# Patient Record
Sex: Male | Born: 1945 | Race: Black or African American | Hispanic: No | Marital: Married | Smoking: Former smoker
Health system: Southern US, Community
[De-identification: ages and names within clinical notes are randomized; demographics above are authoritative.]

## PROBLEM LIST (undated history)

## (undated) DIAGNOSIS — M5126 Other intervertebral disc displacement, lumbar region: Secondary | ICD-10-CM

## (undated) DIAGNOSIS — J309 Allergic rhinitis, unspecified: Secondary | ICD-10-CM

## (undated) DIAGNOSIS — K5904 Chronic idiopathic constipation: Secondary | ICD-10-CM

## (undated) DIAGNOSIS — K029 Dental caries, unspecified: Secondary | ICD-10-CM

## (undated) DIAGNOSIS — E785 Hyperlipidemia, unspecified: Secondary | ICD-10-CM

## (undated) DIAGNOSIS — I10 Essential (primary) hypertension: Secondary | ICD-10-CM

## (undated) DIAGNOSIS — M549 Dorsalgia, unspecified: Secondary | ICD-10-CM

## (undated) DIAGNOSIS — M109 Gout, unspecified: Secondary | ICD-10-CM

## (undated) DIAGNOSIS — M1990 Unspecified osteoarthritis, unspecified site: Secondary | ICD-10-CM

## (undated) DIAGNOSIS — K562 Volvulus: Secondary | ICD-10-CM

## (undated) DIAGNOSIS — I440 Atrioventricular block, first degree: Secondary | ICD-10-CM

## (undated) HISTORY — DX: Atrioventricular block, first degree: I44.0

## (undated) HISTORY — DX: Hyperlipidemia, unspecified: E78.5

## (undated) HISTORY — DX: Essential (primary) hypertension: I10

## (undated) HISTORY — DX: Other intervertebral disc displacement, lumbar region: M51.26

## (undated) HISTORY — DX: Volvulus: K56.2

## (undated) HISTORY — DX: Dental caries, unspecified: K02.9

## (undated) HISTORY — DX: Chronic idiopathic constipation: K59.04

## (undated) HISTORY — DX: Gout, unspecified: M10.9

## (undated) HISTORY — DX: Allergic rhinitis, unspecified: J30.9

## (undated) HISTORY — DX: Dorsalgia, unspecified: M54.9

---

## 2008-06-25 HISTORY — PX: COLONOSCOPY: SHX174

## 2009-06-25 DIAGNOSIS — K562 Volvulus: Secondary | ICD-10-CM

## 2009-06-25 HISTORY — DX: Volvulus: K56.2

## 2009-09-05 ENCOUNTER — Emergency Department: Payer: Self-pay | Admitting: Emergency Medicine

## 2010-01-10 ENCOUNTER — Emergency Department: Payer: Self-pay | Admitting: Emergency Medicine

## 2010-01-11 ENCOUNTER — Emergency Department: Payer: Self-pay | Admitting: Unknown Physician Specialty

## 2010-02-01 ENCOUNTER — Encounter: Payer: Self-pay | Admitting: Family Medicine

## 2010-02-22 ENCOUNTER — Ambulatory Visit: Payer: Self-pay | Admitting: Family Medicine

## 2010-02-23 ENCOUNTER — Encounter: Payer: Self-pay | Admitting: Family Medicine

## 2010-03-25 ENCOUNTER — Encounter: Payer: Self-pay | Admitting: Family Medicine

## 2010-04-25 ENCOUNTER — Encounter: Payer: Self-pay | Admitting: Family Medicine

## 2012-01-31 ENCOUNTER — Emergency Department: Payer: Self-pay | Admitting: Emergency Medicine

## 2012-01-31 LAB — COMPREHENSIVE METABOLIC PANEL
Albumin: 3.7 g/dL (ref 3.4–5.0)
Alkaline Phosphatase: 90 U/L (ref 50–136)
BUN: 24 mg/dL — ABNORMAL HIGH (ref 7–18)
Bilirubin,Total: 0.2 mg/dL (ref 0.2–1.0)
Creatinine: 1.34 mg/dL — ABNORMAL HIGH (ref 0.60–1.30)
EGFR (African American): >60
Glucose: 99 mg/dL (ref 65–99)
Osmolality: 291 (ref 275–301)
Sodium: 144 mmol/L (ref 136–145)
Total Protein: 7.1 g/dL (ref 6.4–8.2)

## 2012-01-31 LAB — CBC
HCT: 38.9 % — ABNORMAL LOW (ref 40.0–52.0)
HGB: 13.3 g/dL (ref 13.0–18.0)
MCV: 88 fL (ref 80–100)
Platelet: 146 10*3/uL — ABNORMAL LOW (ref 150–440)
RBC: 4.41 10*6/uL (ref 4.40–5.90)
WBC: 5.8 10*3/uL (ref 3.8–10.6)

## 2012-01-31 LAB — TROPONIN I: Troponin-I: <0.02 ng/mL

## 2012-01-31 LAB — CK TOTAL AND CKMB (NOT AT ARMC): CK-MB: 1.9 ng/mL (ref 0.5–3.6)

## 2012-02-01 ENCOUNTER — Emergency Department: Payer: Self-pay | Admitting: Emergency Medicine

## 2012-02-01 LAB — CBC
HCT: 40.3 % (ref 40.0–52.0)
HGB: 14 g/dL (ref 13.0–18.0)
MCH: 30.3 pg (ref 26.0–34.0)
MCHC: 34.6 g/dL (ref 32.0–36.0)
Platelet: 149 10*3/uL — ABNORMAL LOW (ref 150–440)
RBC: 4.61 10*6/uL (ref 4.40–5.90)

## 2012-02-01 LAB — TROPONIN I
Troponin-I: <0.02 ng/mL
Troponin-I: <0.02 ng/mL

## 2012-02-01 LAB — CK TOTAL AND CKMB (NOT AT ARMC)
CK, Total: 172 U/L (ref 35–232)
CK-MB: 1.5 ng/mL (ref 0.5–3.6)

## 2012-02-01 LAB — URINALYSIS, COMPLETE
Blood: NEGATIVE
Ketone: NEGATIVE
Leukocyte Esterase: NEGATIVE
Ph: 5 (ref 4.5–8.0)
Protein: NEGATIVE
RBC,UR: 1 /HPF (ref 0–5)

## 2012-02-01 LAB — COMPREHENSIVE METABOLIC PANEL
Anion Gap: 6 — ABNORMAL LOW (ref 7–16)
BUN: 17 mg/dL (ref 7–18)
Calcium, Total: 10.1 mg/dL (ref 8.5–10.1)
Chloride: 105 mmol/L (ref 98–107)
Co2: 33 mmol/L — ABNORMAL HIGH (ref 21–32)
EGFR (African American): >60
EGFR (Non-African Amer.): 55 — ABNORMAL LOW
Potassium: 4.2 mmol/L (ref 3.5–5.1)
SGOT(AST): 26 U/L (ref 15–37)
SGPT (ALT): 30 U/L (ref 12–78)

## 2012-02-01 LAB — PRO B NATRIURETIC PEPTIDE: B-Type Natriuretic Peptide: 55 pg/mL (ref 0–125)

## 2012-02-04 ENCOUNTER — Encounter: Payer: Self-pay | Admitting: Cardiovascular Disease

## 2012-02-05 ENCOUNTER — Encounter: Payer: Self-pay | Admitting: Cardiovascular Disease

## 2012-02-07 ENCOUNTER — Encounter: Payer: Self-pay | Admitting: Cardiovascular Disease

## 2012-04-23 ENCOUNTER — Inpatient Hospital Stay: Payer: Self-pay | Admitting: Internal Medicine

## 2012-04-23 LAB — COMPREHENSIVE METABOLIC PANEL
Albumin: 3.8 g/dL (ref 3.4–5.0)
Alkaline Phosphatase: 89 U/L (ref 50–136)
Anion Gap: 7 (ref 7–16)
BUN: 22 mg/dL — ABNORMAL HIGH (ref 7–18)
Calcium, Total: 8.7 mg/dL (ref 8.5–10.1)
Creatinine: 1.28 mg/dL (ref 0.60–1.30)
EGFR (African American): >60
EGFR (Non-African Amer.): 58 — ABNORMAL LOW
Glucose: 170 mg/dL — ABNORMAL HIGH (ref 65–99)
Osmolality: 290 (ref 275–301)
Potassium: 4.4 mmol/L (ref 3.5–5.1)
Sodium: 142 mmol/L (ref 136–145)
Total Protein: 7.3 g/dL (ref 6.4–8.2)

## 2012-04-23 LAB — CBC WITH DIFFERENTIAL/PLATELET
Basophil #: 0.1 10*3/uL (ref 0.0–0.1)
Basophil %: 0.6 %
HGB: 13.2 g/dL (ref 13.0–18.0)
Lymphocyte #: 1.2 10*3/uL (ref 1.0–3.6)
MCV: 90 fL (ref 80–100)
Monocyte #: 1.1 x10 3/mm — ABNORMAL HIGH (ref 0.2–1.0)
Monocyte %: 11.3 %
Platelet: 132 10*3/uL — ABNORMAL LOW (ref 150–440)
RDW: 13.9 % (ref 11.5–14.5)
WBC: 9.7 10*3/uL (ref 3.8–10.6)

## 2012-04-23 LAB — HEMOGLOBIN A1C: Hemoglobin A1C: 6.9 % — ABNORMAL HIGH (ref 4.2–6.3)

## 2012-04-24 LAB — CBC WITH DIFFERENTIAL/PLATELET
Basophil #: 0 10*3/uL (ref 0.0–0.1)
Basophil %: 0.3 %
Eosinophil %: 2.7 %
HCT: 37.7 % — ABNORMAL LOW (ref 40.0–52.0)
Lymphocyte #: 0.8 10*3/uL — ABNORMAL LOW (ref 1.0–3.6)
MCH: 30 pg (ref 26.0–34.0)
MCHC: 33.8 g/dL (ref 32.0–36.0)
MCV: 89 fL (ref 80–100)
Monocyte %: 12.4 %
Neutrophil #: 5.6 10*3/uL (ref 1.4–6.5)
RBC: 4.25 10*6/uL — ABNORMAL LOW (ref 4.40–5.90)
RDW: 14.1 % (ref 11.5–14.5)

## 2012-04-28 LAB — CULTURE, BLOOD (SINGLE)

## 2013-10-14 ENCOUNTER — Encounter: Payer: Self-pay | Admitting: Family Medicine

## 2013-10-23 ENCOUNTER — Encounter: Payer: Self-pay | Admitting: Family Medicine

## 2013-11-23 ENCOUNTER — Encounter: Payer: Self-pay | Admitting: Family Medicine

## 2014-10-12 NOTE — Consult Note (Signed)
PATIENT NAME:  Dillon Schultz, Dillon Schultz MR#:  [id number] DATE OF BIRTH:  02/12/1946  DATE OF CONSULTATION:  04/23/2012  CONSULTING PHYSICIAN:  Laurene Footman, MD  REASON FOR CONSULTATION: Left finger infection.   HISTORY OF PRESENT ILLNESS: The patient is a 69 year old who has been having increasing pain in the left hand. He had what he thinks was a bug bite. He had swelling to the finger. He got worse despite oral antibiotics and came into the Emergency Room where he was found to have significant swelling and increasing stiffness. He was admitted for this today. He is seen in consult in case he needs surgical intervention.   PHYSICAL EXAMINATION: On exam he holds the finger in slight flexion about 30 degrees flexion at the PIP joint. He said he could straighten the finger fully prior to this infection. He has an approximately 3 to 4 mm area that appears to be a prior bug bite or puncture wound to the dorsal ulnar aspect of the ring finger close to the PIP joint. There is moderate swelling to the finger. He can be passively flexed and extended without a great deal of pain up until the point where in flexion and extension he has a great deal of pain. Sensation is intact.   RADIOLOGICAL DATA: X-rays were ordered and interpreted. AP, lateral, oblique of the left ring finger shows no evidence of foreign body or osteomyelitis.   CLINICAL IMPRESSION: Cellulitis left ring finger.  RECOMMENDATIONS: I agree with current antibiotics. If he develops abscess or flexor tendon sheath infection, he may require incision and drainage and irrigation of any abscess that might form; but clinically at this time there is no area of fluctuance that could be drained, and there is just cellulitis.   I discussed this with the patient. He is very anxious about potential loss of finger. I told him that with appropriate antibiotics and surgical care, if needed, that should not be necessary. I will continue to follow him during his  hospitalization.  ____________________________ Laurene Footman, MD [id number] D: 04/23/2012 16:48:07 ET T: 04/23/2012 16:56:34 ET JOB#: [id number]  Christian Mate Galaxy Borden MD ELECTRONICALLY SIGNED 04/23/2012 21:12

## 2014-10-12 NOTE — Consult Note (Signed)
Brief Consult Note: Diagnosis: cellulitis left ring finger.   Patient was seen by consultant.   Orders entered.   Comments: xray ordered ot make sure there is no foreign body. will continiue to follwo, may need incision and drainage if abscess forms.  Electronic Signatures: Laurene Footman (MD)  (Signed 30-Oct-13 12:50)  Authored: Brief Consult Note   Last Updated: 30-Oct-13 12:50 by Laurene Footman (MD)

## 2014-10-12 NOTE — H&P (Signed)
PATIENT NAME:  Dillon, Schultz MR#:  [id number] DATE OF BIRTH:  [birth year]  DATE OF ADMISSION:  04/23/2012  PRIMARY CARE PROVIDER: Dr. Salome Holmes   CHIEF COMPLAINT: Left fourth finger swelling and erythema.   HISTORY OF PRESENT ILLNESS: The patient is a 69 year old male with history of hypertension, hyperlipidemia, question of borderline diabetes mellitus who presents with left fourth finger swelling and erythema. The patient was in his usual state of health until two days prior to presentation when he noticed a small lesion on his left fourth finger he presumed to be an insect bite. The area progressively became more erythematous and swollen so he presented to the his primary care provider and was started on clindamycin in the outpatient setting. Despite antibiotics, the finger continued to swell and became significantly erythematous and painful so the patient presented to the Emergency Department. He has received a dose of vancomycin thus far. He denies any fevers or chills. He notes that intermittently from the wound he is only able to express some clear fluid. No frank purulent material has been expressed from the site.   PAST MEDICAL HISTORY:  1. 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  2. Hyperlipidemia.  3. Hypertension.  4. Gout.  5. Chronic venostasis.   PAST SURGICAL HISTORY: Denies.   ALLERGIES: No known drug allergies.   HOME MEDICATIONS:  1. Amlodipine 10 mg daily.  2. Crestor 10 mg daily at bedtime.  3. Ibuprofen 800 mg 3 times a day.  4. Indomethacin 50 mg daily (the patient takes this as needed. He notes that his gout exacerbations are about once or twice yearly).  5. Lasix 20 mg daily.  6. Lisinopril 40 mg daily.   FAMILY HISTORY: Father had unknown cancer. There is also a family history of diabetes and hypertension.   SOCIAL HISTORY: Denies tobacco or illicit drug use or alcohol. He is retired from a [employer] in [place], [place].   [place]:  CONSTITUTIONAL: Denies fever, fatigue. EYES: Denies blurred vision or eye pain. ENT: Denies tinnitus or ear pain. RESPIRATORY: Denies cough, dyspnea. CARDIOVASCULAR: Denies chest pain. Admits to chronic lower extremity edema. Denies any palpitations. GI: Denies nausea, vomiting, abdominal pain, or melena. GU: Denies dysuria, frequency. INTEGUMENTARY: Admits to new lesion on left fourth finger. MUSCULOSKELETAL: Admits to arthritis and gout. NEUROLOGICAL: Denies numbness or headache. PSYCH: Denies anxiety or depression.   PHYSICAL EXAMINATION:   VITAL SIGNS: Temperature 98.1, pulse 88, respirations 20, blood pressure 129/79, sating 95% on room air.   GENERAL: Elderly African American male resting comfortably on the stretcher in no apparent distress.   EYES: There is arcus senilis. Anicteric sclerae. Pupils equal, round, and reactive to light.   ENT: Normal external ears and nares. Posterior pharynx is clear without erythema or exudate.   CARDIAC: S1, S2, regular rate and rhythm. No murmurs. There is 1 to 2+ pitting edema in bilateral lower extremities. This is chronic. Pulses are equal bilaterally.   RESPIRATORY: Clear to auscultation bilaterally. No wheeze, rales, or rhonchi. Normal effort.   ABDOMEN: Soft, nontender, nondistended. Rotunded. No hepatosplenomegaly noted.   MUSCULOSKELETAL: Full range of motion in all extremities. No clubbing or cyanosis.   SKIN: Warm and dry.There is marked tenderness, swelling, and erythema over the left fourth finger extending from the MCP to the DIP. There is some erythema on the palmar aspect. There is no lymphadenitis in the hand. Sensation is intact. There is no fluctuance. The finger is significantly swollen.   PSYCH: The patient is alert  and oriented x3. Judgment is intact.   LABORATORY DATA: Glucose 170, BUN 22, creatinine 1.28, sodium 142, potassium 4.4, chloride 105, bicarb 30, anion gap 7, osmolality 290 with a calcium of 8.7, total protein 7.3,  albumin 3.8, bilirubin 0.3, alkaline phosphatase 89, AST 29, ALT 32, WBC count 9.7, hemoglobin 13.2, hematocrit 39.2, platelets 132, MCV 90.   ASSESSMENT AND PLAN: This is a 69 year old male with history of hypertension, hyperlipidemia, and possible borderline diabetes mellitus presenting with left finger cellulitis refractory to outpatient antibiotics.  1. Left fourth finger cellulitis. The patient has received vancomycin in the Emergency Department. Will continue vancomycin with clindamycin to cover for any anaerobic agents as well as Streptococcus. Will check blood and wound cultures. Continue p.r.n. pain medications. I think it would be reasonable to obtain an orthopedic consult in case incision and drainage is needed. At this time the wound is indurated but not fluctuant and I was unable to express any purulent material. Will check a hemoglobin A1c and if uncontrolled will initiate hyperglycemic medications.  2. Hypertension, stable. Will continue his home medications of lisinopril and Amlodipine.  3. Chronic venostasis. Will continue his Lasix.  4. Gout, stable at this time. Will hold his indomethacin. He does not take his routinely.  5. Hyperlipidemia. Will continue his statin.  6. CODE STATUS. FULL. His surrogate decision maker is his wife, Dillon Schultz, or his son, Dillon Schultz.  7. Prophylaxis. Lovenox.   DISPOSITION: The patient is being admitted inpatient for intravenous antibiotics for his cellulitis.   TIME SPENT ON ADMISSION: 40 minutes.   ____________________________ Samson Frederic, DO aeo:drc D: 04/23/2012 05:29:02 ET T: 04/23/2012 07:22:27 ET JOB#: [id number]  cc: Samson Frederic, DO, <Dictator>, Salome Holmes, MD [place] SIGNED 04/27/2012 6:39

## 2014-10-12 NOTE — Discharge Summary (Signed)
PATIENT NAME:  Dillon Schultz, CREPEAU MR#:  [id number] DATE OF BIRTH:  1946/01/21  DATE OF ADMISSION:  04/23/2012 DATE OF DISCHARGE:  04/24/2012  CONSULTATION: Dr. Rudene Christians.  DISCHARGE DIAGNOSES:  1. Left fourth finger cellulitis, questionable abscess.  2. Hypertension.  3. Diabetes.  4. Hyperlipidemia.   CONDITION: Stable.   CODE STATUS: FULL CODE.   HOME MEDICATIONS:  1. Lisinopril 40 mg p.o. daily.  2. Crestor 10 mg p.o. at bedtime. 3. Lasix 20 mg p.o. daily.  4. Norvasc 10 mg p.o. daily.  5. Ibuprofen 800 mg p.o. 3 times daily p.r.n.  6. Clindamycin 300 mg capsule 2 caps every eight hours for 10 days.  STOP MEDICATION: Indomethacin 50 mg p.o. daily.   DIET: Low sodium, ADA diet.   ACTIVITY: As tolerated.   FOLLOW-UP CARE: Follow up with PCP within 1 to 2 weeks. Follow up Dr. Rudene Christians within 1 week.   REASON FOR ADMISSION: Left fourth finger swelling and erythema.   HOSPITAL COURSE: Patient is a 69 year old African American male with history of hypertension, hyperlipidemia, questionable borderline diabetes presented to ED with left fourth finger swelling and erythema. He was treated with clindamycin as outpatient. Despite antibiotics the finger continued to swell and became significant erythematosus and painful so patient came to ED for further evaluation. He received vancomycin and admitted for cellulitis. After admission patient has been treated with vancomycin and clindamycin IVPB. Blood culture so far is negative. Dr. Rudene Christians evaluated patient, suggested continue IV antibiotics. Patient may need incision and drainage but later, but not at this time. Patient's finger x-ray showed no evidence of acute abnormality. Patient really wants to go home today. I advised the patient follow up with PCP and Dr. Rudene Christians and come to ED if the symptoms get worse. Patient's hemoglobin is 6.9. Patient will be discharged home today. Discussed the patient's discharge plan with the patient and the case manager.   TIME  SPENT: About 33 minutes.   ____________________________ Demetrios Loll, MD qc:cms D: 04/24/2012 16:14:31 ET T: 04/25/2012 08:25:46 ET JOB#: [id number]  cc: Demetrios Loll, MD, <Dictator> Demetrios Loll MD ELECTRONICALLY SIGNED 04/30/2012 16:51

## 2015-11-11 ENCOUNTER — Other Ambulatory Visit: Payer: Self-pay

## 2015-11-11 ENCOUNTER — Telehealth: Payer: Self-pay

## 2015-11-11 MED ORDER — PEG 3350-KCL-NABCB-NACL-NASULF 236 G PO SOLR: 4000.0000 mL | Freq: Once | ORAL | Status: DC

## 2015-11-11 NOTE — Telephone Encounter (Signed)
Gastroenterology Pre-Procedure Review  Request Date: 12/16/15 Requesting Physician: Dr. Clide Deutscher - Princella Ion   PATIENT REVIEW QUESTIONS: The patient responded to the following health history questions as indicated:    1. Are you having any GI issues? no 2. Do you have a personal history of Polyps? no 3. Do you have a family history of Colon Cancer or Polyps? no 4. Diabetes Mellitus? no 5. Joint replacements in the past 12 months?no 6. Major health problems in the past 3 months?no 7. Any artificial heart valves, MVP, or defibrillator?no    MEDICATIONS & ALLERGIES:    Patient reports the following regarding taking any anticoagulation/antiplatelet therapy:   Plavix, Coumadin, Eliquis, Xarelto, Lovenox, Pradaxa, Brilinta, or Effient? no Aspirin? no  Patient confirms/reports the following medications:  Current Outpatient Prescriptions  Medication Sig Dispense Refill  . amLODipine (NORVASC) 10 MG tablet Take 10 mg by mouth daily.    Marland Kitchen azelastine (ASTELIN) 137 MCG/SPRAY nasal spray Place 2 sprays into the nose 2 (two) times daily. Use in each nostril as directed    . cetirizine (ZYRTEC) 10 MG tablet Take 10 mg by mouth daily.    . furosemide (LASIX) 20 MG tablet Take 20 mg by mouth daily.    Marland Kitchen lisinopril (PRINIVIL,ZESTRIL) 40 MG tablet Takes 2 tablets daily.     No current facility-administered medications for this visit.    Patient confirms/reports the following allergies:  Allergies  Allergen Reactions  . Glucophage [Metformin Hcl]     No orders of the defined types were placed in this encounter.    AUTHORIZATION INFORMATION Primary Insurance: 1D#: Group #:  Secondary Insurance: 1D#: Group #:  SCHEDULE INFORMATION: Date: 12/16/15 Time: Location: [facility]

## 2015-11-25 ENCOUNTER — Encounter: Payer: Self-pay | Admitting: Emergency Medicine

## 2015-11-25 ENCOUNTER — Emergency Department
Admission: EM | Admit: 2015-11-25 | Discharge: 2015-11-25 | Disposition: A | Discharge status: Home / Self Care | Payer: Medicare Other | Attending: Emergency Medicine | Admitting: Emergency Medicine

## 2015-11-25 DIAGNOSIS — Z87891 Personal history of nicotine dependence: Secondary | ICD-10-CM | POA: Diagnosis not present

## 2015-11-25 DIAGNOSIS — Z79899 Other long term (current) drug therapy: Secondary | ICD-10-CM | POA: Diagnosis not present

## 2015-11-25 DIAGNOSIS — K439 Ventral hernia without obstruction or gangrene: Secondary | ICD-10-CM | POA: Diagnosis not present

## 2015-11-25 DIAGNOSIS — E785 Hyperlipidemia, unspecified: Secondary | ICD-10-CM | POA: Insufficient documentation

## 2015-11-25 DIAGNOSIS — I10 Essential (primary) hypertension: Secondary | ICD-10-CM | POA: Insufficient documentation

## 2015-11-25 DIAGNOSIS — M5126 Other intervertebral disc displacement, lumbar region: Secondary | ICD-10-CM | POA: Insufficient documentation

## 2015-11-25 DIAGNOSIS — R1033 Periumbilical pain: Secondary | ICD-10-CM | POA: Diagnosis present

## 2015-11-25 LAB — CBC
HEMATOCRIT: 38.5 % — AB (ref 40.0–52.0)
HEMOGLOBIN: 12.9 g/dL — AB (ref 13.0–18.0)
MCH: 29.5 pg (ref 26.0–34.0)
MCHC: 33.5 g/dL (ref 32.0–36.0)
MCV: 88 fL (ref 80.0–100.0)
Platelets: 147 10*3/uL — ABNORMAL LOW (ref 150–440)
RBC: 4.38 MIL/uL — AB (ref 4.40–5.90)
RDW: 13.5 % (ref 11.5–14.5)
WBC: 5.7 10*3/uL (ref 3.8–10.6)

## 2015-11-25 LAB — COMPREHENSIVE METABOLIC PANEL
ALT: 12 U/L — AB (ref 17–63)
AST: 20 U/L (ref 15–41)
Albumin: 4.1 g/dL (ref 3.5–5.0)
Alkaline Phosphatase: 57 U/L (ref 38–126)
Anion gap: 9 (ref 5–15)
BUN: 23 mg/dL — ABNORMAL HIGH (ref 6–20)
CHLORIDE: 104 mmol/L (ref 101–111)
CO2: 28 mmol/L (ref 22–32)
CREATININE: 1.36 mg/dL — AB (ref 0.61–1.24)
Calcium: 9.5 mg/dL (ref 8.9–10.3)
GFR calc non Af Amer: 51 mL/min — ABNORMAL LOW (ref >60)
GFR, EST AFRICAN AMERICAN: 59 mL/min — AB (ref >60)
Glucose, Bld: 103 mg/dL — ABNORMAL HIGH (ref 65–99)
POTASSIUM: 3.6 mmol/L (ref 3.5–5.1)
SODIUM: 141 mmol/L (ref 135–145)
Total Bilirubin: 0.5 mg/dL (ref 0.3–1.2)
Total Protein: 7.2 g/dL (ref 6.5–8.1)

## 2015-11-25 LAB — LIPASE, BLOOD: LIPASE: 25 U/L (ref 11–51)

## 2015-11-25 MED ORDER — DOCUSATE SODIUM 100 MG PO CAPS: 200.0000 mg | ORAL_CAPSULE | Freq: Two times a day (BID) | ORAL | Status: DC

## 2015-11-25 NOTE — Discharge Instructions (Signed)
Hernia, Adult °A hernia is the bulging of an organ or tissue through a weak spot in the muscles of the abdomen (abdominal wall). Hernias develop most often near the navel or groin. °There are many kinds of hernias. Common kinds include: °· Femoral hernia. This kind of hernia develops under the groin in the upper thigh area. °· Inguinal hernia. This kind of hernia develops in the groin or scrotum. °· Umbilical hernia. This kind of hernia develops near the navel. °· Hiatal hernia. This kind of hernia causes part of the stomach to be pushed up into the chest. °· Incisional hernia. This kind of hernia bulges through a scar from an abdominal surgery. °CAUSES °This condition may be caused by: °· Heavy lifting. °· Coughing over a long period of time. °· Straining to have a bowel movement. °· An incision made during an abdominal surgery. °· A birth defect (congenital defect). °· Excess weight or obesity. °· Smoking. °· Poor nutrition. °· Cystic fibrosis. °· Excess fluid in the abdomen. °· Undescended testicles. °SYMPTOMS °Symptoms of a hernia include: °· A lump on the abdomen. This is the first sign of a hernia. The lump may become more obvious with standing, straining, or coughing. It may get bigger over time if it is not treated or if the condition causing it is not treated. °· Pain. A hernia is usually painless, but it may become painful over time if treatment is delayed. The pain is usually dull and may get worse with standing or lifting heavy objects. °Sometimes a hernia gets tightly squeezed in the weak spot (strangulated) or stuck there (incarcerated) and causes additional symptoms. These symptoms may include: °· Vomiting. °· Nausea. °· Constipation. °· Irritability. °DIAGNOSIS °A hernia may be diagnosed with: °· A physical exam. During the exam your health care provider may ask you to cough or to make a specific movement, because a hernia is usually more visible when you move. °· Imaging tests. These can  include: °· X-rays. °· Ultrasound. °· CT scan. °TREATMENT °A hernia that is small and painless may not need to be treated. A hernia that is large or painful may be treated with surgery. Inguinal hernias may be treated with surgery to prevent incarceration or strangulation. Strangulated hernias are always treated with surgery, because lack of blood to the trapped organ or tissue can cause it to die. °Surgery to treat a hernia involves pushing the bulge back into place and repairing the weak part of the abdomen. °HOME CARE INSTRUCTIONS °· Avoid straining. °· Do not lift anything heavier than 10 lb (4.5 kg). °· Lift with your leg muscles, not your back muscles. This helps avoid strain. °· When coughing, try to cough gently. °· Prevent constipation. Constipation leads to straining with bowel movements, which can make a hernia worse or cause a hernia repair to break down. You can prevent constipation by: °· Eating a high-fiber diet that includes plenty of fruits and vegetables. °· Drinking enough fluids to keep your urine clear or pale yellow. Aim to drink 6-8 glasses of water per day. °· Using a stool softener as directed by your health care provider. °· Lose weight, if you are overweight. °· Do not use any tobacco products, including cigarettes, chewing tobacco, or electronic cigarettes. If you need help quitting, ask your health care provider. °· Keep all follow-up visits as directed by your health care provider. This is important. Your health care provider may need to monitor your condition. °SEEK MEDICAL CARE IF: °· You have   swelling, redness, and pain in the affected area. °· Your bowel habits change. °SEEK IMMEDIATE MEDICAL CARE IF: °· You have a fever. °· You have abdominal pain that is getting worse. °· You feel nauseous or you vomit. °· You cannot push the hernia back in place by gently pressing on it while you are lying down. °· The hernia: °¨ Changes in shape or size. °¨ Is stuck outside the  abdomen. °¨ Becomes discolored. °¨ Feels hard or tender. °  °This information is not intended to replace advice given to you by your health care provider. Make sure you discuss any questions you have with your health care provider. °  °Document Released: 06/11/2005 Document Revised: 07/02/2014 Document Reviewed: 04/21/2014 °Elsevier Interactive Patient Education ©2016 [employer] °Ventral Hernia °A ventral hernia (also called an incisional hernia) is a hernia that occurs at the site of a previous surgical cut (incision) in the abdomen. The abdominal wall spans from your lower chest down to your pelvis. If the abdominal wall is weakened from a surgical incision, a hernia can occur. A hernia is a bulge of bowel or muscle tissue pushing out on the weakened part of the abdominal wall. Ventral hernias can get bigger from straining or lifting. °Obese and older people are at higher risk for a ventral hernia. People who develop infections after surgery or require repeat incisions at the same site on the abdomen are also at increased risk. °CAUSES  °A ventral hernia occurs because of weakness in the abdominal wall at an incision site.  °SYMPTOMS  °Common symptoms include: °· A visible bulge or lump on the abdominal wall. °· Pain or tenderness around the lump. °· Increased discomfort if you cough or make a sudden movement. °If the hernia has blocked part of the intestine, a serious complication can occur (incarcerated or strangulated hernia). This can become a problem that requires emergency surgery because the blood flow to the blocked intestine may be cut off. Symptoms may include: °· Feeling sick to your stomach (nauseous). °· Throwing up (vomiting). °· Stomach swelling (distention) or bloating. °· Fever. °· Rapid heartbeat. °DIAGNOSIS  °Your health care provider will take a medical history and perform a physical exam. Various tests may be ordered, such as: °· Blood tests. °· Urine  tests. °· Ultrasonography. °· X-rays. °· Computed tomography (CT). °TREATMENT  °Watchful waiting may be all that is needed for a smaller hernia that does not cause symptoms. Your health care provider may recommend the use of a supportive belt (truss) that helps to keep the abdominal wall intact. For larger hernias or those that cause pain, surgery to repair the hernia is usually recommended. If a hernia becomes strangulated, emergency surgery needs to be done right away. °HOME CARE INSTRUCTIONS °· Avoid putting pressure or strain on the abdominal area. °· Avoid heavy lifting. °· Use good body positioning for physical tasks. Ask your health care provider about proper body positioning. °· Use a supportive belt as directed by your health care provider. °· Maintain a healthy weight. °· Eat foods that are high in fiber, such as whole grains, fruits, and vegetables. Fiber helps prevent difficult bowel movements (constipation). °· Drink enough fluids to keep your urine clear or pale yellow. °· Follow up with your health care provider as directed. °SEEK MEDICAL CARE IF:  °· Your hernia seems to be getting larger or more painful. °SEEK IMMEDIATE MEDICAL CARE IF:  °· You have abdominal pain that is sudden and sharp. °· Your pain becomes   severe. °· You have repeated vomiting. °· You are sweating a lot. °· You notice a rapid heartbeat. °· You develop a fever. °MAKE SURE YOU:  °· Understand these instructions. °· Will watch your condition. °· Will get help right away if you are not doing well or get worse. °  °This information is not intended to replace advice given to you by your health care provider. Make sure you discuss any questions you have with your health care provider. °  °Document Released: 05/28/2012 Document Revised: 07/02/2014 Document Reviewed: 05/28/2012 °Elsevier Interactive Patient Education ©2016 [employer] ° °

## 2015-11-25 NOTE — ED Provider Notes (Signed)
[facility] Emergency Department Provider Note  ____________________________________________  Time seen: 6:40 PM  I have reviewed the triage vital signs and the nursing notes.   HISTORY  Chief Complaint Abdominal Pain    HPI Dillon Schultz is a 70 y.o. male who is awake and in the middle night by severe abdominal pain the middle near his umbilicus. This been constant all day since about 1 AM. Has been able to eat. No constipation. No vomiting. Never had anything like this before. Noticed a bulge in the abdominal wall. Nonradiating, severe, waxing and waning. Currently moderate intensity. Worse with movement.     Past Medical History  Diagnosis Date  . Hyperlipidemia   . Hypertension   . Gout   . Lumbar herniated disc   . 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 Functional constipation   . 1St degree AV block   . Back pain   . Dental caries   . Allergic rhinitis, cause unspecified      There are no active problems to display for this patient.    Past Surgical History  Procedure Laterality Date  . Colonoscopy  2010     Current Outpatient Rx  Name  Route  Sig  Dispense  Refill  . amLODipine (NORVASC) 10 MG tablet   Oral   Take 10 mg by mouth daily.         Marland Kitchen azelastine (ASTELIN) 137 MCG/SPRAY nasal spray   Nasal   Place 2 sprays into the nose 2 (two) times daily. Use in each nostril as directed         . cetirizine (ZYRTEC) 10 MG tablet   Oral   Take 10 mg by mouth daily.         Marland Kitchen docusate sodium (COLACE) 100 MG capsule   Oral   Take 2 capsules (200 mg total) by mouth 2 (two) times daily.   120 capsule   0   . furosemide (LASIX) 20 MG tablet   Oral   Take 20 mg by mouth daily.         Marland Kitchen lisinopril (PRINIVIL,ZESTRIL) 40 MG tablet      Takes 2 tablets daily.         . polyethylene glycol (GOLYTELY) 236 g solution   Oral   Take 4,000 mLs by mouth once. Drink one 8 oz glass every 30 mins until stools are clear.   4000 mL   0       Allergies Glucophage   History reviewed. No pertinent family history.  Social History Social History  Substance Use Topics  . Smoking status: Former Smoker -- 5 years    Types: Cigarettes    Quit date: 06/26/1967  . Smokeless tobacco: None  . Alcohol Use: None    Review of Systems  Constitutional:   No fever or chills.  Eyes:   No vision changes.  ENT:   No sore throat. No rhinorrhea. Cardiovascular:   No chest pain. Respiratory:   No dyspnea or cough. Gastrointestinal:   Positive for abdominal pain as above without vomiting and diarrhea.  Genitourinary:   Negative for dysuria or difficulty urinating. Musculoskeletal:   Negative for focal pain or swelling Neurological:   Negative for headaches 10-point ROS otherwise negative.  ____________________________________________   PHYSICAL EXAM:  VITAL SIGNS: ED Triage Vitals  Enc Vitals Group     BP 11/25/15 1549 98/65 mmHg     Pulse Rate 11/25/15 1549 76     Resp 11/25/15 1549 18  Temp 11/25/15 1549 98.1 F (36.7 C)     Temp Source 11/25/15 1549 Oral     SpO2 11/25/15 1549 98 %     Weight 11/25/15 1549 206 lb (93.441 kg)     Height 11/25/15 1549 5\' 7"  (1.702 m)     Head Cir --      Peak Flow --      Pain Score 11/25/15 1550 9     Pain Loc --      Pain Edu? --      Excl. in [place]? --     Vital signs reviewed, nursing assessments reviewed.   Constitutional:   Alert and oriented. Well appearing and in no distress. Eyes:   No scleral icterus. No conjunctival pallor. PERRL. EOMI.  No nystagmus. ENT   Head:   Normocephalic and atraumatic.   Nose:   No congestion/rhinnorhea. No septal hematoma   Mouth/Throat:   MMM, no pharyngeal erythema. No peritonsillar mass.    Neck:   No stridor. No SubQ emphysema. No meningismus. Hematological/Lymphatic/Immunilogical:   No cervical lymphadenopathy. Cardiovascular:   RRR. Symmetric bilateral radial and DP pulses.  No murmurs.  Respiratory:   Normal  respiratory effort without tachypnea nor retractions. Breath sounds are clear and equal bilaterally. No wheezes/rales/rhonchi. Gastrointestinal:   Soft With 2 cm hernia sac just superior to the umbilicus, easily reducible. This relieved the patient's pain. Otherwise abdomen is nontender. Non distended. There is no CVA tenderness.  No rebound, rigidity, or guarding. After reducing the hernia there is a 1-2 cm fascial defect in the midline just superior to the umbilicus. Genitourinary:   deferred Musculoskeletal:   Nontender with normal range of motion in all extremities. No joint effusions.  No lower extremity tenderness.  No edema. Neurologic:   Normal speech and language.  CN 2-10 normal. Motor grossly intact. No gross focal neurologic deficits are appreciated.  Skin:    Skin is warm, dry and intact. No rash noted.  No petechiae, purpura, or bullae.  ____________________________________________    LABS (pertinent positives/negatives) (all labs ordered are listed, but only abnormal results are displayed) Labs Reviewed  COMPREHENSIVE METABOLIC PANEL - Abnormal; Notable for the following:    Glucose, Bld 103 (*)    BUN 23 (*)    Creatinine, Ser 1.36 (*)    ALT 12 (*)    GFR calc non Af Amer 51 (*)    GFR calc Af Amer 59 (*)    All other components within normal limits  CBC - Abnormal; Notable for the following:    RBC 4.38 (*)    Hemoglobin 12.9 (*)    HCT 38.5 (*)    Platelets 147 (*)    All other components within normal limits  LIPASE, BLOOD  URINALYSIS COMPLETEWITH MICROSCOPIC ([facility] ONLY)   ____________________________________________   EKG    ____________________________________________    RADIOLOGY    ____________________________________________   PROCEDURES Ventral hernia reduction by me at bedside, from pressure, easily reduced, no complications.  ____________________________________________   INITIAL IMPRESSION / ASSESSMENT AND PLAN / ED  COURSE  Pertinent labs & imaging results that were available during my care of the patient were reviewed by me and considered in my medical decision making (see chart for details).  Patient well appearing no acute distress. Presents with a small ventral hernia, easily reduced. This is the first time this is ever happened. We'll discharge home, started on stool softener, follow up with general surgery clinic. Return precautions for worsening hernia severe pain  vomiting or irreducible mass.     ____________________________________________   FINAL CLINICAL IMPRESSION(S) / ED DIAGNOSES  Final diagnoses:  Ventral hernia, recurrence not specified       Portions of this note were generated with dragon dictation software. Dictation errors may occur despite best attempts at proofreading.   Carrie Mew, MD 11/25/15 [phone]

## 2015-11-25 NOTE — ED Notes (Signed)
Patient c/o pain to the umbilical area starting yesterday

## 2015-12-15 NOTE — Discharge Instructions (Signed)

## 2015-12-16 ENCOUNTER — Encounter
Admission: RE | Disposition: A | Discharge status: Home / Self Care | Payer: Self-pay | Source: Ambulatory Visit | Attending: Gastroenterology

## 2015-12-16 ENCOUNTER — Ambulatory Visit: Payer: Medicare Other | Admitting: Anesthesiology

## 2015-12-16 ENCOUNTER — Ambulatory Visit
Admission: RE | Admit: 2015-12-16 | Discharge: 2015-12-16 | Disposition: A | Discharge status: Home / Self Care | Payer: Medicare Other | Source: Ambulatory Visit | Attending: Gastroenterology | Admitting: Gastroenterology

## 2015-12-16 DIAGNOSIS — D122 Benign neoplasm of ascending colon: Secondary | ICD-10-CM | POA: Diagnosis not present

## 2015-12-16 DIAGNOSIS — Z1211 Encounter for screening for malignant neoplasm of colon: Secondary | ICD-10-CM | POA: Diagnosis not present

## 2015-12-16 DIAGNOSIS — D120 Benign neoplasm of cecum: Secondary | ICD-10-CM | POA: Insufficient documentation

## 2015-12-16 DIAGNOSIS — D123 Benign neoplasm of transverse colon: Secondary | ICD-10-CM | POA: Diagnosis not present

## 2015-12-16 DIAGNOSIS — I10 Essential (primary) hypertension: Secondary | ICD-10-CM | POA: Diagnosis not present

## 2015-12-16 DIAGNOSIS — E785 Hyperlipidemia, unspecified: Secondary | ICD-10-CM | POA: Insufficient documentation

## 2015-12-16 DIAGNOSIS — M1990 Unspecified osteoarthritis, unspecified site: Secondary | ICD-10-CM | POA: Diagnosis not present

## 2015-12-16 DIAGNOSIS — D124 Benign neoplasm of descending colon: Secondary | ICD-10-CM | POA: Diagnosis not present

## 2015-12-16 DIAGNOSIS — K635 Polyp of colon: Secondary | ICD-10-CM | POA: Insufficient documentation

## 2015-12-16 DIAGNOSIS — Z79899 Other long term (current) drug therapy: Secondary | ICD-10-CM | POA: Diagnosis not present

## 2015-12-16 DIAGNOSIS — D125 Benign neoplasm of sigmoid colon: Secondary | ICD-10-CM | POA: Diagnosis not present

## 2015-12-16 DIAGNOSIS — Z87891 Personal history of nicotine dependence: Secondary | ICD-10-CM | POA: Diagnosis not present

## 2015-12-16 DIAGNOSIS — K5730 Diverticulosis of large intestine without perforation or abscess without bleeding: Secondary | ICD-10-CM | POA: Diagnosis not present

## 2015-12-16 DIAGNOSIS — Z7189 Other specified counseling: Secondary | ICD-10-CM | POA: Insufficient documentation

## 2015-12-16 HISTORY — DX: Unspecified osteoarthritis, unspecified site: M19.90

## 2015-12-16 HISTORY — PX: POLYPECTOMY: SHX5525

## 2015-12-16 HISTORY — PX: COLONOSCOPY WITH PROPOFOL: SHX5780

## 2015-12-16 SURGERY — COLONOSCOPY WITH PROPOFOL
Anesthesia: Monitor Anesthesia Care | Wound class: Contaminated

## 2015-12-16 MED ORDER — ACETAMINOPHEN 325 MG PO TABS
325.0000 mg | ORAL_TABLET | ORAL | Status: DC | PRN
Start: 2015-12-16 — End: 2015-12-16

## 2015-12-16 MED ORDER — LACTATED RINGERS IV SOLN
INTRAVENOUS | Status: DC
  Administered 2015-12-16 (×2): via INTRAVENOUS

## 2015-12-16 MED ORDER — STERILE WATER FOR IRRIGATION IR SOLN
Status: DC | PRN
  Administered 2015-12-16: 11:00:00

## 2015-12-16 MED ORDER — LIDOCAINE HCL (CARDIAC) 20 MG/ML IV SOLN
INTRAVENOUS | Status: DC | PRN
  Administered 2015-12-16: 40 mg via INTRAVENOUS

## 2015-12-16 MED ORDER — PROPOFOL 10 MG/ML IV BOLUS
INTRAVENOUS | Status: DC | PRN
  Administered 2015-12-16: 40 mg via INTRAVENOUS
  Administered 2015-12-16 (×2): 80 mg via INTRAVENOUS

## 2015-12-16 MED ORDER — ACETAMINOPHEN 160 MG/5ML PO SOLN: 325.0000 mg | ORAL | Status: DC | PRN

## 2015-12-16 SURGICAL SUPPLY — 23 items
CANISTER SUCT 1200ML W/VALVE (MISCELLANEOUS) ×4 IMPLANT
CLIP HMST 235XBRD CATH ROT (MISCELLANEOUS) IMPLANT
CLIP RESOLUTION 360 11X235 (MISCELLANEOUS)
FCP ESCP3.2XJMB 240X2.8X (MISCELLANEOUS)
FORCEPS BIOP RAD 4 LRG CAP 4 (CUTTING FORCEPS) ×4 IMPLANT
FORCEPS BIOP RJ4 240 W/NDL (MISCELLANEOUS)
FORCEPS ESCP3.2XJMB 240X2.8X (MISCELLANEOUS) IMPLANT
GOWN CVR UNV OPN BCK APRN NK (MISCELLANEOUS) ×4 IMPLANT
GOWN ISOL THUMB LOOP REG UNIV (MISCELLANEOUS) ×4
INJECTOR VARIJECT VIN23 (MISCELLANEOUS) IMPLANT
KIT DEFENDO VALVE AND CONN (KITS) IMPLANT
KIT ENDO PROCEDURE OLY (KITS) ×4 IMPLANT
MARKER SPOT ENDO TATTOO 5ML (MISCELLANEOUS) IMPLANT
PAD GROUND ADULT SPLIT (MISCELLANEOUS) IMPLANT
PROBE APC STR FIRE (PROBE) IMPLANT
RETRIEVER NET ROTH 2.5X230 LF (MISCELLANEOUS) ×4 IMPLANT
SNARE SHORT THROW 13M SML OVAL (MISCELLANEOUS) IMPLANT
SNARE SHORT THROW 30M LRG OVAL (MISCELLANEOUS) IMPLANT
SNARE SNG USE RND 15MM (INSTRUMENTS) IMPLANT
SPOT EX ENDOSCOPIC TATTOO (MISCELLANEOUS)
TRAP ETRAP POLY (MISCELLANEOUS) IMPLANT
VARIJECT INJECTOR VIN23 (MISCELLANEOUS)
WATER STERILE IRR 250ML POUR (IV SOLUTION) ×4 IMPLANT

## 2015-12-16 NOTE — Anesthesia Postprocedure Evaluation (Signed)
Anesthesia Post Note  Patient: Dillon Schultz  Procedure(s) Performed: Procedure(s) (LRB): COLONOSCOPY WITH PROPOFOL (N/A) POLYPECTOMY  Patient location during evaluation: PACU Anesthesia Type: MAC Level of consciousness: awake and alert and oriented Pain management: satisfactory to patient Vital Signs Assessment: post-procedure vital signs reviewed and stable Respiratory status: spontaneous breathing, nonlabored ventilation and respiratory function stable Cardiovascular status: blood pressure returned to baseline and stable Postop Assessment: Adequate PO intake and No signs of nausea or vomiting Anesthetic complications: no    Raliegh Ip

## 2015-12-16 NOTE — Op Note (Signed)
[facility] Gastroenterology Patient Name: Dillon Schultz Procedure Date: 12/16/2015 11:18 AM MRN: [id number] Account #: [id number] Date of Birth: 1945/10/30 Admit Type: Outpatient Age: 70 Room: [facility] OR ROOM 01 Gender: Male Note Status: Finalized Procedure:            Colonoscopy Indications:          Screening for colorectal malignant neoplasm Providers:            Lucilla Lame, MD Referring MD:         Edmonia Lynch. Aycock MD (Referring MD) Medicines:            Propofol per Anesthesia Complications:        No immediate complications. Procedure:            Pre-Anesthesia Assessment:                       - Prior to the procedure, a History and Physical was                        performed, and patient medications and allergies were                        reviewed. The patient's tolerance of previous                        anesthesia was also reviewed. The risks and benefits of                        the procedure and the sedation options and risks were                        discussed with the patient. All questions were                        answered, and informed consent was obtained. Prior                        Anticoagulants: The patient has taken no previous                        anticoagulant or antiplatelet agents. ASA Grade                        Assessment: II - A patient with mild systemic disease.                        After reviewing the risks and benefits, the patient was                        deemed in satisfactory condition to undergo the                        procedure.                       After obtaining informed consent, the colonoscope was                        passed under direct vision. Throughout the procedure,  the patient's blood pressure, pulse, and oxygen                        saturations were monitored continuously. The Olympus                        CF-HQ190L Colonoscope (S#. [id number]) was introduced              through the anus and advanced to the the cecum,                        identified by appendiceal orifice and ileocecal valve.                        The colonoscopy was performed without difficulty. The                        patient tolerated the procedure well. The quality of                        the bowel preparation was excellent. Findings:      The perianal and digital rectal examinations were normal.      A 3 mm polyp was found in the cecum. The polyp was sessile. The polyp       was removed with a cold biopsy forceps. Resection and retrieval were       complete.      A 4 mm polyp was found in the ascending colon. The polyp was sessile.       The polyp was removed with a cold biopsy forceps. Resection and       retrieval were complete.      A 4 mm polyp was found in the transverse colon. The polyp was sessile.       The polyp was removed with a cold biopsy forceps. Resection and       retrieval were complete.      A 3 mm polyp was found in the sigmoid colon. The polyp was sessile. The       polyp was removed with a cold biopsy forceps. Resection and retrieval       were complete.      Multiple small-mouthed diverticula were found in the sigmoid colon.      Non-bleeding internal hemorrhoids were found during retroflexion. The       hemorrhoids were Grade III (internal hemorrhoids that prolapse but       require manual reduction). Impression:           - One 3 mm polyp in the cecum, removed with a cold                        biopsy forceps. Resected and retrieved.                       - One 4 mm polyp in the ascending colon, removed with a                        cold biopsy forceps. Resected and retrieved.                       - One 4 mm polyp in the transverse colon, removed with  a cold biopsy forceps. Resected and retrieved.                       - One 3 mm polyp in the sigmoid colon, removed with a                        cold biopsy forceps.  Resected and retrieved.                       - Diverticulosis in the sigmoid colon. Recommendation:       - Await pathology results.                       - Repeat colonoscopy in 5 years if polyp adenoma and 10                        years if hyperplastic Procedure Code(s):    --- Professional ---                       [phone], Colonoscopy, flexible; with biopsy, single or                        multiple Diagnosis Code(s):    --- Professional ---                       Z12.11, Encounter for screening for malignant neoplasm                        of colon                       D12.0, Benign neoplasm of cecum                       D12.2, Benign neoplasm of ascending colon                       D12.3, Benign neoplasm of transverse colon (hepatic                        flexure or splenic flexure)                       D12.5, Benign neoplasm of sigmoid colon CPT copyright 2016 American Medical Association. All rights reserved. The codes documented in this report are preliminary and upon coder review may  be revised to meet current compliance requirements. Lucilla Lame, MD 12/16/2015 11:40:13 AM This report has been signed electronically. Number of Addenda: 0 Note Initiated On: 12/16/2015 11:18 AM Scope Withdrawal Time: 0 hours 7 minutes 1 second  Total Procedure Duration: 0 hours 11 minutes 6 seconds       [facility]

## 2015-12-16 NOTE — H&P (Signed)
  Lucilla Lame, MD [facility] [address], [place], [place] [address] Phone: [phone] Fax : [phone]  Primary Care Physician:  No primary care provider on file. Primary Gastroenterologist:  Dr. Allen Norris  Pre-Procedure History & Physical: HPI:  Dillon Schultz is a 70 y.o. male is here for a screening colonoscopy.   Past Medical History  Diagnosis Date  . Hyperlipidemia   . Hypertension   . Gout   . Lumbar herniated disc   . 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 Functional constipation   . 1St degree AV block   . Back pain   . Dental caries   . Allergic rhinitis, cause unspecified   . Arthritis     Past Surgical History  Procedure Laterality Date  . Colonoscopy  2010    Prior to Admission medications   Medication Sig Start Date End Date Taking? Authorizing Provider  furosemide (LASIX) 20 MG tablet Take 20 mg by mouth daily.   Yes Historical Provider, MD  amLODipine (NORVASC) 10 MG tablet Take 10 mg by mouth daily. Reported on 12/08/2015    Historical Provider, MD  azelastine (ASTELIN) 137 MCG/SPRAY nasal spray Place 2 sprays into the nose 2 (two) times daily. Reported on 12/08/2015    Historical Provider, MD  cetirizine (ZYRTEC) 10 MG tablet Take 10 mg by mouth daily. Reported on 12/08/2015    Historical Provider, MD  docusate sodium (COLACE) 100 MG capsule Take 2 capsules (200 mg total) by mouth 2 (two) times daily. Patient not taking: Reported on 12/08/2015 11/25/15   Carrie Mew, MD  lisinopril (PRINIVIL,ZESTRIL) 40 MG tablet Reported on 12/16/2015    Historical Provider, MD  polyethylene glycol (GOLYTELY) 236 g solution Take 4,000 mLs by mouth once. Drink one 8 oz glass every 30 mins until stools are clear. 11/11/15   Lucilla Lame, MD    Allergies as of 11/11/2015 - Review Complete 11/11/2015  Allergen Reaction Noted  . Glucophage [metformin hcl]  02/04/2012    History reviewed. No pertinent family history.  Social History   Social History  . Marital Status: Married   Spouse Name: N/A  . Number of Children: N/A  . Years of Education: N/A   Occupational History  . Not on file.   Social History Main Topics  . Smoking status: Former Smoker -- 5 years    Types: Cigarettes    Quit date: 06/26/1967  . Smokeless tobacco: Not on file  . Alcohol Use: Not on file  . Drug Use: Not on file  . Sexual Activity: Not on file   Other Topics Concern  . Not on file   Social History Narrative    Review of Systems: See HPI, otherwise negative ROS  Physical Exam: BP 141/87 mmHg  Pulse 49  Temp(Src) 97.2 F (36.2 C) (Temporal)  Ht 5\' 7"  (1.702 m)  Wt 199 lb (90.266 kg)  BMI 31.16 kg/m2  SpO2 100% General:   Alert,  pleasant and cooperative in NAD Head:  Normocephalic and atraumatic. Neck:  Supple; no masses or thyromegaly. Lungs:  Clear throughout to auscultation.    Heart:  Regular rate and rhythm. Abdomen:  Soft, nontender and nondistended. Normal bowel sounds, without guarding, and without rebound.   Neurologic:  Alert and  oriented x4;  grossly normal neurologically.  Impression/Plan: Dillon Schultz is now here to undergo a screening colonoscopy.  Risks, benefits, and alternatives regarding colonoscopy have been reviewed with the patient.  Questions have been answered.  All parties agreeable.

## 2015-12-16 NOTE — Transfer of Care (Signed)
Immediate Anesthesia Transfer of Care Note  Patient: Dillon Schultz  Procedure(s) Performed: Procedure(s): COLONOSCOPY WITH PROPOFOL (N/A) POLYPECTOMY  Patient Location: PACU  Anesthesia Type: MAC  Level of Consciousness: awake, alert  and patient cooperative  Airway and Oxygen Therapy: Patient Spontanous Breathing and Patient connected to supplemental oxygen  Post-op Assessment: Post-op Vital signs reviewed, Patient's Cardiovascular Status Stable, Respiratory Function Stable, Patent Airway and No signs of Nausea or vomiting  Post-op Vital Signs: Reviewed and stable  Complications: No apparent anesthesia complications

## 2015-12-16 NOTE — Anesthesia Preprocedure Evaluation (Signed)
Anesthesia Evaluation  Patient identified by MRN, date of birth, ID band  Reviewed: Allergy & Precautions, H&P , NPO status , Patient's Chart, lab work & pertinent test results  Airway Mallampati: II  TM Distance: >3 FB Neck ROM: full    Dental no notable dental hx. (+) Poor Dentition, Upper Dentures   Pulmonary former smoker,    Pulmonary exam normal        Cardiovascular hypertension,  Rhythm:regular Rate:Normal     Neuro/Psych    GI/Hepatic   Endo/Other    Renal/GU      Musculoskeletal   Abdominal   Peds  Hematology   Anesthesia Other Findings   Reproductive/Obstetrics                             Anesthesia Physical Anesthesia Plan  ASA: II  Anesthesia Plan: MAC   Post-op Pain Management:    Induction:   Airway Management Planned:   Additional Equipment:   Intra-op Plan:   Post-operative Plan:   Informed Consent: I have reviewed the patients History and Physical, chart, labs and discussed the procedure including the risks, benefits and alternatives for the proposed anesthesia with the patient or authorized representative who has indicated his/her understanding and acceptance.     Plan Discussed with: CRNA  Anesthesia Plan Comments:         Anesthesia Quick Evaluation

## 2015-12-19 ENCOUNTER — Encounter: Payer: Self-pay | Admitting: Gastroenterology

## 2016-02-23 ENCOUNTER — Ambulatory Visit
Admission: RE | Admit: 2016-02-23 | Discharge: 2016-02-23 | Disposition: A | Discharge status: Home / Self Care | Payer: Medicare Other | Source: Ambulatory Visit | Attending: Family Medicine | Admitting: Family Medicine

## 2016-02-23 ENCOUNTER — Other Ambulatory Visit: Payer: Self-pay | Admitting: Family Medicine

## 2016-02-23 DIAGNOSIS — R600 Localized edema: Secondary | ICD-10-CM

## 2016-02-23 DIAGNOSIS — R609 Edema, unspecified: Secondary | ICD-10-CM | POA: Diagnosis present

## 2016-08-30 ENCOUNTER — Emergency Department: Payer: Medicare Other

## 2016-08-30 ENCOUNTER — Encounter: Payer: Self-pay | Admitting: Emergency Medicine

## 2016-08-30 ENCOUNTER — Emergency Department
Admission: EM | Admit: 2016-08-30 | Discharge: 2016-08-30 | Disposition: A | Discharge status: Home / Self Care | Payer: Medicare Other | Attending: Emergency Medicine | Admitting: Emergency Medicine

## 2016-08-30 DIAGNOSIS — R0789 Other chest pain: Secondary | ICD-10-CM | POA: Diagnosis not present

## 2016-08-30 DIAGNOSIS — I10 Essential (primary) hypertension: Secondary | ICD-10-CM | POA: Insufficient documentation

## 2016-08-30 DIAGNOSIS — R52 Pain, unspecified: Secondary | ICD-10-CM

## 2016-08-30 DIAGNOSIS — Z79899 Other long term (current) drug therapy: Secondary | ICD-10-CM | POA: Insufficient documentation

## 2016-08-30 DIAGNOSIS — Z87891 Personal history of nicotine dependence: Secondary | ICD-10-CM | POA: Insufficient documentation

## 2016-08-30 NOTE — ED Notes (Signed)
Pt states was bending over to tie shoe and had left rib pain. Pt no longer has pain. Wife wants [place] to check him out for diarrhea after eating beans last week. Pt pushing wife in the wheelchair with no problems or pain.

## 2016-08-30 NOTE — ED Triage Notes (Signed)
Pt states was tying shoes and felt left rib pain.

## 2016-08-30 NOTE — ED Provider Notes (Signed)
[facility] Emergency Department Provider Note ____________________________________________  Time seen: 1625  I have reviewed the triage vital signs and the nursing notes.  HISTORY  Chief Complaint  Chest Pain (left rib pain)  HPI Dillon Schultz is a 71 y.o. male presents to the ED accompanied by his wife for evaluation of a now resolved left-sided chest wall pain. Patient describes he bent over this morning while seated, type issue. He describes pain to the left chest and ribs, which is now completely resolved.As it turns out, the patient had a complete physical exam yesterday with his primary care provider. They have not been given any results of any blood labs drawn at that time. Patient was is without any additional complaints at this time.  Past Medical History:  Diagnosis Date  . 1st degree AV block   . Allergic rhinitis, cause unspecified   . Arthritis   . Back pain   . Dental caries   . Functional constipation   . Gout   . Hyperlipidemia   . Hypertension   . Lumbar herniated disc   . 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 Special screening for malignant neoplasms, colon   . Benign neoplasm of cecum   . Benign neoplasm of ascending colon   . Benign neoplasm of transverse colon   . Benign neoplasm of sigmoid colon     Past Surgical History:  Procedure Laterality Date  . COLONOSCOPY  2010  . COLONOSCOPY WITH PROPOFOL N/A 12/16/2015   Procedure: COLONOSCOPY WITH PROPOFOL;  Surgeon: Lucilla Lame, MD;  Location: [facility];  Service: Endoscopy;  Laterality: N/A;  . POLYPECTOMY  12/16/2015   Procedure: POLYPECTOMY;  Surgeon: Lucilla Lame, MD;  Location: [facility];  Service: Endoscopy;;    Prior to Admission medications   Medication Sig Start Date End Date Taking? Authorizing Provider  amLODipine (NORVASC) 10 MG tablet Take 10 mg by mouth daily. Reported on 12/08/2015    Historical Provider,  MD  azelastine (ASTELIN) 137 MCG/SPRAY nasal spray Place 2 sprays into the nose 2 (two) times daily. Reported on 12/08/2015    Historical Provider, MD  cetirizine (ZYRTEC) 10 MG tablet Take 10 mg by mouth daily. Reported on 12/08/2015    Historical Provider, MD  docusate sodium (COLACE) 100 MG capsule Take 2 capsules (200 mg total) by mouth 2 (two) times daily. Patient not taking: Reported on 12/08/2015 11/25/15   Carrie Mew, MD  furosemide (LASIX) 20 MG tablet Take 20 mg by mouth daily.    Historical Provider, MD  lisinopril (PRINIVIL,ZESTRIL) 40 MG tablet Reported on 12/16/2015    Historical Provider, MD  polyethylene glycol (GOLYTELY) 236 g solution Take 4,000 mLs by mouth once. Drink one 8 oz glass every 30 mins until stools are clear. 11/11/15   Lucilla Lame, MD    Allergies Glucophage [metformin hcl]  No family history on file.  Social History Social History  Substance Use Topics  . Smoking status: Former Smoker    Years: 5.00    Types: Cigarettes    Quit date: 06/26/1967  . Smokeless tobacco: Never Used  . Alcohol use Not on file    Review of Systems  Constitutional: Negative for fever. Eyes: Negative for visual changes. ENT: Negative for sore throat. Cardiovascular: Negative for chest pain. Respiratory: Negative for shortness of breath. Gastrointestinal: Negative for abdominal pain, vomiting and diarrhea. Genitourinary: Negative for dysuria. Musculoskeletal: Negative for back pain. Left rib  pain as above. Skin: Negative for rash. Neurological: Negative for headaches, focal weakness or numbness. ____________________________________________  PHYSICAL EXAM:  VITAL SIGNS: ED Triage Vitals  Enc Vitals Group     BP 08/30/16 1615 110/74     Pulse Rate 08/30/16 1615 73     Resp 08/30/16 1615 18     Temp 08/30/16 1615 97.5 F (36.4 C)     Temp src --      SpO2 08/30/16 1615 98 %     Weight 08/30/16 1616 211 lb (95.7 kg)     Height 08/30/16 1616 5\' 8"  (1.727 m)      Head Circumference --      Peak Flow --      Pain Score --      Pain Loc --      Pain Edu? --      Excl. in [place]? --     Constitutional: Alert and oriented. Well appearing and in no distress. Head: Normocephalic and atraumatic. Cardiovascular: Normal rate, regular rhythm. Normal distal pulses. Respiratory: Normal respiratory effort. No wheezes/rales/rhonchi. Gastrointestinal: Soft and nontender. No distention. Musculoskeletal: Nontender with normal range of motion in all extremities.  Neurologic:  Normal gait without ataxia. Normal speech and language. No gross focal neurologic deficits are appreciated. Skin:  Skin is warm, dry and intact. No rash noted. ____________________________________________   RADIOLOGY  CXR  IMPRESSION: Minimal basilar atelectasis. Otherwise no radiographic evidence for acute cardiopulmonary abnormality.  I, Wonda Goodgame, Dannielle Karvonen, personally viewed and evaluated these images (plain radiographs) as part of my medical decision making, as well as reviewing the written report by the radiologist. ____________________________________________  INITIAL IMPRESSION / [place] / ED COURSE  Patient with a now resolved nonspecific anterior chest wall pain on presentation. His chest x-ray is reassuring as it shows no acute cardio pulmonary process. Mild atelectasis noted bilaterally. Patient is advised to take Tylenol as needed for any interim pain. He is also advised to increase fluid intake and to monitor for any symptoms of shortness of breath. Patient will follow with his primary care provider for both routine physical exam results from yesterday. He should also follow-up for any worsening or continuing symptoms. ____________________________________________  FINAL CLINICAL IMPRESSION(S) / ED DIAGNOSES  Final diagnoses:  Chest wall pain      Melvenia Needles, PA-C 08/30/16 [place] Quigley, MD 08/30/16 1911

## 2016-08-30 NOTE — Discharge Instructions (Signed)
Your exam and x-ray are essentially normal today. You should continue to monitor symptoms. Follow-up with your provider for continued symptoms.

## 2016-08-30 NOTE — ED Notes (Signed)
See triage note  States he bent over to tie shoes felt pain to left rib area  Ambulates well  No resp issues at present

## 2018-07-02 ENCOUNTER — Other Ambulatory Visit: Payer: Self-pay | Admitting: Cardiology

## 2018-07-02 ENCOUNTER — Other Ambulatory Visit (HOSPITAL_COMMUNITY): Payer: Self-pay | Admitting: Cardiology

## 2018-07-02 DIAGNOSIS — I10 Essential (primary) hypertension: Secondary | ICD-10-CM

## 2018-07-02 DIAGNOSIS — R609 Edema, unspecified: Secondary | ICD-10-CM

## 2018-07-03 ENCOUNTER — Ambulatory Visit: Admission: RE | Admit: 2018-07-03 | Payer: Medicare Other | Source: Ambulatory Visit

## 2019-02-20 ENCOUNTER — Ambulatory Visit
Admission: RE | Admit: 2019-02-20 | Discharge: 2019-02-20 | Disposition: A | Discharge status: Home / Self Care | Payer: Medicare Other | Source: Ambulatory Visit | Attending: Primary Care | Admitting: Primary Care

## 2019-02-20 ENCOUNTER — Other Ambulatory Visit: Payer: Self-pay | Admitting: Primary Care

## 2019-02-20 DIAGNOSIS — M79645 Pain in left finger(s): Secondary | ICD-10-CM | POA: Insufficient documentation

## 2020-12-22 ENCOUNTER — Emergency Department: Payer: Medicare Other

## 2020-12-22 ENCOUNTER — Other Ambulatory Visit: Payer: Self-pay

## 2020-12-22 ENCOUNTER — Emergency Department
Admission: EM | Admit: 2020-12-22 | Discharge: 2020-12-22 | Disposition: A | Discharge status: Home / Self Care | Payer: Medicare Other | Attending: Emergency Medicine | Admitting: Emergency Medicine

## 2020-12-22 DIAGNOSIS — R456 Violent behavior: Secondary | ICD-10-CM | POA: Insufficient documentation

## 2020-12-22 DIAGNOSIS — R600 Localized edema: Secondary | ICD-10-CM | POA: Insufficient documentation

## 2020-12-22 DIAGNOSIS — I10 Essential (primary) hypertension: Secondary | ICD-10-CM | POA: Diagnosis not present

## 2020-12-22 DIAGNOSIS — Z79899 Other long term (current) drug therapy: Secondary | ICD-10-CM | POA: Insufficient documentation

## 2020-12-22 DIAGNOSIS — Z046 Encounter for general psychiatric examination, requested by authority: Secondary | ICD-10-CM

## 2020-12-22 DIAGNOSIS — Z20822 Contact with and (suspected) exposure to covid-19: Secondary | ICD-10-CM | POA: Insufficient documentation

## 2020-12-22 DIAGNOSIS — Z87891 Personal history of nicotine dependence: Secondary | ICD-10-CM | POA: Diagnosis not present

## 2020-12-22 DIAGNOSIS — F0391 Unspecified dementia with behavioral disturbance: Secondary | ICD-10-CM | POA: Diagnosis not present

## 2020-12-22 DIAGNOSIS — F0151 Vascular dementia with behavioral disturbance: Secondary | ICD-10-CM

## 2020-12-22 DIAGNOSIS — Z85038 Personal history of other malignant neoplasm of large intestine: Secondary | ICD-10-CM | POA: Insufficient documentation

## 2020-12-22 DIAGNOSIS — M79605 Pain in left leg: Secondary | ICD-10-CM | POA: Diagnosis not present

## 2020-12-22 DIAGNOSIS — F03918 Unspecified dementia, unspecified severity, with other behavioral disturbance: Secondary | ICD-10-CM

## 2020-12-22 DIAGNOSIS — R4689 Other symptoms and signs involving appearance and behavior: Secondary | ICD-10-CM

## 2020-12-22 LAB — URINE DRUG SCREEN, QUALITATIVE (ARMC ONLY)
Amphetamines, Ur Screen: NOT DETECTED
Barbiturates, Ur Screen: NOT DETECTED
Benzodiazepine, Ur Scrn: NOT DETECTED
Cannabinoid 50 Ng, Ur ~~LOC~~: NOT DETECTED
Cocaine Metabolite,Ur ~~LOC~~: NOT DETECTED
MDMA (Ecstasy)Ur Screen: NOT DETECTED
Methadone Scn, Ur: NOT DETECTED
Opiate, Ur Screen: NOT DETECTED
Phencyclidine (PCP) Ur S: NOT DETECTED
Tricyclic, Ur Screen: NOT DETECTED

## 2020-12-22 LAB — URINALYSIS, COMPLETE (UACMP) WITH MICROSCOPIC
Bacteria, UA: NONE SEEN
Bilirubin Urine: NEGATIVE
Glucose, UA: NEGATIVE mg/dL
Hgb urine dipstick: NEGATIVE
Ketones, ur: NEGATIVE mg/dL
Leukocytes,Ua: NEGATIVE
Nitrite: NEGATIVE
Protein, ur: NEGATIVE mg/dL
Specific Gravity, Urine: 1.006 (ref 1.005–1.030)
Squamous Epithelial / HPF: NONE SEEN (ref 0–5)
pH: 8 (ref 5.0–8.0)

## 2020-12-22 LAB — COMPREHENSIVE METABOLIC PANEL
ALT: 11 U/L (ref 0–44)
AST: 19 U/L (ref 15–41)
Albumin: 3.9 g/dL (ref 3.5–5.0)
Alkaline Phosphatase: 83 U/L (ref 38–126)
Anion gap: 6 (ref 5–15)
BUN: 23 mg/dL (ref 8–23)
CO2: 26 mmol/L (ref 22–32)
Calcium: 9.1 mg/dL (ref 8.9–10.3)
Chloride: 107 mmol/L (ref 98–111)
Creatinine, Ser: 1.44 mg/dL — ABNORMAL HIGH (ref 0.61–1.24)
GFR, Estimated: 50 mL/min — ABNORMAL LOW (ref >60)
Glucose, Bld: 93 mg/dL (ref 70–99)
Potassium: 3.8 mmol/L (ref 3.5–5.1)
Sodium: 139 mmol/L (ref 135–145)
Total Bilirubin: 0.9 mg/dL (ref 0.3–1.2)
Total Protein: 7.3 g/dL (ref 6.5–8.1)

## 2020-12-22 LAB — CBC
HCT: 40.2 % (ref 39.0–52.0)
Hemoglobin: 13 g/dL (ref 13.0–17.0)
MCH: 30.8 pg (ref 26.0–34.0)
MCHC: 32.3 g/dL (ref 30.0–36.0)
MCV: 95.3 fL (ref 80.0–100.0)
Platelets: 148 10*3/uL — ABNORMAL LOW (ref 150–400)
RBC: 4.22 MIL/uL (ref 4.22–5.81)
RDW: 13.1 % (ref 11.5–15.5)
WBC: 5.1 10*3/uL (ref 4.0–10.5)
nRBC: 0 % (ref 0.0–0.2)

## 2020-12-22 LAB — RESP PANEL BY RT-PCR (FLU A&B, COVID) ARPGX2
Influenza A by PCR: NEGATIVE
Influenza B by PCR: NEGATIVE
SARS Coronavirus 2 by RT PCR: NEGATIVE

## 2020-12-22 LAB — SALICYLATE LEVEL: Salicylate Lvl: <7 mg/dL — ABNORMAL LOW (ref 7.0–30.0)

## 2020-12-22 LAB — ACETAMINOPHEN LEVEL: Acetaminophen (Tylenol), Serum: <10 ug/mL — ABNORMAL LOW (ref 10–30)

## 2020-12-22 LAB — ETHANOL: Alcohol, Ethyl (B): <10 mg/dL (ref <10)

## 2020-12-22 LAB — BRAIN NATRIURETIC PEPTIDE: B Natriuretic Peptide: 175.5 pg/mL — ABNORMAL HIGH (ref 0.0–100.0)

## 2020-12-22 MED ORDER — LISINOPRIL 10 MG PO TABS
40.0000 mg | ORAL_TABLET | Freq: Every day | ORAL | Status: DC
  Administered 2020-12-22: 40 mg via ORAL
  Filled 2020-12-22 (×2): qty 4

## 2020-12-22 MED ORDER — DONEPEZIL HCL 5 MG PO TABS: 10.0000 mg | ORAL_TABLET | Freq: Every day | ORAL | Status: DC

## 2020-12-22 MED ORDER — GABAPENTIN 300 MG PO CAPS
300.0000 mg | ORAL_CAPSULE | Freq: Three times a day (TID) | ORAL | Status: DC
  Administered 2020-12-22: 300 mg via ORAL
  Filled 2020-12-22: qty 1

## 2020-12-22 MED ORDER — ACETAMINOPHEN 500 MG PO TABS
1000.0000 mg | ORAL_TABLET | Freq: Once | ORAL | Status: AC
  Administered 2020-12-22: 1000 mg via ORAL
  Filled 2020-12-22: qty 2

## 2020-12-22 MED ORDER — FUROSEMIDE 40 MG PO TABS
40.0000 mg | ORAL_TABLET | Freq: Every day | ORAL | Status: DC
  Administered 2020-12-22: 40 mg via ORAL
  Filled 2020-12-22 (×2): qty 1

## 2020-12-22 NOTE — ED Notes (Signed)
Pt continues to express unhappiness with home situation and goes on tangents in middle of conversation about it

## 2020-12-22 NOTE — ED Notes (Signed)
Hourly rounding performed, patient currently awake in hallway bed. Patient has no complaints at this time. Q15 minute rounds and monitoring via Engineer, drilling to continue.
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
## 2020-12-22 NOTE — ED Notes (Signed)
Pt is with TTS in consult room at this time. Security present in room

## 2020-12-22 NOTE — ED Notes (Signed)
Patient transferred from Triage to room [facility] after dressing out and screening for contraband. Report received from [place], [place] including situation, background, assessment and recommendations. Pt oriented to [employer] including Q15 minute rounds as well as Engineer, drilling for their protection. Patient is alert and oriented, warm and dry in no acute distress. Patient denies SI, HI, and AVH. Pt. Encouraged to let this nurse know if needs arise.

## 2020-12-22 NOTE — ED Notes (Addendum)
With permission, updated wife, Judson Roch --and requested that she call back granddaughter of patient; as she is main point of contact. As to also to respect the [place] laws for patient.
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
## 2020-12-22 NOTE — ED Notes (Signed)
Attempt to reassess VS. Pt unable to sit still due to leg cramping--unable to get reading.

## 2020-12-22 NOTE — ED Notes (Signed)
EDP [place] aware of high blood pressure. Pt reports he does not have hx of hypertension, but chart states otherwise. Pt noncompliant with his blood pressure medication. Pt with hx of dementia. Alert X3 at this time

## 2020-12-22 NOTE — ED Notes (Signed)
Gave lunch tray with juice.

## 2020-12-22 NOTE — ED Notes (Signed)
IVC, pending consult

## 2020-12-22 NOTE — ED Notes (Signed)
Pt rolled to CT and back by this nurse and security

## 2020-12-22 NOTE — ED Notes (Signed)
Pt ate 100% of breakfast tray.
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
## 2020-12-22 NOTE — ED Notes (Signed)
Offered pt recliner, as he is sleeping in chair currently and he declines. Declines getting in bed as well

## 2020-12-22 NOTE — ED Notes (Addendum)
Pt dressed out by this RN and Lattie Haw ed tech  [employer] Socks  [employer] Tee shirt Black long johns Blue pants [employer], keys, loose change placed into plastic bag

## 2020-12-22 NOTE — ED Notes (Addendum)
Given both bags of belongings for discharge. Daughter, T here to to pick up patient. Wife confirms she feels safe for pt to come home. Refusing repeat VS. Pt irritable when asking him to do anything, states "I'll do it on my own".

## 2021-04-24 ENCOUNTER — Encounter (INDEPENDENT_AMBULATORY_CARE_PROVIDER_SITE_OTHER): Payer: Medicare Other | Admitting: Vascular Surgery

## 2021-06-01 ENCOUNTER — Encounter (INDEPENDENT_AMBULATORY_CARE_PROVIDER_SITE_OTHER): Payer: Medicare Other | Admitting: Nurse Practitioner

## 2021-07-07 ENCOUNTER — Inpatient Hospital Stay
Admission: EM | Admit: 2021-07-07 | Discharge: 2021-07-11 | DRG: 682 | Disposition: A | Discharge status: Home / Self Care | Payer: Medicare Other | Attending: Internal Medicine | Admitting: Internal Medicine

## 2021-07-07 ENCOUNTER — Inpatient Hospital Stay: Payer: Medicare Other

## 2021-07-07 ENCOUNTER — Other Ambulatory Visit: Payer: Self-pay

## 2021-07-07 ENCOUNTER — Encounter: Payer: Self-pay | Admitting: Emergency Medicine

## 2021-07-07 ENCOUNTER — Emergency Department: Payer: Medicare Other

## 2021-07-07 DIAGNOSIS — R627 Adult failure to thrive: Secondary | ICD-10-CM | POA: Diagnosis present

## 2021-07-07 DIAGNOSIS — I959 Hypotension, unspecified: Secondary | ICD-10-CM | POA: Diagnosis present

## 2021-07-07 DIAGNOSIS — R410 Disorientation, unspecified: Secondary | ICD-10-CM | POA: Diagnosis not present

## 2021-07-07 DIAGNOSIS — R55 Syncope and collapse: Secondary | ICD-10-CM | POA: Diagnosis present

## 2021-07-07 DIAGNOSIS — N184 Chronic kidney disease, stage 4 (severe): Secondary | ICD-10-CM | POA: Diagnosis present

## 2021-07-07 DIAGNOSIS — Z20822 Contact with and (suspected) exposure to covid-19: Secondary | ICD-10-CM | POA: Diagnosis present

## 2021-07-07 DIAGNOSIS — I5032 Chronic diastolic (congestive) heart failure: Secondary | ICD-10-CM | POA: Diagnosis present

## 2021-07-07 DIAGNOSIS — E162 Hypoglycemia, unspecified: Secondary | ICD-10-CM | POA: Diagnosis not present

## 2021-07-07 DIAGNOSIS — Z888 Allergy status to other drugs, medicaments and biological substances status: Secondary | ICD-10-CM

## 2021-07-07 DIAGNOSIS — R531 Weakness: Secondary | ICD-10-CM

## 2021-07-07 DIAGNOSIS — I440 Atrioventricular block, first degree: Secondary | ICD-10-CM | POA: Diagnosis present

## 2021-07-07 DIAGNOSIS — E785 Hyperlipidemia, unspecified: Secondary | ICD-10-CM | POA: Diagnosis present

## 2021-07-07 DIAGNOSIS — F03C18 Unspecified dementia, severe, with other behavioral disturbance: Secondary | ICD-10-CM | POA: Diagnosis present

## 2021-07-07 DIAGNOSIS — G9341 Metabolic encephalopathy: Secondary | ICD-10-CM | POA: Diagnosis present

## 2021-07-07 DIAGNOSIS — M109 Gout, unspecified: Secondary | ICD-10-CM | POA: Diagnosis present

## 2021-07-07 DIAGNOSIS — Z6823 Body mass index (BMI) 23.0-23.9, adult: Secondary | ICD-10-CM | POA: Diagnosis not present

## 2021-07-07 DIAGNOSIS — I130 Hypertensive heart and chronic kidney disease with heart failure and stage 1 through stage 4 chronic kidney disease, or unspecified chronic kidney disease: Secondary | ICD-10-CM | POA: Diagnosis present

## 2021-07-07 DIAGNOSIS — F03918 Unspecified dementia, unspecified severity, with other behavioral disturbance: Secondary | ICD-10-CM | POA: Diagnosis present

## 2021-07-07 DIAGNOSIS — E860 Dehydration: Secondary | ICD-10-CM | POA: Diagnosis present

## 2021-07-07 DIAGNOSIS — Z87891 Personal history of nicotine dependence: Secondary | ICD-10-CM

## 2021-07-07 DIAGNOSIS — Z7189 Other specified counseling: Secondary | ICD-10-CM | POA: Diagnosis not present

## 2021-07-07 DIAGNOSIS — I10 Essential (primary) hypertension: Secondary | ICD-10-CM | POA: Diagnosis present

## 2021-07-07 DIAGNOSIS — N179 Acute kidney failure, unspecified: Secondary | ICD-10-CM | POA: Diagnosis not present

## 2021-07-07 DIAGNOSIS — Z79899 Other long term (current) drug therapy: Secondary | ICD-10-CM | POA: Diagnosis not present

## 2021-07-07 LAB — COMPREHENSIVE METABOLIC PANEL
ALT: 7 U/L (ref 0–44)
AST: 14 U/L — ABNORMAL LOW (ref 15–41)
Albumin: 3.8 g/dL (ref 3.5–5.0)
Alkaline Phosphatase: 77 U/L (ref 38–126)
Anion gap: 10 (ref 5–15)
BUN: 68 mg/dL — ABNORMAL HIGH (ref 8–23)
CO2: 30 mmol/L (ref 22–32)
Calcium: 9.5 mg/dL (ref 8.9–10.3)
Chloride: 98 mmol/L (ref 98–111)
Creatinine, Ser: 3.66 mg/dL — ABNORMAL HIGH (ref 0.61–1.24)
GFR, Estimated: 16 mL/min — ABNORMAL LOW (ref >60)
Glucose, Bld: 106 mg/dL — ABNORMAL HIGH (ref 70–99)
Potassium: 4.9 mmol/L (ref 3.5–5.1)
Sodium: 138 mmol/L (ref 135–145)
Total Bilirubin: 0.7 mg/dL (ref 0.3–1.2)
Total Protein: 7.3 g/dL (ref 6.5–8.1)

## 2021-07-07 LAB — URINALYSIS, COMPLETE (UACMP) WITH MICROSCOPIC
Bilirubin Urine: NEGATIVE
Glucose, UA: NEGATIVE mg/dL
Hgb urine dipstick: NEGATIVE
Ketones, ur: NEGATIVE mg/dL
Leukocytes,Ua: NEGATIVE
Nitrite: NEGATIVE
Protein, ur: NEGATIVE mg/dL
Specific Gravity, Urine: 1.01 (ref 1.005–1.030)
pH: 5 (ref 5.0–8.0)

## 2021-07-07 LAB — CBC
HCT: 39.3 % (ref 39.0–52.0)
Hemoglobin: 12.5 g/dL — ABNORMAL LOW (ref 13.0–17.0)
MCH: 29.9 pg (ref 26.0–34.0)
MCHC: 31.8 g/dL (ref 30.0–36.0)
MCV: 94 fL (ref 80.0–100.0)
Platelets: 137 10*3/uL — ABNORMAL LOW (ref 150–400)
RBC: 4.18 MIL/uL — ABNORMAL LOW (ref 4.22–5.81)
RDW: 13.2 % (ref 11.5–15.5)
WBC: 3.7 10*3/uL — ABNORMAL LOW (ref 4.0–10.5)
nRBC: 0 % (ref 0.0–0.2)

## 2021-07-07 LAB — TSH: TSH: 1.083 u[IU]/mL (ref 0.350–4.500)

## 2021-07-07 LAB — RESP PANEL BY RT-PCR (FLU A&B, COVID) ARPGX2
Influenza A by PCR: NEGATIVE
Influenza B by PCR: NEGATIVE
SARS Coronavirus 2 by RT PCR: NEGATIVE

## 2021-07-07 LAB — TROPONIN I (HIGH SENSITIVITY)
Troponin I (High Sensitivity): 13 ng/L (ref <18)
Troponin I (High Sensitivity): 20 ng/L — ABNORMAL HIGH (ref <18)

## 2021-07-07 LAB — CK: Total CK: 91 U/L (ref 49–397)

## 2021-07-07 LAB — BRAIN NATRIURETIC PEPTIDE: B Natriuretic Peptide: 6.8 pg/mL (ref 0.0–100.0)

## 2021-07-07 MED ORDER — HEPARIN SODIUM (PORCINE) 5000 UNIT/ML IJ SOLN: 5000.0000 [IU] | Freq: Three times a day (TID) | INTRAMUSCULAR | Status: DC

## 2021-07-07 MED ORDER — SODIUM CHLORIDE 0.9% FLUSH
3.0000 mL | Freq: Two times a day (BID) | INTRAVENOUS | Status: DC
  Administered 2021-07-08 – 2021-07-10 (×5): 3 mL via INTRAVENOUS

## 2021-07-07 MED ORDER — CITALOPRAM HYDROBROMIDE 20 MG PO TABS
40.0000 mg | ORAL_TABLET | Freq: Every day | ORAL | Status: DC
  Administered 2021-07-08 – 2021-07-10 (×3): 40 mg via ORAL
  Filled 2021-07-07 (×4): qty 2

## 2021-07-07 MED ORDER — ONDANSETRON HCL 4 MG PO TABS: 4.0000 mg | ORAL_TABLET | Freq: Four times a day (QID) | ORAL | Status: DC | PRN

## 2021-07-07 MED ORDER — ACETAMINOPHEN 325 MG PO TABS
650.0000 mg | ORAL_TABLET | Freq: Four times a day (QID) | ORAL | Status: DC | PRN
  Administered 2021-07-09: 650 mg via ORAL
  Filled 2021-07-07: qty 2

## 2021-07-07 MED ORDER — HYDRALAZINE HCL 20 MG/ML IJ SOLN
10.0000 mg | Freq: Four times a day (QID) | INTRAMUSCULAR | Status: DC | PRN
  Administered 2021-07-07: 10 mg via INTRAVENOUS
  Filled 2021-07-07: qty 1

## 2021-07-07 MED ORDER — ACETAMINOPHEN 650 MG RE SUPP: 650.0000 mg | Freq: Four times a day (QID) | RECTAL | Status: DC | PRN

## 2021-07-07 MED ORDER — SODIUM CHLORIDE 0.9 % IV SOLN: INTRAVENOUS | Status: AC

## 2021-07-07 MED ORDER — HEPARIN SODIUM (PORCINE) 5000 UNIT/ML IJ SOLN
5000.0000 [IU] | Freq: Three times a day (TID) | INTRAMUSCULAR | Status: DC
  Administered 2021-07-08 – 2021-07-09 (×5): 5000 [IU] via SUBCUTANEOUS
  Filled 2021-07-07 (×7): qty 1

## 2021-07-07 MED ORDER — ONDANSETRON HCL 4 MG/2ML IJ SOLN: 4.0000 mg | Freq: Four times a day (QID) | INTRAMUSCULAR | Status: DC | PRN

## 2021-07-07 NOTE — ED Notes (Addendum)
Port CXR performed 

## 2021-07-07 NOTE — ED Notes (Signed)
MD at the bedside  

## 2021-07-07 NOTE — ED Notes (Signed)
Called patient's son, [name], and provided an update. Advised that this RN would call him back when there is a disposition and more information to share, verbalized understanding.

## 2021-07-07 NOTE — ED Provider Notes (Signed)
[facility] Provider Note    Event Date/Time   First MD Initiated Contact with Patient 07/07/21 1522     (approximate)  History   Chief Complaint: Loss of Consciousness  HPI  Dillon Schultz is a 76 y.o. male with a past medical history of a first-degree AV block, CHF, hypertension hyperlipidemia who presents to the emergency department for weakness and syncopal events.  According to EMS report patient is coming from home where he has had multiple near syncopal/syncopal episodes over the past 1 week.  EMS states initial blood pressure 88/56.  Patient also states increased swelling to his lower extremities.  Mild dementia history per EMS.  Physical Exam   Triage Vital Signs: ED Triage Vitals  Enc Vitals Group     BP 07/07/21 1437 106/79     Pulse Rate 07/07/21 1437 (!) 49     Resp 07/07/21 1437 18     Temp 07/07/21 1437 (!) 97.4 F (36.3 C)     Temp Source 07/07/21 1437 Oral     SpO2 07/07/21 1437 99 %     Weight 07/07/21 1434 169 lb 12.1 oz (77 kg)     Height 07/07/21 1434 5\' 8"  (1.727 m)     Head Circumference --      Peak Flow --      Pain Score 07/07/21 1434 0     Pain Loc --      Pain Edu? --      Excl. in [place]? --     Most recent vital signs: Vitals:   07/07/21 1700 07/07/21 1730  BP: (!) 189/106 (!) 144/86  Pulse: (!) 146 61  Resp: 15 19  Temp:    SpO2: 94% 94%    General: Awake, no distress.  CV:  Good peripheral perfusion.  Regular rate and rhythm  Resp:  Normal effort.  Equal breath sounds bilaterally.  Abd:  No distention.  Soft, nontender.  No rebound or guarding. Other:  3+ lower extremity edema equal bilaterally.   ED Results / Procedures / Treatments   EKG  EKG viewed and interpreted by myself shows a normal sinus rhythm/sinus bradycardia at 50 bpm with a narrow QRS, normal axis, normal intervals besides slight PR prolongation.  No concerning ST changes.  RADIOLOGY  I have personally reviewed the chest x-ray images.   Lungs appear clear bilaterally.  No concerning findings.  Radiology read pending. Radiology has read the chest x-ray is negative.   MEDICATIONS ORDERED IN ED: Medications - No data to display   IMPRESSION / MDM / [place] / ED COURSE  I reviewed the triage vital signs and the nursing notes.  Patient presents to the emergency department for generalized weakness and multiple syncopal episodes over the past 1 week.  Patient per report has a history of mild dementia.  Patient states he does not know why he is here currently, but denies any complaints.  Patient's vitals in the emergency department are reassuring.  Pulse rate around 55 bpm blood pressure is hypertensive 165/99.  Afebrile.  On examination patient is awake alert no acute distress does have significant lower extremity swelling, but states they are always swollen.  We will check labs and continue to closely monitor.  Differential would include metabolic or electrolyte abnormality, dehydration, orthostatic hypotension, CHF exacerbation, ACS, infectious etiology such as COVID/influenza.  Lab work has resulted showing acute kidney injury with a creatinine of 3.66 baseline creatinine appears to be 1.3-1.4.  This could  account for the patient's intermittent hypotension as he was hypotensive per EMS although currently hypertensive in the emergency department.  Given the patient's multiple syncopal episodes per report as well as acute kidney injury will admit to the hospitalist service for further work-up.  I spoke to the hospitalist regarding the patient, they agree to admit the patient to their service for further evaluation and work-up.  FINAL CLINICAL IMPRESSION(S) / ED DIAGNOSES   Weakness Acute kidney injury   Note:  This document was prepared using Dragon voice recognition software and may include unintentional dictation errors.   Harvest Dark, MD 07/07/21 1752

## 2021-07-07 NOTE — ED Notes (Signed)
Report received from Lindsay, RN

## 2021-07-07 NOTE — ED Notes (Signed)
Called lab to ensure that blood had already been sent prior to orders being placed by previous RN. Lab confirms and states that they have everything that they need currently.

## 2021-07-07 NOTE — Progress Notes (Signed)
Pt is confused, called staff approx every 15 min for reorientation. Pt reoriented multiple times. Pt states 'you know I got memory problems they tell me"

## 2021-07-07 NOTE — ED Triage Notes (Signed)
Patient to ED via [facility] from home for multiple syncopal episodes/weakness x1 week. BP 88/56 with EMS. Increased swelling and weakness noted on the left leg. Per EMS patient could possible be taking too many meds due to patient giving himself meds with history of dementia. Pt also has hx of CHF and HTN.

## 2021-07-07 NOTE — ED Notes (Signed)
Bilateral lower extremities elevated with a pillow

## 2021-07-07 NOTE — ED Notes (Signed)
Care transferred, report given to April, RN. Patient moved to c-pod.

## 2021-07-07 NOTE — Progress Notes (Signed)
Pt removing monitoring equipment frequently and calling out for family. Pt placed back on monitoring equipment and reoriented again to place and situation. Pt states "I thought I heard my sister in that other room".

## 2021-07-07 NOTE — H&P (Signed)
History and Physical    Dillon Schultz DTO:[id number] DOB: 12/04/1945 DOA: 07/07/2021  PCP: [name], MD   Patient coming from: Home  I have personally briefly reviewed patient's old medical records in [place]  Chief Complaint: "I have been falling out"   Most of the history was obtained from patient's son over the phone.  Patient is a poor historian due to underlying history of dementia  HPI: Dillon Schultz is a 76 y.o. male with medical history significant for hypertension, dyslipidemia, first degree AV block, chronic diastolic CHF who was brought into the ER for evaluation of what his son describes as " patient falling out and laying on the ground'. He was unable to tell me if these episodes are associated with LOC. He states that patient has not had any jerking movements involving his extremities, no urinary or fecal incontinence.  Son is also concerned about his bilateral lower extremity swelling. Patient denies having any chest pain, no nausea, no vomiting, no abdominal pain, no changes in his bowel habits, no urinary symptoms. Sodium 138, potassium 4.9, chloride 98, bicarb 30, glucose 106, BUN 68 compared to baseline of 23, creatinine 3.6 compared to baseline of 1.4, calcium 9.5, alkaline phosphatase 77, albumin 3.8, AST 14, ALT 7, total protein 7.3, total bilirubin 0.7, BNP 6.8, white count 3.7, hemoglobin 12.5, hematocrit 39.3, MCV 94, RDW 13.2, platelet count 137 Respiratory viral panel is negative Chest x-ray reviewed by me shows no evidence of acute cardiopulmonary disease Twelve-lead EKG shows sinus rhythm with LVH   ED Course: Patient is a 76 year old male with a history of dementia was brought into the ER for evaluation of " multiple syncopal episodes" which the son describes as him falling out and laying on the ground.  Unable to tell me if there was any associated loss of consciousness. Per EMS patient was hypotensive in the field with an initial blood pressure  reading of 88/56.  Repeat blood pressure readings have been elevated.  Patient also noted to have episodes of bradycardia with heart rate between 49 and 53 bpm. He is also noted to have worsening of his renal function from baseline 1.44 >> 3.66. He will be admitted to the hospital for further evaluation.     Review of Systems: As per HPI otherwise all other systems reviewed and negative.    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 His smoking use included cigarettes. He has never used smokeless tobacco. No history on file for alcohol use and drug use.  Allergies  Allergen Reactions   Metformin Shortness Of Breath    Made him feel "crazy"    Glucophage [Metformin Hcl]     Made him feel "crazy"  History reviewed. No pertinent family history.  Unable to provide history due to dementia   Prior to Admission medications   Medication Sig Start Date End Date Taking? Authorizing Provider  citalopram (CELEXA) 40 MG tablet Take 40 mg by mouth daily. 03/27/21  Yes [provider]  lisinopril-hydrochlorothiazide (ZESTORETIC) 20-25 MG tablet Take 1 tablet by mouth daily. 03/27/21  Yes [provider]  gabapentin (NEURONTIN) 100 MG capsule Take 100 mg by mouth 3 (three) times daily. Patient not taking: Reported on  12/22/2020 12/01/20   [provider]  lisinopril (ZESTRIL) 20 MG tablet Take 20 mg by mouth daily. Patient not taking: Reported on 12/22/2020 12/02/20   [provider]  amLODipine (NORVASC) 10 MG tablet Take 10 mg by mouth daily. Reported on 12/08/2015  12/22/20  [provider]  azelastine (ASTELIN) 137 MCG/SPRAY nasal spray Place 2 sprays into the nose 2 (two) times daily. Reported on 12/08/2015  12/22/20  [provider]  cetirizine (ZYRTEC) 10 MG tablet Take 10 mg by mouth daily. Reported on 12/08/2015  12/22/20  [provider]  furosemide (LASIX) 20 MG tablet Take 20 mg by mouth daily.  12/22/20  [provider]    Physical Exam: Vitals:   07/07/21 1630 07/07/21 1700 07/07/21 1730 07/07/21 1800  BP: (!) 150/88 (!) 189/106 (!) 144/86 (!) 135/94  Pulse: 87 (!) 146 61 67  Resp: 15 15 19 20   Temp:      TempSrc:      SpO2: 95% 94% 94% 94%  Weight:      Height:         Vitals:   07/07/21 1630 07/07/21 1700 07/07/21 1730 07/07/21 1800  BP: (!) 150/88 (!) 189/106 (!) 144/86 (!) 135/94  Pulse: 87 (!) 146 61 67  Resp: 15 15 19 20   Temp:      TempSrc:      SpO2: 95% 94% 94% 94%  Weight:      Height:          Constitutional: Alert and oriented x 2, to person and place. Not in any apparent distress HEENT:      Head: Normocephalic and atraumatic.         Eyes: PERLA, EOMI, Conjunctivae are normal. Sclera is non-icteric.       Mouth/Throat: Mucous membranes are moist.       Neck: Supple with no signs of meningismus. Cardiovascular: Regular rate and rhythm. No murmurs, gallops, or rubs. 2+ symmetrical distal pulses are present . No JVD.  Chronic lymphedema Lt > Rt Respiratory: Respiratory effort normal .Lungs sounds clear bilaterally. No wheezes, crackles, or rhonchi.  Gastrointestinal: Soft, non tender, and non distended with positive bowel sounds.  Genitourinary: No CVA tenderness. Musculoskeletal: Nontender with normal range of  motion in all extremities. No cyanosis, or erythema of extremities. Neurologic:  Face is symmetric. Moving all extremities. No gross focal neurologic deficits . Skin: Skin is warm, dry.  No rash or ulcers Psychiatric: Mood and affect are normal    Labs on Admission: I have personally reviewed following labs and imaging studies  CBC: Recent Labs  Lab 07/07/21 1544  WBC 3.7*  HGB 12.5*  HCT 39.3  MCV 94.0  PLT 149*   Basic Metabolic Panel: Recent Labs  Lab 07/07/21 1544  NA 138  K 4.9  CL 98  CO2 30  GLUCOSE 106*  BUN 68*  CREATININE 3.66*  CALCIUM 9.5   GFR: Estimated Creatinine Clearance: 16.4 mL/min (A) (by C-G formula  based on SCr of 3.66 mg/dL (H)). Liver Function Tests: Recent Labs  Lab 07/07/21 1544  AST 14*  ALT 7  ALKPHOS 77  BILITOT 0.7  PROT 7.3  ALBUMIN 3.8   No results for input(s): LIPASE, AMYLASE in the last 168 hours. No results for input(s): AMMONIA in the last 168 hours. Coagulation Profile: No results for input(s): INR, PROTIME in the last 168 hours. Cardiac Enzymes: No results for input(s): CKTOTAL, CKMB, CKMBINDEX, TROPONINI in the last 168 hours. BNP (last 3 results) No results for input(s): PROBNP in the last 8760 hours. HbA1C: No results for input(s): HGBA1C in the last 72 hours. CBG: No results for input(s): GLUCAP in the last 168 hours. Lipid Profile: No results for input(s): CHOL, HDL, LDLCALC, TRIG, CHOLHDL, LDLDIRECT in the last 72 hours. Thyroid Function Tests: No results for input(s): TSH, T4TOTAL, FREET4, T3FREE, THYROIDAB in the last 72 hours. Anemia Panel: No results for input(s): VITAMINB12, FOLATE, FERRITIN, TIBC, IRON, RETICCTPCT in the last 72 hours. Urine analysis:    Component Value Date/Time   COLORURINE YELLOW (A) 07/07/2021 1643   APPEARANCEUR CLEAR (A) 07/07/2021 1643   APPEARANCEUR Clear 02/01/2012 2026   LABSPEC 1.010 07/07/2021 1643   LABSPEC 1.018 02/01/2012 2026   PHURINE 5.0 07/07/2021 1643    GLUCOSEU NEGATIVE 07/07/2021 1643   GLUCOSEU Negative 02/01/2012 2026   HGBUR NEGATIVE 07/07/2021 1643   BILIRUBINUR NEGATIVE 07/07/2021 1643   BILIRUBINUR Negative 02/01/2012 2026   KETONESUR NEGATIVE 07/07/2021 1643   PROTEINUR NEGATIVE 07/07/2021 1643   NITRITE NEGATIVE 07/07/2021 1643   LEUKOCYTESUR NEGATIVE 07/07/2021 1643   LEUKOCYTESUR Negative 02/01/2012 2026    Radiological Exams on Admission: DG Chest Portable 1 View  Result Date: 07/07/2021 CLINICAL DATA:  Weakness and syncope. EXAM: PORTABLE CHEST 1 VIEW COMPARISON:  Chest x-ray dated August 30, 2016. FINDINGS: The heart size and mediastinal contours are within normal limits. Both lungs are clear. The visualized skeletal structures are unremarkable. IMPRESSION: No active disease. Electronically Signed   By: Titus Dubin M.D.   On: 07/07/2021 16:07     Assessment/Plan Principal Problem:   Syncope and collapse Active Problems:   Dementia with behavioral disturbance   Hypertension   AKI (acute kidney injury) [facility])    Patient is a 76 year old male admitted for evaluation of multiple syncopal episodes as well as an acute kidney injury   Syncope and collapse Unclear etiology Will be related to volume depletion/hypotension from diuretic use versus from symptomatic bradycardia Orthostatic blood pressure checks Obtain 2D echocardiogram to assess LVEF and rule out aortic stenosis Obtain TSH Place patient on cardiac monitor to rule out arrhythmias We will consult cardiology    AKI Most medication induced At baseline patient has a serum creatinine of 1.4 today on admission it is 3.6 Hold lisinopril, HCTZ and furosemide Judicious IV fluid resuscitation Obtain total CK levels to rule out rhabdomyolysis Obtain renal ultrasound to rule out obstructive uropathy Consult nephrology if no improvement in renal function in 24 hours    Dementia Continue citalopram    DVT prophylaxis: Heparin Code Status: full code   Family Communication: Greater than 50% of time was spent discussing patient's condition and plan of care with his son Dillon Schultz over the phone.  Questions and concerns have been addressed CODE STATUS was discussed and patient is a full code Disposition Plan: Back to previous home environment Consults called: Cardiology  Status:At the time of admission, it appears that the appropriate admission status for this patient is inpatient.  This is judged to be reasonable and necessary to provide the required intensity of service to ensure the patient's safety given the presenting symptoms, physical exam findings, and initial radiographic and laboratory data in the context of their comorbid conditions. Patient requires inpatient status due to high intensity of service, high risk for further deterioration and high frequency of surveillance required.     Collier Bullock MD [facility]     07/07/2021, 6:42 PM

## 2021-07-08 ENCOUNTER — Inpatient Hospital Stay
Admit: 2021-07-08 | Discharge: 2021-07-08 | Disposition: A | Discharge status: Home / Self Care | Payer: Medicare Other | Attending: Internal Medicine | Admitting: Internal Medicine

## 2021-07-08 DIAGNOSIS — R55 Syncope and collapse: Secondary | ICD-10-CM | POA: Diagnosis not present

## 2021-07-08 DIAGNOSIS — F03918 Unspecified dementia, unspecified severity, with other behavioral disturbance: Secondary | ICD-10-CM | POA: Diagnosis not present

## 2021-07-08 DIAGNOSIS — N179 Acute kidney failure, unspecified: Principal | ICD-10-CM

## 2021-07-08 DIAGNOSIS — R410 Disorientation, unspecified: Secondary | ICD-10-CM

## 2021-07-08 LAB — CBC
HCT: 37.3 % — ABNORMAL LOW (ref 39.0–52.0)
Hemoglobin: 12.3 g/dL — ABNORMAL LOW (ref 13.0–17.0)
MCH: 29.6 pg (ref 26.0–34.0)
MCHC: 33 g/dL (ref 30.0–36.0)
MCV: 89.9 fL (ref 80.0–100.0)
Platelets: 124 10*3/uL — ABNORMAL LOW (ref 150–400)
RBC: 4.15 MIL/uL — ABNORMAL LOW (ref 4.22–5.81)
RDW: 13 % (ref 11.5–15.5)
WBC: 4.8 10*3/uL (ref 4.0–10.5)
nRBC: 0 % (ref 0.0–0.2)

## 2021-07-08 LAB — ECHOCARDIOGRAM COMPLETE
AR max vel: 2.69 cm2
AV Peak grad: 8.3 mmHg
Ao pk vel: 1.44 m/s
Area-P 1/2: 2.2 cm2
Calc EF: 67.5 %
Height: 68 in
S' Lateral: 3.1 cm
Single Plane A2C EF: 63.3 %
Single Plane A4C EF: 71.5 %
Weight: 2716.07 oz

## 2021-07-08 LAB — BASIC METABOLIC PANEL
Anion gap: 6 (ref 5–15)
BUN: 65 mg/dL — ABNORMAL HIGH (ref 8–23)
CO2: 32 mmol/L (ref 22–32)
Calcium: 9.3 mg/dL (ref 8.9–10.3)
Chloride: 98 mmol/L (ref 98–111)
Creatinine, Ser: 3.22 mg/dL — ABNORMAL HIGH (ref 0.61–1.24)
GFR, Estimated: 19 mL/min — ABNORMAL LOW (ref >60)
Glucose, Bld: 88 mg/dL (ref 70–99)
Potassium: 5.2 mmol/L — ABNORMAL HIGH (ref 3.5–5.1)
Sodium: 136 mmol/L (ref 135–145)

## 2021-07-08 MED ORDER — HALOPERIDOL LACTATE 5 MG/ML IJ SOLN
INTRAMUSCULAR | Status: AC
  Filled 2021-07-08: qty 1

## 2021-07-08 MED ORDER — AMLODIPINE BESYLATE 10 MG PO TABS
10.0000 mg | ORAL_TABLET | Freq: Every day | ORAL | Status: DC
  Administered 2021-07-08 – 2021-07-10 (×3): 10 mg via ORAL
  Filled 2021-07-08: qty 1
  Filled 2021-07-08 (×2): qty 2
  Filled 2021-07-08: qty 1

## 2021-07-08 MED ORDER — HALOPERIDOL LACTATE 5 MG/ML IJ SOLN: 2.5000 mg | Freq: Once | INTRAMUSCULAR | Status: DC

## 2021-07-08 MED ORDER — HALOPERIDOL LACTATE 5 MG/ML IJ SOLN
2.5000 mg | Freq: Once | INTRAMUSCULAR | Status: AC
  Administered 2021-07-08: 2.5 mg via INTRAMUSCULAR

## 2021-07-08 NOTE — Progress Notes (Signed)
*  PRELIMINARY RESULTS* Echocardiogram 2D Echocardiogram has been performed.  Claretta Fraise 07/08/2021, 12:39 PM

## 2021-07-08 NOTE — ED Notes (Addendum)
Pt up out of bed again, attempting to remove iv. Pt states he is leaving. Pt is confused, reoriented again to situation, place and time. Pt moved to bed in front of nursing station to keep an eye on pt. Pants, shirt, wallet and keys placed in belonging bag and hung from iv pole on back of pt's bed, shoes, socks and jacket at head of bed behind pt. Pt informed that he is moving to RN station "so we can keep you safe, we don't want anything to h appen to you". Pt refuses to allow ivf to be reconnected. Pt states "I don't need that shit".

## 2021-07-08 NOTE — Consult Note (Addendum)
[facility] Cardiology  CARDIOLOGY CONSULT NOTE  Patient ID: Dillon Schultz MRN: [id number] DOB/AGE: 1945/07/25 76 y.o.  Admit date: 07/07/2021 Referring Physician [place] Primary Physician [name], MD Primary Cardiologist Jordan Hawks Reason for Consultation Syncope  HPI:  Dillon Schultz is a 76 year old male with a history of hypertension, hyperlipidemia who presents to the hospital because he was found falling out and laying on the ground.  Cardiology is asked to assess for whether this could be syncope.  The patient is unable to say whether or not he lost consciousness during these episodes. He is unable to provide any history as he is quite confused today and states that he wants to go home. He denies shortness of breath or chest pain.   On arrival to the emergency department his labs are notable for a creatinine of 3.22 which is significantly increased from a baseline of 1.4.  EKG shows sinus bradycardia with a first-degree AV block.   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    Past Surgical History:  Procedure Laterality Date   COLONOSCOPY  2010   COLONOSCOPY WITH PROPOFOL N/A 12/16/2015   Procedure: COLONOSCOPY WITH PROPOFOL;  Surgeon: Lucilla Lame, MD;  Location: [place];  Service: Endoscopy;  Laterality: N/A;   POLYPECTOMY  12/16/2015   Procedure: POLYPECTOMY;  Surgeon: Lucilla Lame, MD;  Location: [place];  Service: Endoscopy;;    (Not in a hospital admission)  Social History   Socioeconomic History   Marital status: Married    Spouse name: Not on file   Number of children: Not on file   Years of education: Not on file   Highest education level: Not on  file  Occupational History   Not on file  Tobacco Use   Smoking status: Former    Years: 5.00    Types: Cigarettes    Quit date: 06/26/1967    Years since quitting: 54.0   Smokeless tobacco: Never  Substance and Sexual Activity   Alcohol use: Not on file   Drug use: Not on file   Sexual activity: Not on file  Other Topics Concern   Not on file  Social History Narrative   Not on file   Social Determinants of Health   Financial Resource Strain: Not on file  Food Insecurity: Not on file  Transportation Needs: Not on file  Physical Activity: Not on file  Stress: Not on file  Social Connections: Not on file  Intimate Partner Violence: Not on file    History reviewed. No pertinent family history.    Review of systems complete and found to be negative unless listed above      PHYSICAL EXAM  General: Disheveled. NAD.  HEENT:  Normocephalic and atramatic Neck:  No JVD.  Lungs: Clear bilaterally to auscultation and percussion. Heart: Bradycardic but regular . Normal S1 and S2 without gallops or murmurs.  Abdomen: Bowel sounds are positive, abdomen soft and non-tender  Msk:  Back normal, normal gait. Normal strength and tone for age. Extremities: No clubbing, cyanosis or edema.   Neuro: Alert and oriented  X 3. Psych:  Good affect, responds appropriately  Labs:   Lab Results  Component Value Date   WBC 4.8 07/08/2021   HGB 12.3 (L) 07/08/2021   HCT 37.3 (L) 07/08/2021   MCV 89.9 07/08/2021   PLT 124 (L) 07/08/2021    Recent Labs  Lab 07/07/21 1544 07/08/21 0553  NA 138 136  K 4.9 5.2*  CL 98 98  CO2 30 32  BUN 68* 65*  CREATININE 3.66* 3.22*  CALCIUM 9.5 9.3  PROT 7.3  --   BILITOT 0.7  --   ALKPHOS 77  --   ALT 7  --   AST 14*  --   GLUCOSE 106* 88   Lab Results  Component Value Date   CKTOTAL 91 07/07/2021   CKMB 1.5 02/01/2012   CKMB 1.5 02/01/2012   TROPONINI < 0.02 02/01/2012   No results found for: CHOL No results found for: HDL No results  found for: LDLCALC No results found for: TRIG No results found for: CHOLHDL No results found for: LDLDIRECT    Radiology: US RENAL  Result Date: 07/07/2021 CLINICAL DATA:  Acute kidney injury. EXAM: RENAL / URINARY TRACT ULTRASOUND COMPLETE COMPARISON:  None. FINDINGS: Right Kidney: Renal measurements: 8.4 x 4.3 x 4.6 cm = volume: 86 mL. Mildly increased renal parenchymal echogenicity noted. No mass or hydronephrosis visualized. Left Kidney: Renal measurements: 8.3 x 5.2 x 4.9 cm = volume: 111 mL. Mildly increased renal parenchymal echogenicity noted. No mass or hydronephrosis visualized. Bladder: Appears normal for degree of bladder distention. Other: None. IMPRESSION: Mildly increased renal parenchymal echogenicity, consistent with medical renal disease. No evidence of hydronephrosis. Electronically Signed   By: Marlaine Hind M.D.   On: 07/07/2021 19:18   DG Chest Portable 1 View  Result Date: 07/07/2021 CLINICAL DATA:  Weakness and syncope. EXAM: PORTABLE CHEST 1 VIEW COMPARISON:  Chest x-ray dated August 30, 2016. FINDINGS: The heart size and mediastinal contours are within normal limits. Both lungs are clear. The visualized skeletal structures are unremarkable. IMPRESSION: No active disease. Electronically Signed   By: Titus Dubin M.D.   On: 07/07/2021 16:07    EKG: Sinus bradycardia with first-degree AV block.  LVH.  ASSESSMENT AND PLAN:  Dillon Schultz is a 76 year old male with a history of hypertension, hyperlipidemia who presents to the hospital because he was found falling out and laying on the ground.  Cardiology is asked to assess for whether this could be syncope.  # Fall versus syncope # Acute on chronic Kidney injury Extremely unclear history given his current cognifitive dysfunction. Since admission there is no clear etiology for syncope. EKG shows sinus brady, and telemetry review shows no AV block or significant arrhythmia. Exam is not suggestive of High grade AV stenosis.  Further, it is not clear if the patient had syncope versus just falling. Consider orthostasis in the setting of dehydration given AKI and poor nutritional status. -Maintain patient on telemetry while inpatient -Agree with echocardiogram complete -He will need a cardiac event monitor at the time of discharge - IVF per primary team  Signed: Andrez Grime MD 07/08/2021, 9:17 AM

## 2021-07-08 NOTE — Progress Notes (Signed)
Pt up out of room, has removed iv, pt states he is going to go back upstairs. Pt reoriented to place, time and situation and assisted back to bed. Iv cath pt removed intact. Pt placed back on cardiac monitor.

## 2021-07-08 NOTE — Progress Notes (Addendum)
[place] [facility] [place] NAME: Dillon Schultz    MR#:  [id number]  DATE OF BIRTH:  Jun 09, 1946  SUBJECTIVE:  patient seen in the ER hallway. He is been very restless agitated pulling out IVs. So far according to RN he is pulled out five IVs. He is drinking water. Unable to give IV fluid. Patient would not allow IV placement. No family at bedside. Very confused has dementia at baseline.   REVIEW OF SYSTEMS:   Review of Systems  Unable to perform ROS: Dementia  Tolerating Diet: Tolerating PT:   DRUG ALLERGIES:   Allergies  Allergen Reactions   Metformin Shortness Of Breath    Made him feel "crazy"    Glucophage [Metformin Hcl]     Made him feel "crazy"     VITALS:  Blood pressure (!) 141/79, pulse 91, temperature 98.1 F (36.7 C), temperature source Oral, resp. rate 15, height 5\' 8"  (1.727 m), weight 77 kg, SpO2 96 %.  PHYSICAL EXAMINATION:   Physical Exam  GENERAL:  76 y.o.-year-old patient lying in the bed with no acute distress.  HEENT: Head atraumatic, normocephalic. Oropharynx and nasopharynx clear.  LUNGS: Normal breath sounds bilaterally, no wheezing, rales, rhonchi.  CARDIOVASCULAR: S1, S2 normal. No murmurs, rubs, or gallops.  ABDOMEN: Soft, nontender, nondistended. Bowel sounds present.  EXTREMITIES: No  edema b/l.    NEUROLOGIC: nonfocal PSYCHIATRIC:  patient is alert  SKIN: No obvious rash, lesion, or ulcer.   LABORATORY PANEL:  CBC Recent Labs  Lab 07/08/21 0553  WBC 4.8  HGB 12.3*  HCT 37.3*  PLT 124*    Chemistries  Recent Labs  Lab 07/07/21 1544 07/08/21 0553  NA 138 136  K 4.9 5.2*  CL 98 98  CO2 30 32  GLUCOSE 106* 88  BUN 68* 65*  CREATININE 3.66* 3.22*  CALCIUM 9.5 9.3  AST 14*  --   ALT 7  --   ALKPHOS 77  --   BILITOT 0.7  --    Cardiac Enzymes No results for input(s): TROPONINI in the last 168 hours. RADIOLOGY:  US RENAL  Result Date: 07/07/2021 CLINICAL DATA:  Acute kidney  injury. EXAM: RENAL / URINARY TRACT ULTRASOUND COMPLETE COMPARISON:  None. FINDINGS: Right Kidney: Renal measurements: 8.4 x 4.3 x 4.6 cm = volume: 86 mL. Mildly increased renal parenchymal echogenicity noted. No mass or hydronephrosis visualized. Left Kidney: Renal measurements: 8.3 x 5.2 x 4.9 cm = volume: 111 mL. Mildly increased renal parenchymal echogenicity noted. No mass or hydronephrosis visualized. Bladder: Appears normal for degree of bladder distention. Other: None. IMPRESSION: Mildly increased renal parenchymal echogenicity, consistent with medical renal disease. No evidence of hydronephrosis. Electronically Signed   By: Marlaine Hind M.D.   On: 07/07/2021 19:18   DG Chest Portable 1 View  Result Date: 07/07/2021 CLINICAL DATA:  Weakness and syncope. EXAM: PORTABLE CHEST 1 VIEW COMPARISON:  Chest x-ray dated August 30, 2016. FINDINGS: The heart size and mediastinal contours are within normal limits. Both lungs are clear. The visualized skeletal structures are unremarkable. IMPRESSION: No active disease. Electronically Signed   By: Titus Dubin M.D.   On: 07/07/2021 16:07   ASSESSMENT AND PLAN:   Dillon Schultz is a 76 y.o. male with medical history significant for hypertension, dyslipidemia, first degree AV block, chronic diastolic CHF who was brought into the ER for evaluation of what his son describes as " patient falling out and laying on the ground"  patient was  found to have elevated creatinine of 3.6. Baseline is 1.4.   Syncope and collapse suspect due to severe dehydration Likely related to volume depletion/hypotension from diuretic use  --Obtain 2D echocardiogram to assess LVEF and rule out aortic stenosis --consult with Dr Corky Sox cardiology-- recommends Monitor arrhythmia and will need monitor at discharge    Acute Renal failure CKD-II --Most medication induced/poor po intake --baseline creat 1.4 --creat 3.66--3.3 --Hold lisinopril, HCTZ and furosemide (recently started 40 mg  qd for leg edema per PCP) --received  IV fluid resuscitation-- patient pulled out five IVs. Unable to give IV fluids. Encouraging oral fluid intake. -- renal ultrasound  no  obstructive uropathy --Consult nephrology if no improvement in renal function in 24 hours   Dementia Continue citalopram  Procedures: Family communication :unable to reach sarah wife on the phone listed. Spoke  with david Consults : CODE STATUS: full DVT Prophylaxis :heparin Level of care: Telemetry Cardiac Status is: Inpatient  Remains inpatient appropriate because: Acute on CKDII        TOTAL TIME TAKING CARE OF THIS PATIENT: 25 minutes.  >50% time spent on counselling and coordination of care  Note: This dictation was prepared with Dragon dictation along with smaller phrase technology. Any transcriptional errors that result from this process are unintentional.  Fritzi Mandes M.D    [facility]   CC: Primary care physician; [name], MD Patient ID: Dillon Schultz, male   DOB: [birth year], 76 y.o.   MRN: [id number]

## 2021-07-08 NOTE — ED Notes (Signed)
Pt has now pulled out 4-5 IVs.  Hospitalist aware and encouraging [place] to provide oral hydration.

## 2021-07-09 DIAGNOSIS — R55 Syncope and collapse: Secondary | ICD-10-CM | POA: Diagnosis not present

## 2021-07-09 DIAGNOSIS — R410 Disorientation, unspecified: Secondary | ICD-10-CM | POA: Insufficient documentation

## 2021-07-09 LAB — GLUCOSE, CAPILLARY
Glucose-Capillary: 40 mg/dL — CL (ref 70–99)
Glucose-Capillary: 64 mg/dL — ABNORMAL LOW (ref 70–99)

## 2021-07-09 LAB — CBG MONITORING, ED
Glucose-Capillary: 37 mg/dL — CL (ref 70–99)
Glucose-Capillary: 89 mg/dL (ref 70–99)
Glucose-Capillary: 149 mg/dL — ABNORMAL HIGH (ref 70–99)
Glucose-Capillary: 76 mg/dL (ref 70–99)
Glucose-Capillary: 106 mg/dL — ABNORMAL HIGH (ref 70–99)
Glucose-Capillary: 44 mg/dL — CL (ref 70–99)
Glucose-Capillary: 100 mg/dL — ABNORMAL HIGH (ref 70–99)
Glucose-Capillary: 59 mg/dL — ABNORMAL LOW (ref 70–99)

## 2021-07-09 MED ORDER — DEXTROSE 50 % IV SOLN
INTRAVENOUS | Status: AC
  Administered 2021-07-09: 50 mL via INTRAVENOUS
  Filled 2021-07-09: qty 50

## 2021-07-09 MED ORDER — ZIPRASIDONE MESYLATE 20 MG IM SOLR
10.0000 mg | Freq: Once | INTRAMUSCULAR | Status: AC
  Administered 2021-07-09: 10 mg via INTRAMUSCULAR

## 2021-07-09 MED ORDER — DEXTROSE 10 % IV SOLN: INTRAVENOUS | Status: DC

## 2021-07-09 MED ORDER — DEXTROSE 50 % IV SOLN
1.0000 | Freq: Once | INTRAVENOUS | Status: AC
  Administered 2021-07-09: 50 mL via INTRAVENOUS
  Filled 2021-07-09: qty 50

## 2021-07-09 MED ORDER — DIVALPROEX SODIUM 125 MG PO DR TAB
125.0000 mg | DELAYED_RELEASE_TABLET | Freq: Two times a day (BID) | ORAL | Status: DC
  Administered 2021-07-09 – 2021-07-10 (×3): 125 mg via ORAL
  Filled 2021-07-09 (×6): qty 1

## 2021-07-09 MED ORDER — DEXTROSE 50 % IV SOLN
1.0000 | Freq: Once | INTRAVENOUS | Status: AC
Start: 2021-07-09 — End: 2021-07-09

## 2021-07-09 MED ORDER — OLANZAPINE 2.5 MG PO TABS
2.5000 mg | ORAL_TABLET | Freq: Two times a day (BID) | ORAL | Status: DC | PRN
  Administered 2021-07-09: 2.5 mg via ORAL
  Filled 2021-07-09 (×2): qty 1

## 2021-07-09 MED ORDER — OLANZAPINE 10 MG IM SOLR
2.5000 mg | Freq: Two times a day (BID) | INTRAMUSCULAR | Status: DC | PRN
  Administered 2021-07-10 – 2021-07-11 (×4): 2.5 mg via INTRAMUSCULAR
  Filled 2021-07-09 (×5): qty 10

## 2021-07-09 MED ORDER — HYDRALAZINE HCL 25 MG PO TABS
25.0000 mg | ORAL_TABLET | Freq: Two times a day (BID) | ORAL | Status: DC
  Administered 2021-07-09 – 2021-07-10 (×3): 25 mg via ORAL
  Filled 2021-07-09 (×4): qty 1

## 2021-07-09 MED ORDER — SODIUM CHLORIDE 0.9 % IV BOLUS
500.0000 mL | Freq: Once | INTRAVENOUS | Status: AC
Start: 2021-07-09 — End: 2021-07-09
  Administered 2021-07-09: 500 mL via INTRAVENOUS

## 2021-07-09 NOTE — ED Notes (Signed)
Pt's BGL 44, 1 amp of D50 administered at this time. MD Joellyn Quails at this time to inform.

## 2021-07-09 NOTE — Progress Notes (Signed)
Early this morning, blood sugar result -37. Due to patient previously refusing and pulling out IV 5x, patient has no IV access. Therefore two cups of orange Juice with packets of sugar were given to patient.  Blood sugar rechecked by oncoming Nurse and  result was 89.

## 2021-07-09 NOTE — Progress Notes (Signed)
PT Cancellation Note  Patient Details Name: Dillon Schultz MRN: [id number] DOB: 06-30-1945   Cancelled Treatment:    Reason Eval/Treat Not Completed: Other (comment) (Patient consult received and reviewed. Per nursing patient is currently sedated from medication. Will attempt at later time when appropriate/available.)   Janna Arch, PT, DPT  07/09/2021, 8:26 AM

## 2021-07-09 NOTE — Progress Notes (Signed)
[facility] [place] NAME: Dillon Schultz    MR#:  [id number]  DATE OF BIRTH:  1945-08-29  SUBJECTIVE:  patient seen earlier was quite sleepy. Sugar check reveals 36. Patient received one amp of D50 after IV was obtained. Was very agitated and restless was. Sitter at bedside. Received dose of Haldol and Geodon. Patient has been IVCed  has advanced dementia at baseline. No family in the ER  REVIEW OF SYSTEMS:   Review of Systems  Unable to perform ROS: Dementia  Tolerating Diet: Tolerating PT:   DRUG ALLERGIES:   Allergies  Allergen Reactions   Metformin Shortness Of Breath    Made him feel "crazy"    Glucophage [Metformin Hcl]     Made him feel "crazy"     VITALS:  Blood pressure (!) 144/82, pulse (!) 51, temperature 98.1 F (36.7 C), temperature source Oral, resp. rate 16, height 5\' 8"  (1.727 m), weight 77 kg, SpO2 96 %.  PHYSICAL EXAMINATION:   Physical Exam  GENERAL:  76 y.o.-year-old patient lying in the bed with no acute distress. Poorly nourished HEENT: Head atraumatic, normocephalic. Oropharynx and nasopharynx clear.  LUNGS: Normal breath sounds bilaterally, no wheezing, rales, rhonchi.  CARDIOVASCULAR: S1, S2 normal. No murmurs, rubs, or gallops.  ABDOMEN: Soft, nontender, nondistended. Bowel sounds present.  EXTREMITIES: bilateral chronic leg swelling with severely dry skin and poor hygiene NEUROLOGIC: nonfocal PSYCHIATRIC:  patient is lethargic SKIN: No obvious rash, lesion, or ulcer.   LABORATORY PANEL:  CBC Recent Labs  Lab 07/08/21 0553  WBC 4.8  HGB 12.3*  HCT 37.3*  PLT 124*     Chemistries  Recent Labs  Lab 07/07/21 1544 07/08/21 0553  NA 138 136  K 4.9 5.2*  CL 98 98  CO2 30 32  GLUCOSE 106* 88  BUN 68* 65*  CREATININE 3.66* 3.22*  CALCIUM 9.5 9.3  AST 14*  --   ALT 7  --   ALKPHOS 77  --   BILITOT 0.7  --     Cardiac Enzymes No results for input(s): TROPONINI in the last 168  hours. RADIOLOGY:  US RENAL  Result Date: 07/07/2021 CLINICAL DATA:  Acute kidney injury. EXAM: RENAL / URINARY TRACT ULTRASOUND COMPLETE COMPARISON:  None. FINDINGS: Right Kidney: Renal measurements: 8.4 x 4.3 x 4.6 cm = volume: 86 mL. Mildly increased renal parenchymal echogenicity noted. No mass or hydronephrosis visualized. Left Kidney: Renal measurements: 8.3 x 5.2 x 4.9 cm = volume: 111 mL. Mildly increased renal parenchymal echogenicity noted. No mass or hydronephrosis visualized. Bladder: Appears normal for degree of bladder distention. Other: None. IMPRESSION: Mildly increased renal parenchymal echogenicity, consistent with medical renal disease. No evidence of hydronephrosis. Electronically Signed   By: Marlaine Hind M.D.   On: 07/07/2021 19:18   DG Chest Portable 1 View  Result Date: 07/07/2021 CLINICAL DATA:  Weakness and syncope. EXAM: PORTABLE CHEST 1 VIEW COMPARISON:  Chest x-ray dated August 30, 2016. FINDINGS: The heart size and mediastinal contours are within normal limits. Both lungs are clear. The visualized skeletal structures are unremarkable. IMPRESSION: No active disease. Electronically Signed   By: Titus Dubin M.D.   On: 07/07/2021 16:07   ECHOCARDIOGRAM COMPLETE  Result Date: 07/08/2021    ECHOCARDIOGRAM REPORT   Patient Name:   Dillon Schultz Date of Exam: 07/08/2021 Medical Rec #:  [id number]    Height:       68.0 in Accession #:    [id number]  Weight:       169.8 lb Date of Birth:  11-15-1945    BSA:          1.906 m Patient Age:    76 years     BP:           141/79 mmHg Patient Gender: M            HR:           53 bpm. Exam Location:  [facility] Procedure: 2D Echo and Strain Analysis Indications:     Syncope R55  History:         Patient has no prior history of Echocardiogram examinations.  Sonographer:     Kathlen Brunswick RDCS Referring Phys:  [id number] AGBATA Diagnosing Phys: Donnelly Angelica  Sonographer Comments: Global longitudinal strain was attempted. IMPRESSIONS  1. Left  ventricular ejection fraction, by estimation, is 60 to 65%. The left ventricle has normal function. The left ventricle has no regional wall motion abnormalities. Left ventricular diastolic parameters were normal.  2. Right ventricular systolic function is normal. The right ventricular size is normal.  3. The mitral valve is normal in structure. No evidence of mitral valve regurgitation. No evidence of mitral stenosis.  4. The aortic valve is normal in structure. Aortic valve regurgitation is not visualized. No aortic stenosis is present.  5. The inferior vena cava is normal in size with greater than 50% respiratory variability, suggesting right atrial pressure of 3 mmHg. FINDINGS  Left Ventricle: Left ventricular ejection fraction, by estimation, is 60 to 65%. The left ventricle has normal function. The left ventricle has no regional wall motion abnormalities. The left ventricular internal cavity size was normal in size. There is  no left ventricular hypertrophy. Left ventricular diastolic parameters were normal. Right Ventricle: The right ventricular size is normal. No increase in right ventricular wall thickness. Right ventricular systolic function is normal. Left Atrium: Left atrial size was normal in size. Right Atrium: Right atrial size was normal in size. Pericardium: There is no evidence of pericardial effusion. Mitral Valve: The mitral valve is normal in structure. No evidence of mitral valve regurgitation. No evidence of mitral valve stenosis. Tricuspid Valve: The tricuspid valve is normal in structure. Tricuspid valve regurgitation is trivial. No evidence of tricuspid stenosis. Aortic Valve: The aortic valve is normal in structure. Aortic valve regurgitation is not visualized. No aortic stenosis is present. Aortic valve peak gradient measures 8.3 mmHg. Pulmonic Valve: The pulmonic valve was normal in structure. Pulmonic valve regurgitation is not visualized. No evidence of pulmonic stenosis. Aorta: The  aortic root is normal in size and structure. Venous: The inferior vena cava is normal in size with greater than 50% respiratory variability, suggesting right atrial pressure of 3 mmHg. IAS/Shunts: No atrial level shunt detected by color flow Doppler.  LEFT VENTRICLE PLAX 2D LVIDd:         4.60 cm      Diastology LVIDs:         3.10 cm      LV e' medial:    8.05 cm/s LV PW:         1.10 cm      LV E/e' medial:  6.3 LV IVS:        0.90 cm      LV e' lateral:   7.83 cm/s LVOT diam:     2.20 cm      LV E/e' lateral: 6.5 LV SV:  71 LV SV Index:   37 LVOT Area:     3.80 cm  LV Volumes (MOD) LV vol d, MOD A2C: 114.0 ml LV vol d, MOD A4C: 73.1 ml LV vol s, MOD A2C: 41.8 ml LV vol s, MOD A4C: 20.8 ml LV SV MOD A2C:     72.2 ml LV SV MOD A4C:     73.1 ml LV SV MOD BP:      64.7 ml RIGHT VENTRICLE RV Basal diam:  3.10 cm RV S prime:     13.70 cm/s TAPSE (M-mode): 2.4 cm LEFT ATRIUM             Index        RIGHT ATRIUM           Index LA diam:        3.40 cm 1.78 cm/m   RA Area:     12.90 cm LA Vol (A2C):   32.8 ml 17.21 ml/m  RA Volume:   32.50 ml  17.05 ml/m LA Vol (A4C):   19.4 ml 10.18 ml/m LA Biplane Vol: 25.9 ml 13.59 ml/m  AORTIC VALVE AV Area (Vmax): 2.69 cm AV Vmax:        144.00 cm/s AV Peak Grad:   8.3 mmHg LVOT Vmax:      102.00 cm/s LVOT Vmean:     61.900 cm/s LVOT VTI:       0.188 m  AORTA Ao Root diam: 3.20 cm Ao Asc diam:  3.30 cm MITRAL VALVE               TRICUSPID VALVE MV Area (PHT): 2.20 cm    TV Peak grad:   19.4 mmHg MV Decel Time: 345 msec    TV Vmax:        2.20 m/s MV E velocity: 51.00 cm/s MV A velocity: 81.40 cm/s  SHUNTS MV E/A ratio:  0.63        Systemic VTI:  0.19 m                            Systemic Diam: 2.20 cm Donnelly Angelica Electronically signed by Donnelly Angelica Signature Date/Time: 07/08/2021/1:57:02 PM    Final    ASSESSMENT AND PLAN:   Dillon Schultz is a 76 y.o. male with medical history significant for hypertension, dyslipidemia, first degree AV block, chronic diastolic CHF  who was brought into the ER for evaluation of what his son describes as " patient falling out and laying on the ground"  patient was found to have elevated creatinine of 3.6. Baseline is 1.4.  Syncope and collapse suspect due to severe dehydration Likely related to volume depletion/hypotension from diuretic use  --Obtain 2D echocardiogram to assess LVEF and rule out aortic stenosis --consult with Dr Corky Sox cardiology-- recommends Monitor arrhythmia and will need monitor at discharge--he may not keep it on due to his severe dementia    Acute Renal failure CKD-IIIA --Most medication induced/poor po intake --baseline creat 1.4 --creat 3.66--3.2--BMP in am --Hold lisinopril, HCTZ and furosemide (recently started 40 mg qd for leg edema per PCP) --received  IV fluid resuscitation-- patient pulled out five IVs. Unable to give IV fluids. Encouraging oral fluid intake. -- renal ultrasound  no  obstructive uropathy --Consult nephrology if no improvement in renal function in 24 hours --1/15-- will try to give IV normal saline bolus today. Patient gets agitated and does not stay in place and hands not able to  give continuous IVF  Acute metabolic encephalopathy with severe agitation, acute delirium Dementia -- patient was placed under IVC last night. -- Sitter at bedside -- psychiatry consultation placed. Started on Zyprexa and Depakote --Continue citalopram  Hypoglycemia -- patient sugar dropped down to 36. RN was able to get IV D50. Patient thereafter drank juice and eight banana. Sugar at 76 -- continue sugar check every two hours for now.  Palliative care for goals of care. 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  >50% time spent on counselling and coordination of care  Note: This dictation was prepared with Dragon dictation along with smaller phrase technology. Any transcriptional errors that result from this process are unintentional.  Fritzi Mandes M.D    [facility]   CC: Primary care physician; [name], MD Patient ID: Dillon Schultz, male   DOB: Apr 20, 1946, 76 y.o.   MRN: [id number]

## 2021-07-09 NOTE — Progress Notes (Signed)
Numerous times during the shift, patient refused to stay in the bed and on countless occassions Nurse reminded pt that he was in the hospital. Pt was very concerned about where his clothes were and wanted family to bring them to the hospital. Pt continuously asked why he is at the hospital and asking where his car is at.  Until around 0420 pt got back out of bed for he wanted to go to the restroom and have a BM. Encouraged pt to use the bedside commode but he refused. Pt again states he wants to use the restroom and does not need anyone to assist with any part of his care.  Pt's agitated demeanor and voice began to escalate to the point of absolute refusal, non-compliance that we felt pt would become combative. Explained safety measures to patient and continued to try to reassure him that we want him to be safe. Contacted Charge Nurse to make her aware of what the patient was doing. Patient begin to walk out of room into the hallway while Nurse and Nurse Tech guided patient to the hallway restroom. Patient went into the restroom. When pt was finished using the restroom, Pt stood in the doorway of the restroom. Pt continued to be upset to the point of being very irate for not taking him to his car and also continuing to ask why he is not at home and where are his clothes. Three Security guards were summoned to assist staff. Staff and Security guards were able to finally guide pt back to his room. Pt refused to sit down and decided to stand at the doorway to his room. Confusion increased even further than anticipated.  Son- [name] was contacted as a voice of reason for the patient. Patient continued to refuse and yell firmly of the same concerns above. Orders given for an antipsychotic med to be given and was administered. Son -[name] continued to talk with Pt on the phone during the administration of  the med. After the therapeutic effects begin to work, [employer] assisted and helped staff get Pt  in a recliner  chair in which patient agreed to sit in after numerous attempts.   At that moment, patient remained in recliner chair and is demeanor is now calm.

## 2021-07-09 NOTE — ED Notes (Signed)
Pt was trying to get out of bed, pt stated "I need to use the bathroom". This Probation officer called RN Rashida for assistance to put pt on bedside commode. Once pt was out of the bed he stated "I need to go to my car" and proceeded to walk into hallway. Writer called Charge RN April and pt was redirected towards the bathroom in the hallway. Pt then went into the bathroom and when he was finished and opened the door, he stood in the bathroom doorway refusing to go back to bed.

## 2021-07-09 NOTE — ED Notes (Signed)
Pt BGL 36 at this time. This RN established IV access, administered 1amp of D50 and notified MD

## 2021-07-09 NOTE — ED Notes (Signed)
Pt given apple juice and eating a banana

## 2021-07-09 NOTE — ED Notes (Signed)
Pt has been refusing care and attempting to leave since 0420. Pt is confused and at this time does not appear to be able to make safe decicions. Pt is unsteady on feet, eventually was compliant to sit in chair for safety. Sharion Settler arrived around 5 am to assess pt and ordered im haldol. Pt speaking to son david on phone, son attempting to reason with pt and encourage pt to remain in hospital. Sharion Settler, np ordered emergency physical hold to administer im medication. Pt's arms held by Animal nutritionist x2 for shot then immediately released in less than 20 seconds. No s/s of cms compromise noted during manual arm hold. Pt remains on phone with security staff x3 and nursing staff x2 in room attempting to get pt to remain in building.

## 2021-07-09 NOTE — ED Notes (Signed)
Pt given grape juice and chocolate ice cream at this time. Pt alert enough to eat and drink independently.

## 2021-07-09 NOTE — Progress Notes (Signed)
Patient arrived to room 246 in NAD, VS stable and patient free from pain. Patient oriented to room and sitter at bedside.

## 2021-07-09 NOTE — Consult Note (Signed)
[facility] Cardiology  CARDIOLOGY CONSULT NOTE  Patient ID: Dillon Schultz MRN: [id number] DOB/AGE: [birth year] 76 y.o.  Admit date: 07/07/2021 Referring Physician [place] Primary Physician [name], MD Primary Cardiologist Jordan Hawks Reason for Consultation Syncope  HPI:  Dillon Schultz is a 76 year old male with a history of hypertension, hyperlipidemia who presents to the hospital because he was found falling out and laying on the ground.  Cardiology is asked to assess for whether this could be syncope. No obtainable history d/t mental status. Consider orthostatic, hypoglycemic, or less likely cardiogenic.   Interval history - Remains confused this morning.  - Does not wake to voice or tactile stimuli.  - Blood sugar reportedly 38 this morning.   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    Past Surgical History:  Procedure Laterality Date   COLONOSCOPY  2010   COLONOSCOPY WITH PROPOFOL N/A 12/16/2015   Procedure: COLONOSCOPY WITH PROPOFOL;  Surgeon: Lucilla Lame, MD;  Location: [place];  Service: Endoscopy;  Laterality: N/A;   POLYPECTOMY  12/16/2015   Procedure: POLYPECTOMY;  Surgeon: Lucilla Lame, MD;  Location: [place];  Service: Endoscopy;;    (Not in a hospital admission)  Social History   Socioeconomic History   Marital status: Married    Spouse name: Not on file   Number of children: Not on file   Years of education: Not on file   Highest education level: Not on file  Occupational History   Not on file  Tobacco Use   Smoking status: Former    Years: 5.00    Types: Cigarettes    Quit date: 06/26/1967    Years since quitting: 54.0   Smokeless  tobacco: Never  Substance and Sexual Activity   Alcohol use: Not on file   Drug use: Not on file   Sexual activity: Not on file  Other Topics Concern   Not on file  Social History Narrative   Not on file   Social Determinants of Health   Financial Resource Strain: Not on file  Food Insecurity: Not on file  Transportation Needs: Not on file  Physical Activity: Not on file  Stress: Not on file  Social Connections: Not on file  Intimate Partner Violence: Not on file    History reviewed. No pertinent family history.    Review of systems complete and found to be negative unless listed above      PHYSICAL EXAM  General: Disheveled. NAD.  HEENT:  Normocephalic and atramatic Neck:  No JVD.  Lungs: Clear bilaterally to auscultation and percussion. Heart: Bradycardic but regular . Normal S1 and S2 without gallops or murmurs.  Abdomen: Bowel sounds are positive, abdomen soft and non-tender  Msk:  Back normal, normal gait. Normal strength and tone for age. Extremities: No clubbing, cyanosis or edema.   Neuro: Alert and oriented X 3. Psych:  Good affect, responds appropriately  Labs:   Lab Results  Component Value Date   WBC 4.8 07/08/2021   HGB 12.3 (L) 07/08/2021   HCT 37.3 (L) 07/08/2021   MCV 89.9 07/08/2021  PLT 124 (L) 07/08/2021    Recent Labs  Lab 07/07/21 1544 07/08/21 0553  NA 138 136  K 4.9 5.2*  CL 98 98  CO2 30 32  BUN 68* 65*  CREATININE 3.66* 3.22*  CALCIUM 9.5 9.3  PROT 7.3  --   BILITOT 0.7  --   ALKPHOS 77  --   ALT 7  --   AST 14*  --   GLUCOSE 106* 88    Lab Results  Component Value Date   CKTOTAL 91 07/07/2021   CKMB 1.5 02/01/2012   CKMB 1.5 02/01/2012   TROPONINI < 0.02 02/01/2012    No results found for: CHOL No results found for: HDL No results found for: LDLCALC No results found for: TRIG No results found for: CHOLHDL No results found for: LDLDIRECT    Radiology: US RENAL  Result Date: 07/07/2021 CLINICAL DATA:  Acute  kidney injury. EXAM: RENAL / URINARY TRACT ULTRASOUND COMPLETE COMPARISON:  None. FINDINGS: Right Kidney: Renal measurements: 8.4 x 4.3 x 4.6 cm = volume: 86 mL. Mildly increased renal parenchymal echogenicity noted. No mass or hydronephrosis visualized. Left Kidney: Renal measurements: 8.3 x 5.2 x 4.9 cm = volume: 111 mL. Mildly increased renal parenchymal echogenicity noted. No mass or hydronephrosis visualized. Bladder: Appears normal for degree of bladder distention. Other: None. IMPRESSION: Mildly increased renal parenchymal echogenicity, consistent with medical renal disease. No evidence of hydronephrosis. Electronically Signed   By: Marlaine Hind M.D.   On: 07/07/2021 19:18   DG Chest Portable 1 View  Result Date: 07/07/2021 CLINICAL DATA:  Weakness and syncope. EXAM: PORTABLE CHEST 1 VIEW COMPARISON:  Chest x-ray dated August 30, 2016. FINDINGS: The heart size and mediastinal contours are within normal limits. Both lungs are clear. The visualized skeletal structures are unremarkable. IMPRESSION: No active disease. Electronically Signed   By: Titus Dubin M.D.   On: 07/07/2021 16:07   ECHOCARDIOGRAM COMPLETE  Result Date: 07/08/2021    ECHOCARDIOGRAM REPORT   Patient Name:   Dillon Schultz Date of Exam: 07/08/2021 Medical Rec #:  [id number]    Height:       68.0 in Accession #:    [id number]   Weight:       169.8 lb Date of Birth:  [birth year]    BSA:          1.906 m Patient Age:    48 years     BP:           141/79 mmHg Patient Gender: M            HR:           53 bpm. Exam Location:  [facility] Procedure: 2D Echo and Strain Analysis Indications:     Syncope R55  History:         Patient has no prior history of Echocardiogram examinations.  Sonographer:     Kathlen Brunswick RDCS Referring Phys:  [id number] AGBATA Diagnosing Phys: Donnelly Angelica  Sonographer Comments: Global longitudinal strain was attempted. IMPRESSIONS  1. Left ventricular ejection fraction, by estimation, is 60 to 65%. The left ventricle  has normal function. The left ventricle has no regional wall motion abnormalities. Left ventricular diastolic parameters were normal.  2. Right ventricular systolic function is normal. The right ventricular size is normal.  3. The mitral valve is normal in structure. No evidence of mitral valve regurgitation. No evidence of mitral stenosis.  4. The aortic valve is normal in structure. Aortic valve regurgitation is  not visualized. No aortic stenosis is present.  5. The inferior vena cava is normal in size with greater than 50% respiratory variability, suggesting right atrial pressure of 3 mmHg. FINDINGS  Left Ventricle: Left ventricular ejection fraction, by estimation, is 60 to 65%. The left ventricle has normal function. The left ventricle has no regional wall motion abnormalities. The left ventricular internal cavity size was normal in size. There is  no left ventricular hypertrophy. Left ventricular diastolic parameters were normal. Right Ventricle: The right ventricular size is normal. No increase in right ventricular wall thickness. Right ventricular systolic function is normal. Left Atrium: Left atrial size was normal in size. Right Atrium: Right atrial size was normal in size. Pericardium: There is no evidence of pericardial effusion. Mitral Valve: The mitral valve is normal in structure. No evidence of mitral valve regurgitation. No evidence of mitral valve stenosis. Tricuspid Valve: The tricuspid valve is normal in structure. Tricuspid valve regurgitation is trivial. No evidence of tricuspid stenosis. Aortic Valve: The aortic valve is normal in structure. Aortic valve regurgitation is not visualized. No aortic stenosis is present. Aortic valve peak gradient measures 8.3 mmHg. Pulmonic Valve: The pulmonic valve was normal in structure. Pulmonic valve regurgitation is not visualized. No evidence of pulmonic stenosis. Aorta: The aortic root is normal in size and structure. Venous: The inferior vena cava is  normal in size with greater than 50% respiratory variability, suggesting right atrial pressure of 3 mmHg. IAS/Shunts: No atrial level shunt detected by color flow Doppler.  LEFT VENTRICLE PLAX 2D LVIDd:         4.60 cm      Diastology LVIDs:         3.10 cm      LV e' medial:    8.05 cm/s LV PW:         1.10 cm      LV E/e' medial:  6.3 LV IVS:        0.90 cm      LV e' lateral:   7.83 cm/s LVOT diam:     2.20 cm      LV E/e' lateral: 6.5 LV SV:         71 LV SV Index:   37 LVOT Area:     3.80 cm  LV Volumes (MOD) LV vol d, MOD A2C: 114.0 ml LV vol d, MOD A4C: 73.1 ml LV vol s, MOD A2C: 41.8 ml LV vol s, MOD A4C: 20.8 ml LV SV MOD A2C:     72.2 ml LV SV MOD A4C:     73.1 ml LV SV MOD BP:      64.7 ml RIGHT VENTRICLE RV Basal diam:  3.10 cm RV S prime:     13.70 cm/s TAPSE (M-mode): 2.4 cm LEFT ATRIUM             Index        RIGHT ATRIUM           Index LA diam:        3.40 cm 1.78 cm/m   RA Area:     12.90 cm LA Vol (A2C):   32.8 ml 17.21 ml/m  RA Volume:   32.50 ml  17.05 ml/m LA Vol (A4C):   19.4 ml 10.18 ml/m LA Biplane Vol: 25.9 ml 13.59 ml/m  AORTIC VALVE AV Area (Vmax): 2.69 cm AV Vmax:        144.00 cm/s AV Peak Grad:   8.3 mmHg LVOT Vmax:  102.00 cm/s LVOT Vmean:     61.900 cm/s LVOT VTI:       0.188 m  AORTA Ao Root diam: 3.20 cm Ao Asc diam:  3.30 cm MITRAL VALVE               TRICUSPID VALVE MV Area (PHT): 2.20 cm    TV Peak grad:   19.4 mmHg MV Decel Time: 345 msec    TV Vmax:        2.20 m/s MV E velocity: 51.00 cm/s MV A velocity: 81.40 cm/s  SHUNTS MV E/A ratio:  0.63        Systemic VTI:  0.19 m                            Systemic Diam: 2.20 cm Donnelly Angelica Electronically signed by Donnelly Angelica Signature Date/Time: 07/08/2021/1:57:02 PM    Final     EKG: Sinus bradycardia with first-degree AV block.  LVH.  ASSESSMENT AND PLAN:  Dillon Schultz is a 76 year old male with a history of hypertension, hyperlipidemia who presents to the hospital because he was found falling out and laying on the  ground.  Cardiology is asked to assess for whether this could be syncope.  # Fall versus syncope # Acute on chronic Kidney injury Extremely unclear history given his current cognifitive dysfunction. Since admission there is no clear etiology for syncope. EKG shows sinus brady, and telemetry review shows no AV block or significant arrhythmia. Echo shows normal RV/LV function, no AS. Further, it is not clear if the patient had syncope versus just falling. Consider orthostasis or hypoglycemia in the setting of dehydration given AKI and poor nutritional status. -Maintain patient on telemetry while inpatient - IVF per primary team - we can consider event monitor at DC, but his mental status may not allow for this to be completed.   Signed: Andrez Grime MD 07/09/2021, 10:45 AM

## 2021-07-09 NOTE — Consult Note (Addendum)
[facility] Consult   Reason for Consult:  agitation Referring Physician:  Dr Posey Pronto Patient Identification: Dillon Schultz MRN:  [id number] Principal Diagnosis: Syncope and collapse Diagnosis:  Principal Problem:   Syncope and collapse Active Problems:   Dementia with behavioral disturbance   Hypertension   AKI (acute kidney injury) ([place])   Total Time spent with patient: 45 minutes  Subjective:   Dillon Schultz is a 76 y.o. male patient admitted with syncope, history of dementia with agitation.  HPI:  76 yo male presented with syncope and falls, history of dementia.  Consult placed for agitation.  On assessment, the client is calm and cooperative sitting in a recliner.  The sitter reported that he keeps saying he needs to go home and get to work along with getting his wife to the hospital.  Agitation medications PRN placed.  His spouse was contacted, [name], who directed me to her son, Dillon Schultz who was in the home.  [name] is his father's healthcare POA and is agreeable to start a low dose of Depakote BID to assist with his agitation in the hopes to avoid PRNs with CV effects, history of AV block.  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 No pertinent family history. Family Psychiatric  History: unknown Social History:  Social History   Substance and Sexual Activity  Alcohol Use None     Social History   Substance and Sexual Activity  Drug Use Not on file    Social History   Socioeconomic History   Marital status: Married    Spouse name: Not on file   Number of children: Not on file   Years of education: Not on file   Highest education level: Not on file  Occupational History   Not on file  Tobacco Use   Smoking status: Former    Years: 5.00    Types: Cigarettes    Quit date: 06/26/1967    Years since quitting: 54.0   Smokeless tobacco: Never  Substance and Sexual Activity   Alcohol use: Not on file   Drug use: Not on file   Sexual activity: Not on file  Other Topics Concern   Not on file  Social History Narrative   Not on file   Social Determinants of Health   Financial Resource Strain: Not on file  Food Insecurity: Not on file  Transportation Needs: Not on file  Physical Activity: Not on file  Stress: Not on file  Social Connections: Not on file   Additional Social History:    Allergies:   Allergies  Allergen Reactions   Metformin Shortness Of Breath    Made him feel "crazy"    Glucophage [Metformin Hcl]     Made him feel "crazy"     Labs:  Results for orders placed or performed during the hospital encounter of 07/07/21 (from the past 48 hour(s))  CBC     Status: Abnormal   Collection Time: 07/07/21  3:44 PM  Result Value Ref Range   WBC 3.7 (L) 4.0 - 10.5 K/uL   RBC 4.18 (L) 4.22 - 5.81 MIL/uL   Hemoglobin 12.5 (L) 13.0 - 17.0 g/dL   HCT 39.3 39.0 - 52.0 %   MCV 94.0 80.0 - 100.0 fL   MCH 29.9 26.0 - 34.0 pg   MCHC 31.8 30.0 - 36.0 g/dL   RDW 13.2 11.5 - 15.5 %   Platelets 137 (L) 150 - 400 K/uL   nRBC 0.0 0.0 - 0.2 %    Comment: Performed at [facility], [address], [place], [place] [address]  Comprehensive metabolic panel     Status: Abnormal   Collection Time: 07/07/21  3:44 PM  Result Value Ref Range   Sodium 138 135 - 145 mmol/L   Potassium 4.9 3.5 - 5.1 mmol/L   Chloride 98 98 - 111 mmol/L   CO2 30 22 - 32 mmol/L   Glucose, Bld 106 (H) 70 - 99 mg/dL    Comment: Glucose reference range applies only to samples taken after fasting for at least 8 hours.   BUN 68 (H) 8 - 23 mg/dL   Creatinine, Ser 3.66 (H) 0.61 - 1.24 mg/dL   Calcium 9.5 8.9 - 10.3 mg/dL   Total Protein 7.3 6.5 - 8.1 g/dL   Albumin 3.8 3.5 - 5.0 g/dL   AST 14 (L) 15 - 41 U/L   ALT 7 0 - 44 U/L   Alkaline Phosphatase 77 38 - 126 U/L   Total Bilirubin 0.7 0.3 - 1.2 mg/dL   GFR, Estimated 16 (L) >60 mL/min    Comment: (NOTE) Calculated using the CKD-EPI Creatinine Equation (2021)    Anion gap 10 5 - 15    Comment: Performed at [facility], [place], [place] [address]  Troponin I (High Sensitivity)     Status: None   Collection Time: 07/07/21  3:44 PM  Result Value Ref Range   Troponin I (High Sensitivity) 13 <18 ng/L    Comment: (NOTE) Elevated high sensitivity troponin I (hsTnI) values and significant  changes across serial measurements may suggest ACS but many other  chronic and acute conditions are known to elevate hsTnI results.  Refer to the "Links" section for chest pain algorithms and additional  guidance. Performed at [facility], [address] [place], [place] [address]   Brain natriuretic peptide     Status: None   Collection Time: 07/07/21  3:44 PM  Result Value Ref Range   B Natriuretic Peptide 6.8 0.0 - 100.0 pg/mL    Comment: Performed at [facility], [address] [place], [place] [address]  Urinalysis, Complete w Microscopic     Status: Abnormal   Collection Time: 07/07/21  4:43 PM  Result Value Ref Range   Color, Urine YELLOW (A) YELLOW   APPearance CLEAR (A) CLEAR   Specific Gravity, Urine 1.010  1.005 - 1.030   pH 5.0 5.0 - 8.0   Glucose, UA NEGATIVE NEGATIVE mg/dL   Hgb urine dipstick NEGATIVE NEGATIVE   Bilirubin Urine NEGATIVE  NEGATIVE   Ketones, ur NEGATIVE NEGATIVE mg/dL   Protein, ur NEGATIVE NEGATIVE mg/dL   Nitrite NEGATIVE NEGATIVE   Leukocytes,Ua NEGATIVE NEGATIVE   WBC, UA 0-5 0 - 5 WBC/hpf   Bacteria, UA RARE (A) NONE SEEN   Squamous Epithelial / LPF 0-5 0 - 5   Mucus PRESENT    Sperm, UA PRESENT     Comment: Performed at [facility], [address], [place], [place] [address]  Resp Panel by RT-PCR (Flu A&B, Covid)     Status: None   Collection Time: 07/07/21  4:43 PM   Specimen: Nasopharyngeal(NP) swabs in vial transport medium  Result Value Ref Range   SARS Coronavirus 2 by RT PCR NEGATIVE NEGATIVE    Comment: (NOTE) SARS-CoV-2 target nucleic acids are NOT DETECTED.  The SARS-CoV-2 RNA is generally detectable in upper respiratory specimens during the acute phase of infection. The lowest concentration of SARS-CoV-2 viral copies this assay can detect is 138 copies/mL. A negative result does not preclude SARS-Cov-2 infection and should not be used as the sole basis for treatment or other patient management decisions. A negative result may occur with  improper specimen collection/handling, submission of specimen other than nasopharyngeal swab, presence of viral mutation(s) within the areas targeted by this assay, and inadequate number of viral copies(<138 copies/mL). A negative result must be combined with clinical observations, patient history, and epidemiological information. The expected result is Negative.  Fact Sheet for Patients:  EntrepreneurPulse.com.au  Fact Sheet for Healthcare Providers:  IncredibleEmployment.be  This test is no t yet approved or cleared by the [place] FDA and  has been authorized for detection and/or diagnosis of SARS-CoV-2 by FDA under an Emergency Use Authorization (EUA).  This EUA will remain  in effect (meaning this test can be used) for the duration of the COVID-19 declaration under Section 564(b)(1) of the Act, 21 U.S.C.section 360bbb-3(b)(1), unless the authorization is terminated  or revoked sooner.       Influenza A by PCR NEGATIVE NEGATIVE   Influenza B by PCR NEGATIVE NEGATIVE    Comment: (NOTE) The Xpert Xpress SARS-CoV-2/FLU/RSV plus assay is intended as an aid in the diagnosis of influenza from Nasopharyngeal swab specimens and should not be used as a sole basis for treatment. Nasal washings and aspirates are unacceptable for Xpert Xpress SARS-CoV-2/FLU/RSV testing.  Fact Sheet for Patients: EntrepreneurPulse.com.au  Fact Sheet for Healthcare Providers: IncredibleEmployment.be  This test is not yet approved or cleared by the [place] FDA and has been authorized for detection and/or diagnosis of SARS-CoV-2 by FDA under an Emergency Use Authorization (EUA). This EUA will remain in effect (meaning this test can be used) for the duration of the COVID-19 declaration under Section 564(b)(1) of the Act, 21 U.S.C. section 360bbb-3(b)(1), unless the authorization is terminated or revoked.  Performed at [facility], [place], [place] [address]   Troponin I (High Sensitivity)     Status: Abnormal   Collection Time: 07/07/21  7:07 PM  Result Value Ref Range   Troponin I (High Sensitivity) 20 (H) <18 ng/L    Comment: (NOTE) Elevated high sensitivity troponin I (hsTnI) values and significant  changes across serial measurements may suggest ACS but many other  chronic and acute conditions are known to elevate hsTnI results.  Refer to the "Links" section for chest pain algorithms and additional  guidance. Performed at [facility], [address] [place], [place] [address]   CK     Status:  None   Collection Time: 07/07/21  7:07 PM  Result Value Ref Range   Total CK  91 49 - 397 U/L    Comment: Performed at [facility], [address] [place], [place] [address]  TSH     Status: None   Collection Time: 07/07/21  7:07 PM  Result Value Ref Range   TSH 1.083 0.350 - 4.500 uIU/mL    Comment: Performed by a 3rd Generation assay with a functional sensitivity of <=0.01 uIU/mL. Performed at [facility], [address] [place], [place] [address]   Basic metabolic panel     Status: Abnormal   Collection Time: 07/08/21  5:53 AM  Result Value Ref Range   Sodium 136 135 - 145 mmol/L   Potassium 5.2 (H) 3.5 - 5.1 mmol/L   Chloride 98 98 - 111 mmol/L   CO2 32 22 - 32 mmol/L   Glucose, Bld 88 70 - 99 mg/dL    Comment: Glucose reference range applies only to samples taken after fasting for at least 8 hours.   BUN 65 (H) 8 - 23 mg/dL   Creatinine, Ser 3.22 (H) 0.61 - 1.24 mg/dL   Calcium 9.3 8.9 - 10.3 mg/dL   GFR, Estimated 19 (L) >60 mL/min    Comment: (NOTE) Calculated using the CKD-EPI Creatinine Equation (2021)    Anion gap 6 5 - 15    Comment: Performed at [facility], [address] [place], [place] [address]  CBC     Status: Abnormal   Collection Time: 07/08/21  5:53 AM  Result Value Ref Range   WBC 4.8 4.0 - 10.5 K/uL   RBC 4.15 (L) 4.22 - 5.81 MIL/uL   Hemoglobin 12.3 (L) 13.0 - 17.0 g/dL   HCT 37.3 (L) 39.0 - 52.0 %   MCV 89.9 80.0 - 100.0 fL   MCH 29.6 26.0 - 34.0 pg   MCHC 33.0 30.0 - 36.0 g/dL   RDW 13.0 11.5 - 15.5 %   Platelets 124 (L) 150 - 400 K/uL   nRBC 0.0 0.0 - 0.2 %    Comment: Performed at [facility], [address] [place],  [address]  CBG monitoring, ED     Status: Abnormal   Collection Time: 07/09/21  6:27 AM  Result Value Ref Range   Glucose-Capillary 37 (LL) 70 - 99 mg/dL    Comment: Glucose reference range applies only to samples taken after fasting for at least 8 hours.   Comment 1 Notify RN   CBG monitoring, ED     Status: None   Collection Time: 07/09/21  7:13 AM   Result Value Ref Range   Glucose-Capillary 89 70 - 99 mg/dL    Comment: Glucose reference range applies only to samples taken after fasting for at least 8 hours.  CBG monitoring, ED     Status: Abnormal   Collection Time: 07/09/21 10:48 AM  Result Value Ref Range   Glucose-Capillary 149 (H) 70 - 99 mg/dL    Comment: Glucose reference range applies only to samples taken after fasting for at least 8 hours.  CBG monitoring, ED     Status: None   Collection Time: 07/09/21 11:16 AM  Result Value Ref Range   Glucose-Capillary 76 70 - 99 mg/dL    Comment: Glucose reference range applies only to samples taken after fasting for at least 8 hours.    Current Facility-Administered Medications  Medication Dose Route Frequency Provider Last Rate Last Admin  acetaminophen (TYLENOL) tablet 650 mg  650 mg Oral Q6H PRN Agbata, Tochukwu, MD       Or   acetaminophen (TYLENOL) suppository 650 mg  650 mg Rectal Q6H PRN Agbata, Tochukwu, MD       amLODipine (NORVASC) tablet 10 mg  10 mg Oral Daily Fritzi Mandes, MD   10 mg at 07/08/21 1611   citalopram (CELEXA) tablet 40 mg  40 mg Oral Daily Agbata, Tochukwu, MD   40 mg at 07/08/21 1112   haloperidol lactate (HALDOL) 5 MG/ML injection            heparin injection 5,000 Units  5,000 Units Subcutaneous Q8H Agbata, Tochukwu, MD   5,000 Units at 07/09/21 0715   hydrALAZINE (APRESOLINE) injection 10 mg  10 mg Intravenous Q6H PRN Sharion Settler, NP   10 mg at 07/07/21 1958   ondansetron (ZOFRAN) tablet 4 mg  4 mg Oral Q6H PRN Agbata, Tochukwu, MD       Or   ondansetron (ZOFRAN) injection 4 mg  4 mg Intravenous Q6H PRN Agbata, Tochukwu, MD       sodium chloride 0.9 % bolus 500 mL  500 mL Intravenous Once Fritzi Mandes, MD   Held at 07/09/21 1038   sodium chloride flush (NS) 0.9 % injection 3 mL  3 mL Intravenous Q12H Agbata, Tochukwu, MD   3 mL at 07/09/21 1034   Current Outpatient Medications  Medication Sig Dispense Refill   citalopram (CELEXA) 40 MG tablet  Take 40 mg by mouth daily.     lisinopril-hydrochlorothiazide (ZESTORETIC) 20-25 MG tablet Take 1 tablet by mouth daily.     gabapentin (NEURONTIN) 100 MG capsule Take 100 mg by mouth 3 (three) times daily. (Patient not taking: Reported on 12/22/2020)     lisinopril (ZESTRIL) 20 MG tablet Take 20 mg by mouth daily. (Patient not taking: Reported on 12/22/2020)      Musculoskeletal: Strength & Muscle Tone: decreased Gait & Station:  did not witness Patient leans: N/A  Psychiatric Specialty Exam: Physical Exam Vitals and nursing note reviewed.  Constitutional:      Appearance: Normal appearance.  HENT:     Head: Normocephalic.     Nose: Nose normal.  Pulmonary:     Effort: Pulmonary effort is normal.  Musculoskeletal:     Cervical back: Normal range of motion.  Neurological:     Mental Status: He is alert.  Psychiatric:        Attention and Perception: He is inattentive.        Mood and Affect: Mood is anxious. Affect is blunt.        Speech: Speech normal.        Behavior: Behavior normal. Behavior is cooperative.        Thought Content: Thought content normal.        Cognition and Memory: Cognition is impaired. Memory is impaired.        Judgment: Judgment is impulsive.    Review of Systems  Musculoskeletal:  Positive for falls.  Psychiatric/Behavioral:  Positive for memory loss. The patient is nervous/anxious.   All other systems reviewed and are negative.  Blood pressure (!) 161/88, pulse (!) 53, temperature 98.1 F (36.7 C), temperature source Oral, resp. rate 18, height 5\' 8"  (1.727 m), weight 77 kg, SpO2 95 %.Body mass index is 25.81 kg/m.  General Appearance: Casual  Eye Contact:  Fair  Speech:  Normal Rate  Volume:  Normal  Mood:  Anxious  Affect:  Blunt  Thought Process:  Goal Directed to get home and go to work  Orientation:  alert and oriented to person  Thought Content:  confused  Suicidal Thoughts:  No  Homicidal Thoughts:  No  Memory:  Immediate;    Poor Recent;   Poor Remote;   Poor  Judgement:  Impaired  Insight:  Lacking  Psychomotor Activity:  Decreased  Concentration:  Concentration: Fair and Attention Span: Fair  Recall:  [employer] of Knowledge:  Fair  Language:  Good  Akathisia:  No  Handed:  Right  AIMS (if indicated):     Assets:  Housing Leisure Time Resilience Social Support  ADL's:  Impaired  Cognition:  Impaired,  Moderate  Sleep:        Physical Exam: Physical Exam Vitals and nursing note reviewed.  Constitutional:      Appearance: Normal appearance.  HENT:     Head: Normocephalic.     Nose: Nose normal.  Pulmonary:     Effort: Pulmonary effort is normal.  Musculoskeletal:     Cervical back: Normal range of motion.  Neurological:     Mental Status: He is alert.  Psychiatric:        Attention and Perception: He is inattentive.        Mood and Affect: Mood is anxious. Affect is blunt.        Speech: Speech normal.        Behavior: Behavior normal. Behavior is cooperative.        Thought Content: Thought content normal.        Cognition and Memory: Cognition is impaired. Memory is impaired.        Judgment: Judgment is impulsive.   Review of Systems  Musculoskeletal:  Positive for falls.  Psychiatric/Behavioral:  Positive for memory loss. The patient is nervous/anxious.   All other systems reviewed and are negative. Blood pressure (!) 161/88, pulse (!) 53, temperature 98.1 F (36.7 C), temperature source Oral, resp. rate 18, height 5\' 8"  (1.727 m), weight 77 kg, SpO2 95 %. Body mass index is 25.81 kg/m.  Treatment Plan Summary: Dementia with behavioral disturbances: Started Depakote 125 mg BID Started Zyprexa 2.5 mg BID PRN agitation  Disposition: No evidence of imminent risk to self or others at present.   Patient does not meet criteria for psychiatric inpatient admission. Supportive therapy provided about ongoing stressors.  Waylan Boga, NP 07/09/2021 11:23 AM

## 2021-07-09 NOTE — ED Notes (Signed)
BGL 149

## 2021-07-09 NOTE — Progress Notes (Signed)
Cross Cover Patient with worsening delirium evident by increased confusion, agitation, wandering, refusal of care and combativeness. Required security several times and use of antipsychotic with manual hold to administer.  Patient agitation slightly improved presently.  He has made several attempts since presentation to leave and these attempts have escalated.  Paperwork for IVC have been obtained

## 2021-07-10 LAB — GLUCOSE, CAPILLARY
Glucose-Capillary: 101 mg/dL — ABNORMAL HIGH (ref 70–99)
Glucose-Capillary: 105 mg/dL — ABNORMAL HIGH (ref 70–99)
Glucose-Capillary: 106 mg/dL — ABNORMAL HIGH (ref 70–99)
Glucose-Capillary: 125 mg/dL — ABNORMAL HIGH (ref 70–99)
Glucose-Capillary: 145 mg/dL — ABNORMAL HIGH (ref 70–99)
Glucose-Capillary: 17 mg/dL — CL (ref 70–99)
Glucose-Capillary: 36 mg/dL — CL (ref 70–99)
Glucose-Capillary: 58 mg/dL — ABNORMAL LOW (ref 70–99)
Glucose-Capillary: 62 mg/dL — ABNORMAL LOW (ref 70–99)
Glucose-Capillary: 70 mg/dL (ref 70–99)

## 2021-07-10 LAB — BASIC METABOLIC PANEL
Anion gap: 7 (ref 5–15)
BUN: 46 mg/dL — ABNORMAL HIGH (ref 8–23)
CO2: 31 mmol/L (ref 22–32)
Calcium: 8.8 mg/dL — ABNORMAL LOW (ref 8.9–10.3)
Chloride: 95 mmol/L — ABNORMAL LOW (ref 98–111)
Creatinine, Ser: 2.34 mg/dL — ABNORMAL HIGH (ref 0.61–1.24)
GFR, Estimated: 28 mL/min — ABNORMAL LOW (ref >60)
Glucose, Bld: 172 mg/dL — ABNORMAL HIGH (ref 70–99)
Potassium: 3.6 mmol/L (ref 3.5–5.1)
Sodium: 133 mmol/L — ABNORMAL LOW (ref 135–145)

## 2021-07-10 MED ORDER — HALOPERIDOL LACTATE 5 MG/ML IJ SOLN
5.0000 mg | Freq: Once | INTRAMUSCULAR | Status: AC
  Administered 2021-07-10: 5 mg via INTRAVENOUS
  Filled 2021-07-10: qty 1

## 2021-07-10 MED ORDER — DEXTROSE 50 % IV SOLN
INTRAVENOUS | Status: AC
  Filled 2021-07-10: qty 50

## 2021-07-10 MED ORDER — HALOPERIDOL LACTATE 5 MG/ML IJ SOLN
INTRAMUSCULAR | Status: AC
  Administered 2021-07-10: 2 mg via INTRAVENOUS
  Filled 2021-07-10: qty 1

## 2021-07-10 MED ORDER — DEXTROSE 50 % IV SOLN
50.0000 mL | INTRAVENOUS | Status: AC
  Administered 2021-07-10: 50 mL via INTRAVENOUS
  Filled 2021-07-10: qty 50

## 2021-07-10 MED ORDER — DEXTROSE 50 % IV SOLN: 50.0000 mL | INTRAVENOUS | Status: DC

## 2021-07-10 MED ORDER — HALOPERIDOL LACTATE 5 MG/ML IJ SOLN: 2.0000 mg | Freq: Once | INTRAMUSCULAR | Status: AC

## 2021-07-10 NOTE — Progress Notes (Signed)
[facility] [place] NAME: Aurthur Wingerter    MR#:  [id number]  DATE OF BIRTH:  Feb 25, 1946  SUBJECTIVE:  patient seen earlier was quite sleepy. Sugar check reveals 36. Patient received one amp of D50 after IV was obtained. Was very agitated and restless was. Sitter at bedside. Received dose of Haldol and Geodon. Patient has been IVCed  has advanced dementia at baseline. No family at bedside Sitter +  today patient was swinging telephone to the nurse. Almost tried to hit her. Pulled sixth IV out. Not taking PO meds. Has not eaten today.  REVIEW OF SYSTEMS:   Review of Systems  Unable to perform ROS: Dementia  Tolerating Diet: Tolerating PT:   DRUG ALLERGIES:   Allergies  Allergen Reactions   Metformin Shortness Of Breath    Made him feel "crazy"    Glucophage [Metformin Hcl]     Made him feel "crazy"     VITALS:  Blood pressure (!) 155/75, pulse (!) 57, temperature (!) 97.3 F (36.3 C), temperature source Oral, resp. rate 18, height 5\' 8"  (1.727 m), weight 70.9 kg, SpO2 94 %.  PHYSICAL EXAMINATION:   Physical Exam  GENERAL:  76 y.o.-year-old patient lying in the bed with no acute distress. Poorly nourished LUNGS: Normal breath sounds bilaterally, no wheezing, rales, rhonchi.  CARDIOVASCULAR: S1, S2 normal. No murmurs, rubs, or gallops.  ABDOMEN: Soft, nontender, nondistended. Bowel sounds present.  EXTREMITIES: bilateral chronic leg swelling with severely dry skin and poor hygiene NEUROLOGIC: nonfocal PSYCHIATRIC:  patient is lethargic SKIN: No obvious rash, lesion, or ulcer.   LABORATORY PANEL:  CBC Recent Labs  Lab 07/08/21 0553  WBC 4.8  HGB 12.3*  HCT 37.3*  PLT 124*     Chemistries  Recent Labs  Lab 07/07/21 1544 07/08/21 0553 07/10/21 0146  NA 138   < > 133*  K 4.9   < > 3.6  CL 98   < > 95*  CO2 30   < > 31  GLUCOSE 106*   < > 172*  BUN 68*   < > 46*  CREATININE 3.66*   < > 2.34*  CALCIUM 9.5   < >  8.8*  AST 14*  --   --   ALT 7  --   --   ALKPHOS 77  --   --   BILITOT 0.7  --   --    < > = values in this interval not displayed.    Cardiac Enzymes No results for input(s): TROPONINI in the last 168 hours. RADIOLOGY:  No results found. ASSESSMENT AND PLAN:   Theo Krumholz is a 76 y.o. male with medical history significant for hypertension, dyslipidemia, first degree AV block, chronic diastolic CHF who was brought into the ER for evaluation of what his son describes as " patient falling out and laying on the ground"  patient was found to have elevated creatinine of 3.6. Baseline is 1.4.     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 Unable to give IV fluids. Encouraging oral fluid intake. -- renal ultrasound  no  obstructive uropathy --Consult nephrology if no improvement in renal function in 24 hours --1/15-- will try to give IV normal saline bolus today. Patient gets agitated  and does not stay in place and hands not able to give continuous IVF --1/16-- patient is pulled his sixth IV out. He is not eating today. Has not taken meds. Was very agitated swinging at the nurse. Received Haldol and Zyprexa. Dr. Weber Cooks to see patient. -- palliative care to see patient. Poor prognosis.  Acute metabolic encephalopathy with severe agitation, acute delirium Dementia -- patient was placed under IVC last night. -- Sitter at bedside -- psychiatry consultation placed. Started on Zyprexa and Depakote --Continue citalopram  Syncope and collapse suspect due to severe dehydration Likely related to volume depletion/hypotension from diuretic use  --consult with Dr Corky Sox cardiology-- recommends Monitor arrhythmia and will need monitor at  discharge--he may not keep it on due to his severe dementia  Hypoglycemia -- patient sugar dropped down to 36. RN was able to get IV D50. Patient thereafter drank juice and eight banana. Sugar at 76 -- continue sugar check every two hours for now. -- Sugars stable so far. Patient has lost IV. 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  >50% time spent on counselling and coordination of care  Note: This dictation was prepared with Dragon dictation along with smaller phrase technology. Any transcriptional errors that result from this process are unintentional.  Fritzi Mandes M.D    [facility]   CC: Primary care physician; [name], MD Patient ID: Rodrickus Min, male   DOB: 01-16-1946, 76 y.o.   MRN: [id number]

## 2021-07-10 NOTE — Progress Notes (Signed)
PT Cancellation Note  Patient Details Name: Dillon Schultz MRN: [id number] DOB: 03-10-1946   Cancelled Treatment:    Reason Eval/Treat Not Completed: Other (comment).  PT consult received.  Chart reviewed.  Pt sleeping in bed upon PT arrival; pt's sitter reporting pt received Haldol this morning (0645 via IV per chart).  Able to briefly wake pt but pt rolled over in bed and pulled blankets over his head.  Will re-attempt PT evaluation at a later date/time.  Leitha Bleak, PT 07/10/21, 10:12 AM

## 2021-07-10 NOTE — Consult Note (Signed)
Dillon Schultz Psychiatry Consult   Reason for Consult: Consult for 76 year old man with dementia and agitation Referring Physician: Posey Pronto Patient Identification: Dillon Schultz MRN:  [id number] Principal Diagnosis: Dementia with behavioral disturbance Diagnosis:  Principal Problem:   Dementia with behavioral disturbance Active Problems:   Hypertension   Syncope and collapse   Acute kidney injury ([place])   Total Time spent with patient: 30 minutes  Subjective:   Dillon Schultz is a 76 y.o. male patient admitted with patient was not awake and did not seem like it would benefit to wake him up.  This is a 76 year old man with a history of advanced dementia.  Brought into the hospital for syncope and falling episodes at home.  In the hospital has been repeatedly agitated pulling out IV lines difficult to treat despite dehydration.Marland Kitchen  HPI: See note above.  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 No pertinent family history. Family Psychiatric   History: None known Social History:  Social History   Substance and Sexual Activity  Alcohol Use None     Social History   Substance and Sexual Activity  Drug Use Not on file    Social History   Socioeconomic History   Marital status: Married    Spouse name: Not on file   Number of children: Not on file   Years of education: Not on file   Highest education level: Not on file  Occupational History   Not on file  Tobacco Use   Smoking status: Former    Years: 5.00    Types: Cigarettes    Quit date: 06/26/1967    Years since quitting: 54.0   Smokeless tobacco: Never  Substance and Sexual Activity   Alcohol use: Not on file   Drug use: Not on file   Sexual activity: Not on file  Other Topics Concern   Not on file  Social History Narrative   Not on file   Social Determinants of Health   Financial Resource Strain: Not on file  Food Insecurity: Not on file  Transportation Needs: Not on file  Physical Activity: Not on file  Stress: Not on file  Social Connections: Not on file   Additional Social History:    Allergies:   Allergies  Allergen Reactions   Metformin Shortness Of Breath    Made him feel "crazy"    Glucophage [Metformin Hcl]     Made him feel "crazy"  Labs:  Results for orders placed or performed during the hospital encounter of 07/07/21 (from the past 48 hour(s))  CBG monitoring, ED     Status: Abnormal   Collection Time: 07/09/21  6:27 AM  Result Value Ref Range   Glucose-Capillary 37 (LL) 70 - 99 mg/dL    Comment: Glucose reference range applies only to samples taken after fasting for at least 8 hours.   Comment 1 Notify RN   CBG monitoring, ED     Status: None   Collection Time: 07/09/21  7:13 AM  Result Value Ref Range   Glucose-Capillary 89 70 - 99 mg/dL    Comment: Glucose reference range applies only to samples taken after fasting for at least 8 hours.  CBG monitoring, ED     Status: Abnormal   Collection Time: 07/09/21 10:48 AM   Result Value Ref Range   Glucose-Capillary 149 (H) 70 - 99 mg/dL    Comment: Glucose reference range applies only to samples taken after fasting for at least 8 hours.  CBG monitoring, ED     Status: None   Collection Time: 07/09/21 11:16 AM  Result Value Ref Range   Glucose-Capillary 76 70 - 99 mg/dL    Comment: Glucose reference range applies only to samples taken after fasting for at least 8 hours.  CBG monitoring, ED     Status: Abnormal   Collection Time: 07/09/21  1:07 PM  Result Value Ref Range   Glucose-Capillary 106 (H) 70 - 99 mg/dL    Comment: Glucose reference range applies only to samples taken after fasting for at least 8 hours.  CBG monitoring, ED     Status: Abnormal   Collection Time: 07/09/21  3:12 PM  Result Value Ref Range   Glucose-Capillary 44 (LL) 70 - 99 mg/dL    Comment: Glucose reference range applies only to samples taken after fasting for at least 8 hours.  CBG monitoring, ED     Status: Abnormal   Collection Time: 07/09/21  3:35 PM  Result Value Ref Range   Glucose-Capillary 59 (L) 70 - 99 mg/dL    Comment: Glucose reference range applies only to samples taken after fasting for at least 8 hours.  CBG monitoring, ED     Status: Abnormal   Collection Time: 07/09/21  4:07 PM  Result Value Ref Range   Glucose-Capillary 100 (H) 70 - 99 mg/dL    Comment: Glucose reference range applies only to samples taken after fasting for at least 8 hours.  Glucose, capillary     Status: Abnormal   Collection Time: 07/09/21  8:43 PM  Result Value Ref Range   Glucose-Capillary 40 (LL) 70 - 99 mg/dL    Comment: Glucose reference range applies only to samples taken after fasting for at least 8 hours.   Comment 1 Notify RN   Glucose, capillary     Status: Abnormal   Collection Time: 07/09/21 10:16 PM  Result Value Ref Range   Glucose-Capillary 64 (L) 70 - 99 mg/dL    Comment: Glucose reference range applies only to samples taken after fasting for at least 8 hours.   Glucose, capillary     Status: Abnormal   Collection Time: 07/10/21 12:03 AM  Result Value Ref Range   Glucose-Capillary 17 (LL) 70 - 99 mg/dL    Comment: Glucose reference range applies only to samples taken after fasting for at least 8 hours.   Comment 1 Notify RN   Glucose, capillary  Status: Abnormal   Collection Time: 07/10/21 12:20 AM  Result Value Ref Range   Glucose-Capillary 36 (LL) 70 - 99 mg/dL    Comment: Glucose reference range applies only to samples taken after fasting for at least 8 hours.   Comment 1 Notify RN    Comment 2 Document in Chart   Glucose, capillary     Status: Abnormal   Collection Time: 07/10/21  1:12 AM  Result Value Ref Range   Glucose-Capillary 145 (H) 70 - 99 mg/dL    Comment: Glucose reference range applies only to samples taken after fasting for at least 8 hours.  Basic metabolic panel     Status: Abnormal   Collection Time: 07/10/21  1:46 AM  Result Value Ref Range   Sodium 133 (L) 135 - 145 mmol/L   Potassium 3.6 3.5 - 5.1 mmol/L   Chloride 95 (L) 98 - 111 mmol/L   CO2 31 22 - 32 mmol/L   Glucose, Bld 172 (H) 70 - 99 mg/dL    Comment: Glucose reference range applies only to samples taken after fasting for at least 8 hours.   BUN 46 (H) 8 - 23 mg/dL   Creatinine, Ser 2.34 (H) 0.61 - 1.24 mg/dL   Calcium 8.8 (L) 8.9 - 10.3 mg/dL   GFR, Estimated 28 (L) >60 mL/min    Comment: (NOTE) Calculated using the CKD-EPI Creatinine Equation (2021)    Anion gap 7 5 - 15    Comment: Performed at [facility], [address] [place], [place] [address]  Glucose, capillary     Status: Abnormal   Collection Time: 07/10/21  4:06 AM  Result Value Ref Range   Glucose-Capillary 125 (H) 70 - 99 mg/dL    Comment: Glucose reference range applies only to samples taken after fasting for at least 8 hours.  Glucose, capillary     Status: Abnormal   Collection Time: 07/10/21  9:51 AM  Result Value Ref Range   Glucose-Capillary 105 (H) 70 - 99 mg/dL     Comment: Glucose reference range applies only to samples taken after fasting for at least 8 hours.  Glucose, capillary     Status: Abnormal   Collection Time: 07/10/21  2:01 PM  Result Value Ref Range   Glucose-Capillary 101 (H) 70 - 99 mg/dL    Comment: Glucose reference range applies only to samples taken after fasting for at least 8 hours.    Current Facility-Administered Medications  Medication Dose Route Frequency Provider Last Rate Last Admin   acetaminophen (TYLENOL) tablet 650 mg  650 mg Oral Q6H PRN Agbata, Tochukwu, MD   650 mg at 07/09/21 2325   Or   acetaminophen (TYLENOL) suppository 650 mg  650 mg Rectal Q6H PRN Agbata, Tochukwu, MD       amLODipine (NORVASC) tablet 10 mg  10 mg Oral Daily Fritzi Mandes, MD   10 mg at 07/10/21 1557   citalopram (CELEXA) tablet 40 mg  40 mg Oral Daily Agbata, Tochukwu, MD   40 mg at 07/10/21 1557   dextrose 10 % infusion   Intravenous Continuous Foust, Katy L, NP 100 mL/hr at 07/10/21 0623 New Bag at 07/10/21 0623   divalproex (DEPAKOTE) DR tablet 125 mg  125 mg Oral Q12H Patrecia Pour, NP   125 mg at 07/10/21 1601   heparin injection 5,000 Units  5,000 Units Subcutaneous Q8H Agbata, Tochukwu, MD   5,000 Units at 07/09/21 2124   hydrALAZINE (APRESOLINE) injection 10 mg  10 mg Intravenous Q6H PRN Sharion Settler, NP   10 mg at 07/07/21 1958   hydrALAZINE (APRESOLINE) tablet 25 mg  25 mg Oral BID Fritzi Mandes, MD   25 mg at 07/10/21 1557   OLANZapine (ZYPREXA) tablet 2.5 mg  2.5 mg Oral BID PRN Patrecia Pour, NP   2.5 mg at 07/09/21 2325   Or   OLANZapine (ZYPREXA) injection 2.5 mg  2.5 mg Intramuscular BID PRN Patrecia Pour, NP   2.5 mg at 07/10/21 1142   ondansetron (ZOFRAN) tablet 4 mg  4 mg Oral Q6H PRN Agbata, Tochukwu, MD       Or   ondansetron (ZOFRAN) injection 4 mg  4 mg Intravenous Q6H PRN Agbata, Tochukwu, MD       sodium chloride flush (NS) 0.9 % injection 3 mL  3 mL Intravenous Q12H Agbata, Tochukwu, MD   3 mL at 07/10/21  1149    Musculoskeletal: Strength & Muscle Tone: within normal limits Gait & Station: normal Patient leans: N/A            Psychiatric Specialty Exam:  Presentation  General Appearance: No data recorded Eye Contact:No data recorded Speech:No data recorded Speech Volume:No data recorded Handedness:No data recorded  Mood and Affect  Mood:No data recorded Affect:No data recorded  Thought Process  Thought Processes:No data recorded Descriptions of Associations:No data recorded Orientation:No data recorded Thought Content:No data recorded History of Schizophrenia/Schizoaffective disorder:No  Duration of Psychotic Symptoms:No data recorded Hallucinations:No data recorded Ideas of Reference:No data recorded Suicidal Thoughts:No data recorded Homicidal Thoughts:No data recorded  Sensorium  Memory:No data recorded Judgment:No data recorded Insight:No data recorded  Executive Functions  Concentration:No data recorded Attention Span:No data recorded Recall:No data recorded Fund of Knowledge:No data recorded Language:No data recorded  Psychomotor Activity  Psychomotor Activity:No data recorded  Assets  Assets:No data recorded  Sleep  Sleep:No data recorded  Physical Exam: Physical Exam Constitutional:      Appearance: Normal appearance.  HENT:     Head: Normocephalic and atraumatic.     Mouth/Throat:     Pharynx: Oropharynx is clear.  Eyes:     Pupils: Pupils are equal, round, and reactive to light.  Cardiovascular:     Rate and Rhythm: Normal rate and regular rhythm.  Pulmonary:     Effort: Pulmonary effort is normal.     Breath sounds: Normal breath sounds.  Abdominal:     General: Abdomen is flat.     Palpations: Abdomen is soft.  Musculoskeletal:        General: Normal range of motion.  Skin:    General: Skin is warm and dry.  Psychiatric:        Speech: He is noncommunicative.   Review of Systems  Unable to perform ROS: Dementia   Blood pressure (!) 155/75, pulse (!) 57, temperature 97.9 F (36.6 C), temperature source Oral, resp. rate 18, height 5\' 8"  (1.727 m), weight 70.9 kg, SpO2 94 %. Body mass index is 23.77 kg/m.  Treatment Plan Summary: Plan chart reviewed.  It looks like as needed olanzapine 2.5 mg has already been ordered.  This is probably what I would have suggested to start with.  Do not be afraid to give the patient medication when he starts to get agitated rather than waiting until he is already out of control.  If the as needed is not adequate to allow for safe treatment and I will reassess and we can put him on standing doses of medicine  Disposition: Patient does not meet criteria for psychiatric inpatient admission. Supportive therapy provided about ongoing stressors.  Alethia Berthold, MD 07/10/2021 5:10 PM

## 2021-07-10 NOTE — Consult Note (Signed)
Consultation Note Date: 07/10/2021   Patient Name: Dillon Schultz  DOB: February 21, 1946  MRN: [id number]  Age / Sex: 76 y.o., male  PCP: [name], MD Referring Physician: Fritzi Mandes, MD  Reason for Consultation: Establishing goals of care  HPI/Patient Profile: Dillon Schultz is a 76 y.o. male with medical history significant for hypertension, dyslipidemia, first degree AV block, chronic diastolic CHF who was brought into the ER for evaluation of what his son describes as " patient falling out and laying on the ground'. He was unable to tell me if these episodes are associated with LOC. He states that patient has not had any jerking movements involving his extremities, no urinary or fecal incontinence.   Clinical Assessment and Goals of Care: Patient with covers pulled over head and sitter at bedside. Spoke with son on the phone. He confirms he is HPOA.   Son states patient lives at home with his wife, and son tells me he lives with them. He states at baseline his father is forgetful. He loves to ride around. He states his father is able to complete all ADL's independently. He remains fully continent of bowel and bladder.   He is able to correctly articulate his father's status and diagnoses. He states he is aware of his father's agitation. He is aware he has removed IVs and is not eating or drinking. We discussed his dehydration on admission. Son states this was due to his diuretic dosing, which was too strong, and will now be decreased. He states his father eats and drinks well at home. He states he is considering taking his father home as he feels he will do better in his home surroundings. He is deciding if he will come to bedside today to see patient as he does not want the patient to become agitated when he leaves without him. Plans for a meeting tomorrow at 1:00  SUMMARY OF RECOMMENDATIONS   Will meet son and  family at 1:00 tomorrow.       Primary Diagnoses: Present on Admission:  Syncope and collapse  Dementia with behavioral disturbance  Hypertension   I have reviewed the medical record, interviewed the patient and family, and examined the patient. The following aspects are pertinent.  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   Social History   Socioeconomic History   Marital status: Married    Spouse name: Not on file   Number of children: Not on file   Years of education: Not on file   Highest education level: Not on file  Occupational History   Not on file  Tobacco Use   Smoking status: Former    Years: 5.00    Types: Cigarettes  Quit date: 06/26/1967    Years since quitting: 54.0   Smokeless tobacco: Never  Substance and Sexual Activity   Alcohol use: Not on file   Drug use: Not on file   Sexual activity: Not on file  Other Topics Concern   Not on file  Social History Narrative   Not on file   Social Determinants of Health   Financial Resource Strain: Not on file  Food Insecurity: Not on file  Transportation Needs: Not on file  Physical Activity: Not on file  Stress: Not on file  Social Connections: Not on file   History reviewed. No pertinent family history. Scheduled Meds:  amLODipine  10 mg Oral Daily   citalopram  40 mg Oral Daily   divalproex  125 mg Oral Q12H   heparin  5,000 Units Subcutaneous Q8H   hydrALAZINE  25 mg Oral BID   sodium chloride flush  3 mL Intravenous Q12H   Continuous Infusions:  dextrose 100 mL/hr at 07/10/21 0623   PRN Meds:.acetaminophen **OR** acetaminophen, hydrALAZINE, OLANZapine **OR** OLANZapine, ondansetron **OR** ondansetron (ZOFRAN) IV Medications Prior to Admission:  Prior to Admission  medications   Medication Sig Start Date End Date Taking? Authorizing Provider  citalopram (CELEXA) 40 MG tablet Take 40 mg by mouth daily. 03/27/21  Yes [provider]  lisinopril-hydrochlorothiazide (ZESTORETIC) 20-25 MG tablet Take 1 tablet by mouth daily. 03/27/21  Yes [provider]  gabapentin (NEURONTIN) 100 MG capsule Take 100 mg by mouth 3 (three) times daily. Patient not taking: Reported on 12/22/2020 12/01/20   [provider]  lisinopril (ZESTRIL) 20 MG tablet Take 20 mg by mouth daily. Patient not taking: Reported on 12/22/2020 12/02/20   [provider]  amLODipine (NORVASC) 10 MG tablet Take 10 mg by mouth daily. Reported on 12/08/2015  12/22/20  [provider]  azelastine (ASTELIN) 137 MCG/SPRAY nasal spray Place 2 sprays into the nose 2 (two) times daily. Reported on 12/08/2015  12/22/20  [provider]  cetirizine (ZYRTEC) 10 MG tablet Take 10 mg by mouth daily. Reported on 12/08/2015  12/22/20  [provider]  furosemide (LASIX) 20 MG tablet Take 20 mg by mouth daily.  12/22/20  [provider]   Allergies  Allergen Reactions   Metformin Shortness Of Breath    Made him feel "crazy"    Glucophage [Metformin Hcl]     Made him feel "crazy"    Review of Systems  Unable to perform ROS  Physical Exam Constitutional:      Comments: Covers pulled over head.     Vital Signs: BP (!) 155/75 (BP Location: Left Arm)    Pulse (!) 57    Temp (!) 97.3 F (36.3 C) (Oral)    Resp 18    Ht 5\' 8"  (1.727 m)    Wt 70.9 kg    SpO2 94%    BMI 23.77 kg/m  Pain Scale: 0-10   Pain Score: 0-No pain   SpO2: SpO2: 94 % O2 Device:SpO2: 94 % O2 Flow Rate: .   IO: Intake/output summary:  Intake/Output Summary (Last 24 hours) at 07/10/2021 1551 Last data filed at 07/09/2021 2056 Gross per 24 hour  Intake --  Output 700 ml  Net -700 ml    LBM: Last BM Date:  (PTA) Baseline Weight: Weight: 77 kg Most recent weight: Weight:  70.9 kg       Time In: 3:25 Time Out: 4:05 Time Total: 40 min Greater than 50%  of this  time was spent counseling and coordinating care related to the above assessment and plan.  Signed by: Asencion Gowda, NP   Please contact Palliative Medicine Team phone at [phone] for questions and concerns.  For individual provider: See Shea Evans

## 2021-07-10 NOTE — Progress Notes (Incomplete)
Went in to patient room sitter at bed side pt on phone with wife, patient suddenly

## 2021-07-10 NOTE — Plan of Care (Signed)
PMT note:  Consult request noted for [place]. Patient is resting in bed with covers pulled over head. Sitter is at bedside. Sitter states he has not eaten today. No family at bedside. Attempted to call wife at number listed unsuccessfully. Will reatttempt tomorrow.

## 2021-07-11 DIAGNOSIS — Z7189 Other specified counseling: Secondary | ICD-10-CM

## 2021-07-11 LAB — GLUCOSE, CAPILLARY
Glucose-Capillary: 36 mg/dL — CL (ref 70–99)
Glucose-Capillary: 73 mg/dL (ref 70–99)
Glucose-Capillary: 108 mg/dL — ABNORMAL HIGH (ref 70–99)
Glucose-Capillary: 150 mg/dL — ABNORMAL HIGH (ref 70–99)
Glucose-Capillary: 118 mg/dL — ABNORMAL HIGH (ref 70–99)

## 2021-07-11 MED ORDER — AMLODIPINE BESYLATE 10 MG PO TABS: 10.0000 mg | ORAL_TABLET | Freq: Every day | ORAL | 0 refills | Status: DC

## 2021-07-11 MED ORDER — DIVALPROEX SODIUM 125 MG PO DR TAB: 125.0000 mg | DELAYED_RELEASE_TABLET | Freq: Two times a day (BID) | ORAL | 0 refills | Status: AC

## 2021-07-11 MED ORDER — ASPIRIN EC 81 MG PO TBEC: 81.0000 mg | DELAYED_RELEASE_TABLET | Freq: Every day | ORAL | 2 refills | Status: DC

## 2021-07-11 MED ORDER — HYDRALAZINE HCL 25 MG PO TABS: 25.0000 mg | ORAL_TABLET | Freq: Two times a day (BID) | ORAL | 0 refills | Status: DC

## 2021-07-11 NOTE — Progress Notes (Signed)
[facility]) Hospital Liaison Note  Notified by Pricilla Riffle, LCSW [facility] manager of patient/family request for [facility] Palliative services at home after discharge.  [facility] hospital liaison will follow patient for discharge disposition.  Please call with any hospice or outpatient palliative care related questions.  Thank you for the opportunity to participate in this patient's care.  Nadene Rubins, RN, BSN [place] [phone]

## 2021-07-11 NOTE — Progress Notes (Signed)
Son given d/c packet, reviewed meds and all that pt had refused. Son has no more questions.

## 2021-07-11 NOTE — Progress Notes (Signed)
PT Cancellation Note  Patient Details Name: Dillon Schultz MRN: [id number] DOB: 09/12/1945   Cancelled Treatment:    Reason Eval/Treat Not Completed: Other (comment).  Chart reviewed.  Pt noted to be standing in room with nursing present as well as multiple security staff members (per staff pt has had recent agitation issues).  Discussed pt with MD Posey Pronto and MD cleared PT to sign off at this time (d/t pt still agitated and not appropriate for PT participation).  Please re-consult PT if pt's status changes/behavior improves (to be able to participate in therapy) and acute PT needs are indicated.  Leitha Bleak, PT 07/11/21, 9:45 AM

## 2021-07-11 NOTE — Progress Notes (Addendum)
°  Transition of Care [facility]) Screening Note   Patient Details  Name: Dillon Schultz Date of Birth: [birth year]   Transition of Care [facility]) CM/SW Contact:    Alberteen Sam, LCSW Phone Number: 07/11/2021, 2:52 PM  Patient to discharge home today, [name] with [facility] has been informed as patient will continue with outpatient palliative. No further discharge needs identified.   Transition of Care Department [facility]) has reviewed patient and no TOC needs have been identified at this time. We will continue to monitor patient advancement through interdisciplinary progression rounds. If new patient transition needs arise, please place a TOC consult.  Marland Kitchenam

## 2021-07-11 NOTE — Progress Notes (Addendum)
° °      CROSS COVER NOTE  NAME: Dillon Schultz MRN: [id number] DOB : 02-Sep-1945   Multiple reports of patient agitation and aggression received from nursing overnight.  Nursing reports that patient is banging his heart rate monitor on the wall in the bathroom, kicking and punching the air, trying to jump out of the bed, and that he kicked his sitter. I presented to bedside on 3 separate occasions and was unable to witness this behavior. I witnessed patient being uncooperative however he was not agitated or aggressive while I was at bedside.  Patient is across the hall from another confused patient that he believes to be his wife, he responds and tries to get to that patient when they yell out. Continue PRN Zyprexa as ordered and Air cabin crew.   Neomia Glass MHA, MSN, FNP-BC Nurse Practitioner [facility] Pager [phone]

## 2021-07-11 NOTE — Progress Notes (Signed)
[facility] [place] NAME: Dillon Schultz    MR#:  [id number]  DATE OF BIRTH:  [birth year]  SUBJECTIVE:   has advanced dementia at baseline. No family at bedside Sitter + patient is sitting at the edge of the bed. Security was called earlier given his agitation. Awaiting for his son to come up.  REVIEW OF SYSTEMS:   Review of Systems  Unable to perform ROS: Dementia  Tolerating Diet: Tolerating PT:   DRUG ALLERGIES:   Allergies  Allergen Reactions   Metformin Shortness Of Breath    Made him feel "crazy"    Glucophage [Metformin Hcl]     Made him feel "crazy"     VITALS:  Blood pressure 136/74, pulse (!) 53, temperature 98.2 F (36.8 C), temperature source Axillary, resp. rate 17, height 5\' 8"  (1.727 m), weight 70.9 kg, SpO2 100 %.  PHYSICAL EXAMINATION:   Physical Exam  GENERAL:  76 y.o.-year-old patient lying in the bed with no acute distress. Poorly nourished, poor hygiene LUNGS: Normal breath sounds bilaterally, no wheezing, rales, rhonchi.  CARDIOVASCULAR: S1, S2 normal. No murmurs, rubs, or gallops.  ABDOMEN: Soft, nontender, nondistended. Bowel sounds present.  EXTREMITIES: bilateral chronic leg swelling with severely dry skin and poor hygiene NEUROLOGIC: nonfocal PSYCHIATRIC:  patient is alert, confused   LABORATORY PANEL:  CBC Recent Labs  Lab 07/08/21 0553  WBC 4.8  HGB 12.3*  HCT 37.3*  PLT 124*     Chemistries  Recent Labs  Lab 07/07/21 1544 07/08/21 0553 07/10/21 0146  NA 138   < > 133*  K 4.9   < > 3.6  CL 98   < > 95*  CO2 30   < > 31  GLUCOSE 106*   < > 172*  BUN 68*   < > 46*  CREATININE 3.66*   < > 2.34*  CALCIUM 9.5   < > 8.8*  AST 14*  --   --   ALT 7  --   --   ALKPHOS 77  --   --   BILITOT 0.7  --   --    < > = values in this interval not displayed.    Cardiac Enzymes No results for input(s): TROPONINI in the last 168 hours. RADIOLOGY:  No results found. ASSESSMENT AND PLAN:    Dillon Schultz is a 76 y.o. male with medical history significant for hypertension, dyslipidemia, first degree AV block, chronic diastolic CHF who was brought into the ER for evaluation of what his son describes as " patient falling out and laying on the ground"  patient was found to have elevated creatinine of 3.6. Baseline is 1.4.     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 Unable to give IV fluids. Encouraging oral fluid intake. -- renal ultrasound  no  obstructive uropathy --Consult nephrology if no improvement in renal function in 24 hours --1/15-- will try to give IV normal saline bolus today. Patient gets agitated and does not stay in place and hands not able to give continuous IVF --1/16-- patient is pulled his sixth IV out. He is not eating today. Has not taken meds. Was very agitated swinging at the nurse. Received Haldol and Zyprexa. Dr. Weber Cooks to see patient. --  palliative care to see patient. Poor prognosis. --1/17-- awaiting for son to come and discuss goals of care. Son discussed with palliative care on the phone. He understands patient care is a poor prognosis.   Acute metabolic encephalopathy with severe agitation, acute delirium Dementia -- patient was placed under IVC last night. -- Sitter at bedside -- psychiatry consultation placed. Started on Zyprexa and Depakote --Continue citalopram  Syncope and collapse suspect due to severe dehydration Likely related to volume depletion/hypotension from diuretic use  --consult with Dr Corky Sox cardiology-- recommends Monitor arrhythmia and will need monitor at discharge--he may not keep it on due to his severe dementia  Hypoglycemia -- patient sugar dropped down to 36. RN was  able to get IV D50. Patient thereafter drank juice and eight banana. Sugar at 76 -- continue sugar check every two hours for now. -- Sugars stable so far. Patient has lost IV. 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  >50% time spent on counselling and coordination of care  Note: This dictation was prepared with Dragon dictation along with smaller phrase technology. Any transcriptional errors that result from this process are unintentional.  Fritzi Mandes M.D    [facility]   CC: Primary care physician; [name], MD Patient ID: Dillon Schultz, male   DOB: 10-20-1945, 76 y.o.   MRN: [id number]

## 2021-07-11 NOTE — Progress Notes (Addendum)
Daily Progress Note   Patient Name: Dillon Schultz       Date: 07/11/2021 DOB: [birth year]  Age: 76 y.o. MRN#: [id number] Attending Physician: Fritzi Mandes, MD Primary Care Physician: [name], MD Admit Date: 07/07/2021  Reason for Consultation/Follow-up: Establishing goals of care  Subjective: In to scheduled 1:00pm meeting with son. Security and sitter are at bedside. No family present. At 1:30 I was advised son will come at a later time. Spoke with attending who will meet with son. Discussed poor prognosis and recommendation for outpatient palliative to follow closely with transition to hospice care when family is ready.   Length of Stay: 4  Current Medications: Scheduled Meds:   amLODipine  10 mg Oral Daily   citalopram  40 mg Oral Daily   dextrose  50 mL Intravenous STAT   divalproex  125 mg Oral Q12H   heparin  5,000 Units Subcutaneous Q8H   hydrALAZINE  25 mg Oral BID   sodium chloride flush  3 mL Intravenous Q12H    Continuous Infusions:  dextrose 100 mL/hr at 07/11/21 0559    PRN Meds: acetaminophen **OR** acetaminophen, hydrALAZINE, OLANZapine **OR** OLANZapine, ondansetron **OR** ondansetron (ZOFRAN) IV  Physical Exam Pulmonary:     Effort: Pulmonary effort is normal.  Neurological:     Mental Status: He is alert.            Vital Signs: BP 136/74    Pulse (!) 53    Temp 98.2 F (36.8 C) (Axillary)    Resp 17    Ht 5\' 8"  (1.727 m)    Wt 70.9 kg    SpO2 100%    BMI 23.77 kg/m  SpO2: SpO2: 100 % O2 Device: O2 Device: Room Air O2 Flow Rate:    Intake/output summary: No intake or output data in the 24 hours ending 07/11/21 1328 LBM: Last BM Date:  (PTA) Baseline Weight: Weight: 77 kg Most recent weight: Weight: 70.9 kg     Patient Active Problem List    Diagnosis Date Noted   Delirium    Syncope and collapse 07/07/2021   Acute kidney injury ([place]) 07/07/2021   Dementia with behavioral disturbance 12/22/2020   Hypertension    Special screening for malignant neoplasms, colon    Benign neoplasm of cecum    Benign neoplasm of  ascending colon    Benign neoplasm of transverse colon    Benign neoplasm of sigmoid colon     Palliative Care Assessment & Plan    Recommendations/Plan: Recommend outpatient palliative with transition to hospice care when family is ready.   Code Status:    Code Status Orders  (From admission, onward)           Start     Ordered   07/07/21 1840  Full code  Continuous        07/07/21 1841           Code Status History     Date Active Date Inactive Code Status Order ID Comments User Context   07/07/2021 1839 07/07/2021 1841 Full Code [id number]  Collier Bullock, MD ED   12/22/2020 0446 12/22/2020 1801 Full Code [id number]  Ward, Delice Bison, DO ED       Prognosis:  < 6 months Poor PO intake. Dementia   Care plan was discussed with attending  Thank you for allowing the Palliative Medicine Team to assist in the care of this patient.       Total Time 15 min Prolonged Time Billed  no       Greater than 50%  of this time was spent counseling and coordinating care related to the above assessment and plan.  Asencion Gowda, NP  Please contact Palliative Medicine Team phone at [phone] for questions and concerns.

## 2021-07-11 NOTE — Discharge Instructions (Signed)
Outpatient palliative care follow-up

## 2021-07-11 NOTE — Progress Notes (Signed)
Sitter present

## 2021-07-11 NOTE — Discharge Summary (Signed)
Dillon Schultz NAME: Dillon Schultz    MR#:  [id number]  DATE OF BIRTH:  1946-01-03  DATE OF ADMISSION:  07/07/2021 ADMITTING PHYSICIAN: Collier Bullock, MD  DATE OF DISCHARGE: 07/11/2021  PRIMARY CARE PHYSICIAN: [name], MD    ADMISSION DIAGNOSIS:  Syncope and collapse [R55] Weakness [R53.1] AKI (acute kidney injury) ([place]) [N17.9] Acute kidney injury ([place]) [N17.9] Syncope, unspecified syncope type [R55]  DISCHARGE DIAGNOSIS:  acute on chronic kidney disease IV acute metabolic encephalopathy advanced dementia hypertension  SECONDARY DIAGNOSIS:   Past Medical History:  Diagnosis Date   1st degree AV block    Allergic rhinitis, cause unspecified    Arthritis    Back pain    Dental caries    Functional constipation    Gout    Hyperlipidemia    Hypertension    Lumbar herniated disc    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 male with medical history significant for hypertension, dyslipidemia, first degree AV block, chronic diastolic CHF who was brought into the ER for evaluation of what his son describes as " patient falling out and laying on the ground"   patient was found to have elevated creatinine of 3.6. Baseline is 1.4.     Acute Renal failure CKD-IIIA-- progress toIV failure to thrive --Most medication induced/poor po intake --baseline creat 1.4 --creat 3.66--3.2--BMP in am --Hold lisinopril, HCTZ and furosemide (recently started 40 mg qd for leg edema per PCP) --received  IV fluid resuscitation-- patient pulled out five IVs. Unable to give IV fluids. Encouraging oral fluid intake. -- renal ultrasound  no  obstructive uropathy --Consult nephrology if no improvement in renal function in 24 hours --1/15-- will try to give IV normal saline bolus today. Patient gets agitated and does not stay in place and hands not able to give continuous IVF --1/16-- patient is pulled his sixth IV  out. He is not eating today. Has not taken meds. Was very agitated swinging at the nurse. Received Haldol and Zyprexa. Dr. Weber Cooks to see patient. -- palliative care to see patient. Poor prognosis. --1/17-- discussed with patient's son Dillon Schultz on the phone. He is unable to come to a face-to-face meeting with me and palliative care. He understands patient has poor prognosis overall given his advanced dementia and other comorbidities. He wants to take patient home. Recommended follow-up outpatient palliative care and if worsens transition to hospice care. Discussed code status son wants him to be full code for now.   Acute metabolic encephalopathy with severe agitation, acute delirium Dementia -- patient was placed under IVC last night. -- Sitter at bedside -- psychiatry consultation placed. Started on Zyprexa and Depakote --Continue citalopram   Syncope and collapse suspect due to severe dehydration Likely related to volume depletion/hypotension from diuretic use  --consult with Dr Corky Sox cardiology-- recommends Monitor arrhythmia and will need monitor at discharge--he may not keep it on due to his severe dementia   Hypoglycemia -- thieves IV fluids with detent. Sugars are stable at present.    Overall poor prognosis long-term     Procedures: Family communication :Spoke  with Dillon Schultz son 1/17 Consults : psychiatry CODE STATUS: full DVT Prophylaxis :heparin Level of care: Telemetry Cardiac Status is: Inpatient  discharged home with outpatient palliative care  CONSULTS OBTAINED:    DRUG ALLERGIES:   Allergies  Allergen Reactions   Metformin Shortness Of Breath    Made him  feel "crazy"    Glucophage [Metformin Hcl]     Made him feel "crazy"     DISCHARGE MEDICATIONS:   Allergies as of 07/11/2021       Reactions   Metformin Shortness Of Breath   Made him feel "crazy"    Glucophage [metformin Hcl]    Made him feel "crazy"         Medication List     STOP taking  these medications    gabapentin 100 MG capsule Commonly known as: NEURONTIN   lisinopril 20 MG tablet Commonly known as: ZESTRIL   lisinopril-hydrochlorothiazide 20-25 MG tablet Commonly known as: ZESTORETIC       TAKE these medications    amLODipine 10 MG tablet Commonly known as: NORVASC Take 1 tablet (10 mg total) by mouth daily. Start taking on: July 12, 2021   citalopram 40 MG tablet Commonly known as: CELEXA Take 40 mg by mouth daily.   divalproex 125 MG DR tablet Commonly known as: DEPAKOTE Take 1 tablet (125 mg total) by mouth every 12 (twelve) hours.   hydrALAZINE 25 MG tablet Commonly known as: APRESOLINE Take 1 tablet (25 mg total) by mouth 2 (two) times daily.        If you experience worsening of your admission symptoms, develop shortness of breath, life threatening emergency, suicidal or homicidal thoughts you must seek medical attention immediately by calling 911 or calling your MD immediately  if symptoms less severe.  You Must read complete instructions/literature along with all the possible adverse reactions/side effects for all the Medicines you take and that have been prescribed to you. Take any new Medicines after you have completely understood and accept all the possible adverse reactions/side effects.   Please note  You were cared for by a hospitalist during your hospital stay. If you have any questions about your discharge medications or the care you received while you were in the hospital after you are discharged, you can call the unit and asked to speak with the hospitalist on call if the hospitalist that took care of you is not available. Once you are discharged, your primary care physician will handle any further medical issues. Please note that NO REFILLS for any discharge medications will be authorized once you are discharged, as it is imperative that you return to your primary care physician (or establish a relationship with a primary care  physician if you do not have one) for your aftercare needs so that they can reassess your need for medications and monitor your lab values.  DATA REVIEW:   CBC  Recent Labs  Lab 07/08/21 0553  WBC 4.8  HGB 12.3*  HCT 37.3*  PLT 124*    Chemistries  Recent Labs  Lab 07/07/21 1544 07/08/21 0553 07/10/21 0146  NA 138   < > 133*  K 4.9   < > 3.6  CL 98   < > 95*  CO2 30   < > 31  GLUCOSE 106*   < > 172*  BUN 68*   < > 46*  CREATININE 3.66*   < > 2.34*  CALCIUM 9.5   < > 8.8*  AST 14*  --   --   ALT 7  --   --   ALKPHOS 77  --   --   BILITOT 0.7  --   --    < > = values in this interval not displayed.    Microbiology Results   Recent Results (from the past 240  hour(s))  Resp Panel by RT-PCR (Flu A&B, Covid)     Status: None   Collection Time: 07/07/21  4:43 PM   Specimen: Nasopharyngeal(NP) swabs in vial transport medium  Result Value Ref Range Status   SARS Coronavirus 2 by RT PCR NEGATIVE NEGATIVE Final    Comment: (NOTE) SARS-CoV-2 target nucleic acids are NOT DETECTED.  The SARS-CoV-2 RNA is generally detectable in upper respiratory specimens during the acute phase of infection. The lowest concentration of SARS-CoV-2 viral copies this assay can detect is 138 copies/mL. A negative result does not preclude SARS-Cov-2 infection and should not be used as the sole basis for treatment or other patient management decisions. A negative result may occur with  improper specimen collection/handling, submission of specimen other than nasopharyngeal swab, presence of viral mutation(s) within the areas targeted by this assay, and inadequate number of viral copies(<138 copies/mL). A negative result must be combined with clinical observations, patient history, and epidemiological information. The expected result is Negative.  Fact Sheet for Patients:  EntrepreneurPulse.com.au  Fact Sheet for Healthcare Providers:   IncredibleEmployment.be  This test is no t yet approved or cleared by the [place] FDA and  has been authorized for detection and/or diagnosis of SARS-CoV-2 by FDA under an Emergency Use Authorization (EUA). This EUA will remain  in effect (meaning this test can be used) for the duration of the COVID-19 declaration under Section 564(b)(1) of the Act, 21 U.S.C.section 360bbb-3(b)(1), unless the authorization is terminated  or revoked sooner.       Influenza A by PCR NEGATIVE NEGATIVE Final   Influenza B by PCR NEGATIVE NEGATIVE Final    Comment: (NOTE) The Xpert Xpress SARS-CoV-2/FLU/RSV plus assay is intended as an aid in the diagnosis of influenza from Nasopharyngeal swab specimens and should not be used as a sole basis for treatment. Nasal washings and aspirates are unacceptable for Xpert Xpress SARS-CoV-2/FLU/RSV testing.  Fact Sheet for Patients: EntrepreneurPulse.com.au  Fact Sheet for Healthcare Providers: IncredibleEmployment.be  This test is not yet approved or cleared by the [place] FDA and has been authorized for detection and/or diagnosis of SARS-CoV-2 by FDA under an Emergency Use Authorization (EUA). This EUA will remain in effect (meaning this test can be used) for the duration of the COVID-19 declaration under Section 564(b)(1) of the Act, 21 U.S.C. section 360bbb-3(b)(1), unless the authorization is terminated or revoked.  Performed at [facility], [address] [place], [place] [address]     RADIOLOGY:  No results found.   CODE STATUS:     Code Status Orders  (From admission, onward)           Start     Ordered   07/07/21 1840  Full code  Continuous        07/07/21 1841           Code Status History     Date Active Date Inactive Code Status Order ID Comments User Context   07/07/2021 1839 07/07/2021 1841 Full Code [id number]  Collier Bullock, MD ED    12/22/2020 0446 12/22/2020 1801 Full Code [id number]  Ward, Delice Bison, DO ED        TOTAL TIME TAKING CARE OF THIS PATIENT: 35 minutes.    Fritzi Mandes M.D  [facility]    CC: Primary care physician; [name], MD

## 2021-07-20 ENCOUNTER — Inpatient Hospital Stay
Admission: EM | Admit: 2021-07-20 | Discharge: 2021-07-25 | DRG: 641 | Disposition: A | Discharge status: Home / Self Care | Payer: Medicare Other | Attending: Internal Medicine | Admitting: Internal Medicine

## 2021-07-20 ENCOUNTER — Emergency Department: Payer: Medicare Other

## 2021-07-20 ENCOUNTER — Other Ambulatory Visit: Payer: Self-pay

## 2021-07-20 DIAGNOSIS — Z79899 Other long term (current) drug therapy: Secondary | ICD-10-CM

## 2021-07-20 DIAGNOSIS — M1990 Unspecified osteoarthritis, unspecified site: Secondary | ICD-10-CM | POA: Diagnosis present

## 2021-07-20 DIAGNOSIS — Z87891 Personal history of nicotine dependence: Secondary | ICD-10-CM

## 2021-07-20 DIAGNOSIS — Z7982 Long term (current) use of aspirin: Secondary | ICD-10-CM

## 2021-07-20 DIAGNOSIS — E861 Hypovolemia: Secondary | ICD-10-CM | POA: Diagnosis present

## 2021-07-20 DIAGNOSIS — I5032 Chronic diastolic (congestive) heart failure: Secondary | ICD-10-CM | POA: Diagnosis present

## 2021-07-20 DIAGNOSIS — E1122 Type 2 diabetes mellitus with diabetic chronic kidney disease: Secondary | ICD-10-CM | POA: Diagnosis present

## 2021-07-20 DIAGNOSIS — Z20822 Contact with and (suspected) exposure to covid-19: Secondary | ICD-10-CM | POA: Diagnosis present

## 2021-07-20 DIAGNOSIS — E860 Dehydration: Principal | ICD-10-CM | POA: Diagnosis present

## 2021-07-20 DIAGNOSIS — D631 Anemia in chronic kidney disease: Secondary | ICD-10-CM | POA: Diagnosis present

## 2021-07-20 DIAGNOSIS — E785 Hyperlipidemia, unspecified: Secondary | ICD-10-CM | POA: Diagnosis present

## 2021-07-20 DIAGNOSIS — R001 Bradycardia, unspecified: Secondary | ICD-10-CM | POA: Diagnosis present

## 2021-07-20 DIAGNOSIS — J309 Allergic rhinitis, unspecified: Secondary | ICD-10-CM | POA: Diagnosis present

## 2021-07-20 DIAGNOSIS — F03911 Unspecified dementia, unspecified severity, with agitation: Secondary | ICD-10-CM | POA: Diagnosis present

## 2021-07-20 DIAGNOSIS — E875 Hyperkalemia: Secondary | ICD-10-CM | POA: Diagnosis present

## 2021-07-20 DIAGNOSIS — D696 Thrombocytopenia, unspecified: Secondary | ICD-10-CM | POA: Diagnosis present

## 2021-07-20 DIAGNOSIS — Z888 Allergy status to other drugs, medicaments and biological substances status: Secondary | ICD-10-CM

## 2021-07-20 DIAGNOSIS — F0393 Unspecified dementia, unspecified severity, with mood disturbance: Secondary | ICD-10-CM | POA: Diagnosis present

## 2021-07-20 DIAGNOSIS — M109 Gout, unspecified: Secondary | ICD-10-CM | POA: Diagnosis present

## 2021-07-20 DIAGNOSIS — R4182 Altered mental status, unspecified: Secondary | ICD-10-CM

## 2021-07-20 DIAGNOSIS — I130 Hypertensive heart and chronic kidney disease with heart failure and stage 1 through stage 4 chronic kidney disease, or unspecified chronic kidney disease: Secondary | ICD-10-CM | POA: Diagnosis present

## 2021-07-20 DIAGNOSIS — N184 Chronic kidney disease, stage 4 (severe): Secondary | ICD-10-CM | POA: Diagnosis present

## 2021-07-20 DIAGNOSIS — G8929 Other chronic pain: Secondary | ICD-10-CM | POA: Diagnosis present

## 2021-07-20 DIAGNOSIS — M549 Dorsalgia, unspecified: Secondary | ICD-10-CM | POA: Diagnosis present

## 2021-07-20 DIAGNOSIS — R809 Proteinuria, unspecified: Secondary | ICD-10-CM | POA: Diagnosis present

## 2021-07-20 DIAGNOSIS — G4700 Insomnia, unspecified: Secondary | ICD-10-CM | POA: Diagnosis present

## 2021-07-20 DIAGNOSIS — N179 Acute kidney failure, unspecified: Secondary | ICD-10-CM | POA: Diagnosis present

## 2021-07-20 LAB — CBG MONITORING, ED: Glucose-Capillary: 92 mg/dL (ref 70–99)

## 2021-07-20 LAB — CBC
HCT: 42.2 % (ref 39.0–52.0)
Hemoglobin: 13.6 g/dL (ref 13.0–17.0)
MCH: 29.8 pg (ref 26.0–34.0)
MCHC: 32.2 g/dL (ref 30.0–36.0)
MCV: 92.3 fL (ref 80.0–100.0)
Platelets: 181 10*3/uL (ref 150–400)
RBC: 4.57 MIL/uL (ref 4.22–5.81)
RDW: 13.2 % (ref 11.5–15.5)
WBC: 4.5 10*3/uL (ref 4.0–10.5)
nRBC: 0 % (ref 0.0–0.2)

## 2021-07-20 LAB — COMPREHENSIVE METABOLIC PANEL
ALT: 11 U/L (ref 0–44)
AST: 21 U/L (ref 15–41)
Albumin: 4.3 g/dL (ref 3.5–5.0)
Alkaline Phosphatase: 81 U/L (ref 38–126)
Anion gap: 14 (ref 5–15)
BUN: 105 mg/dL — ABNORMAL HIGH (ref 8–23)
CO2: 23 mmol/L (ref 22–32)
Calcium: 10 mg/dL (ref 8.9–10.3)
Chloride: 102 mmol/L (ref 98–111)
Creatinine, Ser: 5.14 mg/dL — ABNORMAL HIGH (ref 0.61–1.24)
GFR, Estimated: 11 mL/min — ABNORMAL LOW (ref >60)
Glucose, Bld: 107 mg/dL — ABNORMAL HIGH (ref 70–99)
Potassium: 5.1 mmol/L (ref 3.5–5.1)
Sodium: 139 mmol/L (ref 135–145)
Total Bilirubin: 0.6 mg/dL (ref 0.3–1.2)
Total Protein: 8 g/dL (ref 6.5–8.1)

## 2021-07-20 NOTE — ED Provider Notes (Signed)
[facility] Provider Note    Event Date/Time   First MD Initiated Contact with Patient 07/20/21 2318     (approximate)   History   Altered Mental Status   HPI  Level V caveat: Limited by decreased LOC  Dillon Schultz is a 76 y.o. male brought to the ED via EMS from home with a chief complaint of altered mentation and decreased oral intake as well as increased BLE swelling.  Son also told EMS patient has been having vomiting and diarrhea.  Patient does not know why he is here and denies complaints.  Denies chest pain, shortness of breath, abdominal pain.     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     Active Problem List   Patient Active Problem List   Diagnosis Date Noted   Delirium    Syncope and collapse 07/07/2021   Acute kidney injury ([place]) 07/07/2021   Dementia with behavioral disturbance 12/22/2020   Hypertension    Special screening for malignant neoplasms, colon    Benign neoplasm of cecum    Benign neoplasm of ascending colon    Benign neoplasm of transverse colon    Benign neoplasm of sigmoid colon      Past Surgical History   Past Surgical History:  Procedure Laterality Date   COLONOSCOPY  2010   COLONOSCOPY WITH PROPOFOL N/A 12/16/2015   Procedure: COLONOSCOPY WITH PROPOFOL;  Surgeon: Lucilla Lame, MD;  Location: [place];  Service: Endoscopy;  Laterality: N/A;   POLYPECTOMY  12/16/2015   Procedure: POLYPECTOMY;  Surgeon: Lucilla Lame, MD;  Location: [place];  Service: Endoscopy;;     Home Medications   Prior to Admission medications   Medication Sig Start Date End Date Taking? Authorizing Provider  amLODipine (NORVASC) 10 MG  tablet Take 1 tablet (10 mg total) by mouth daily. 07/12/21  Yes Fritzi Mandes, MD  aspirin 81 MG chewable tablet Chew 81 mg by mouth daily. 07/18/21  Yes [provider]  citalopram (CELEXA) 40 MG tablet Take 40 mg by mouth daily. 03/27/21  Yes [provider]  divalproex (DEPAKOTE) 125 MG DR tablet Take 1 tablet (125 mg total) by mouth every 12 (twelve) hours. 07/11/21  Yes Fritzi Mandes, MD  hydrALAZINE (APRESOLINE) 25 MG tablet Take 1 tablet (25 mg total) by mouth 2 (two) times daily. 07/11/21  Yes Fritzi Mandes, MD  azelastine (ASTELIN) 137 MCG/SPRAY nasal spray Place 2 sprays into the nose 2 (two) times daily. Reported on 12/08/2015  12/22/20  [provider]  cetirizine (ZYRTEC) 10 MG tablet Take 10 mg by mouth daily. Reported on 12/08/2015  12/22/20  [provider]  furosemide (LASIX) 20 MG tablet Take 20 mg by mouth daily.  12/22/20  [provider]     Allergies  Metformin and Glucophage [metformin hcl]   Family History  History reviewed. No pertinent family history.   Physical Exam  Triage Vital Signs: ED Triage Vitals  Enc Vitals Group     BP 07/20/21 2227 (!) 155/89     Pulse Rate 07/20/21 2227 (!) 55     Resp 07/20/21 2227 18  Temp 07/20/21 2227 97.6 F (36.4 C)     Temp Source 07/20/21 2227 Oral     SpO2 07/20/21 2227 100 %     Weight 07/20/21 2229 156 lb 6.4 oz (70.9 kg)     Height 07/20/21 2229 5\' 8"  (1.727 m)     Head Circumference --      Peak Flow --      Pain Score 07/20/21 2229 0     Pain Loc --      Pain Edu? --      Excl. in [place]? --     Updated Vital Signs: BP (!) 147/79 (BP Location: Right Arm)    Pulse (!) 54    Temp 97.6 F (36.4 C) (Oral)    Resp 20    Ht 5\' 8"  (1.727 m)    Wt 70.9 kg    SpO2 96%    BMI 23.78 kg/m    General: Awake, no distress.  CV:  Bradycardic.  Good peripheral perfusion.  Resp:  Normal effort.  CTA B. Abd:  Nontender to light or deep palpation.  No distention.  Other:  2+ BLE nonpitting  edema with venous stasis ulcers.  2+ palpable pulses.   ED Results / Procedures / Treatments  Labs (all labs ordered are listed, but only abnormal results are displayed) Labs Reviewed  COMPREHENSIVE METABOLIC PANEL - Abnormal; Notable for the following components:      Result Value   Glucose, Bld 107 (*)    BUN 105 (*)    Creatinine, Ser 5.14 (*)    GFR, Estimated 11 (*)    All other components within normal limits  URINALYSIS, ROUTINE W REFLEX MICROSCOPIC - Abnormal; Notable for the following components:   Hgb urine dipstick TRACE (*)    Protein, ur 30 (*)    All other components within normal limits  URINALYSIS, MICROSCOPIC (REFLEX) - Abnormal; Notable for the following components:   Non Squamous Epithelial PRESENT (*)    All other components within normal limits  TROPONIN I (HIGH SENSITIVITY) - Abnormal; Notable for the following components:   Troponin I (High Sensitivity) 97 (*)    All other components within normal limits  RESP PANEL BY RT-PCR (FLU A&B, COVID) ARPGX2  CBC  CBG MONITORING, ED     EKG  ED ECG REPORT I, Missy Baksh J, the attending physician, personally viewed and interpreted this ECG.   Date: 07/21/2021  EKG Time: 2235  Rate: 53  Rhythm: sinus bradycardia  Axis: Normal  Intervals:nonspecific intraventricular conduction delay  ST&T Change: Nonspecific    RADIOLOGY I have personally reviewed patient's chest x-ray, CT head, CT abdomen/pelvis as well as the radiology interpretation:  Chest x-ray: No acute cardiopulmonary process  CT head: No ICH  CT abdomen/pelvis: No acute findings  Official radiology report(s): CT Abdomen Pelvis Wo Contrast  Result Date: 07/21/2021 CLINICAL DATA:  Nausea, vomiting, diarrhea for EXAM: CT ABDOMEN AND PELVIS WITHOUT CONTRAST TECHNIQUE: Multidetector CT imaging of the abdomen and pelvis was performed following the standard protocol without IV contrast. RADIATION DOSE REDUCTION: This exam was performed according to  the departmental dose-optimization program which includes automated exposure control, adjustment of the mA and/or kV according to patient size and/or use of iterative reconstruction technique. COMPARISON:  09/05/2009 FINDINGS: Lower chest: Heart no acute abnormality Hepatobiliary: No focal hepatic abnormality. Gallbladder unremarkable. Pancreas: No focal abnormality or ductal dilatation. Spleen: No focal abnormality.  Normal size. Adrenals/Urinary Tract: No adrenal abnormality. No focal renal abnormality. No stones or  hydronephrosis. Urinary bladder is unremarkable. Stomach/Bowel: Stomach, large and small bowel grossly unremarkable. Vascular/Lymphatic: Aortic atherosclerosis. No evidence of aneurysm or adenopathy. Reproductive: No visible focal abnormality. Other: No free fluid or free air. Musculoskeletal: No acute bony abnormality. IMPRESSION: No acute findings in the abdomen or pelvis. Aortic atherosclerosis. Electronically Signed   By: Rolm Baptise M.D.   On: 07/21/2021 00:36   CT Head Wo Contrast  Result Date: 07/21/2021 CLINICAL DATA:  Mental status changes EXAM: CT HEAD WITHOUT CONTRAST TECHNIQUE: Contiguous axial images were obtained from the base of the skull through the vertex without intravenous contrast. RADIATION DOSE REDUCTION: This exam was performed according to the departmental dose-optimization program which includes automated exposure control, adjustment of the mA and/or kV according to patient size and/or use of iterative reconstruction technique. COMPARISON:  12/22/2020 FINDINGS: Brain: There is atrophy and chronic small vessel disease changes. No acute intracranial abnormality. Specifically, no hemorrhage, hydrocephalus, mass lesion, acute infarction, or significant intracranial injury. Vascular: No hyperdense vessel or unexpected calcification. Skull: No acute calvarial abnormality. Sinuses/Orbits: No acute findings Other: None IMPRESSION: Atrophy, chronic microvascular disease. No acute  intracranial abnormality. Electronically Signed   By: Rolm Baptise M.D.   On: 07/21/2021 00:38   DG Chest Port 1 View  Result Date: 07/20/2021 CLINICAL DATA:  Altered mental status EXAM: PORTABLE CHEST 1 VIEW COMPARISON:  07/07/2021 FINDINGS: The heart size and mediastinal contours are within normal limits. Both lungs are clear. The visualized skeletal structures are unremarkable. IMPRESSION: No active disease. Electronically Signed   By: Donavan Foil M.D.   On: 07/20/2021 23:41     PROCEDURES:  Critical Care performed: No  .1-3 Lead EKG Interpretation Performed by: Paulette Blanch, MD Authorized by: Paulette Blanch, MD     Interpretation: abnormal     ECG rate:  58   ECG rate assessment: bradycardic     Rhythm: sinus bradycardia     Ectopy: none     Conduction: normal   Comments:     Patient placed on cardiac monitor to evaluate for arrhythmias   MEDICATIONS ORDERED IN ED: Medications  sodium chloride 0.9 % bolus 1,000 mL (1,000 mLs Intravenous New Bag/Given 07/21/21 0042)     IMPRESSION / MDM / ASSESSMENT AND PLAN / ED COURSE  I reviewed the triage vital signs and the nursing notes.                             76 year old male brought to the ED for altered mental status. Differential diagnosis includes, but is not limited to, alcohol, illicit or prescription medications, or other toxic ingestion; intracranial pathology such as stroke or intracerebral hemorrhage; fever or infectious causes including sepsis; hypoxemia and/or hypercarbia; uremia; trauma; endocrine related disorders such as diabetes, hypoglycemia, and thyroid-related diseases; hypertensive encephalopathy; etc. I have personally reviewed patient's chart and see that he was hospitalized at this facility from 1/13-1/17/2023 for dementia with behavioral disturbance, acute kidney injury, hypertension and syncope.  The patient is on the cardiac monitor to evaluate for evidence of arrhythmia and/or significant heart rate  changes.  Laboratory results notable for normal WBC, acute renal failure with BUN 105/CR 5.14, mildly elevated troponin 97 which is likely secondary to demand ischemia.  Will initiate IV fluid resuscitation.  Patient is pending CT head, abdomen/pelvis.  Anticipate hospitalization.  Clinical Course as of 07/21/21 0047  Fri Jul 21, 2021  0047 CT head, abdomen/pelvis unremarkable for acute abnormalities.  Respiratory panel is negative.  Will consult hospitalist services for evaluation and admission for altered mental status secondary to uremia caused by acute renal failure. [JS]    Clinical Course User Index [JS] Paulette Blanch, MD     FINAL CLINICAL IMPRESSION(S) / ED DIAGNOSES   Final diagnoses:  Altered mental status, unspecified altered mental status type  Dehydration  Acute renal failure, unspecified acute renal failure type ([place])     Rx / DC Orders   ED Discharge Orders     None        Note:  This document was prepared using Dragon voice recognition software and may include unintentional dictation errors.   Paulette Blanch, MD 07/21/21 [phone]

## 2021-07-20 NOTE — ED Triage Notes (Signed)
Pt to ED from home via [place].  Pt's son reported that pt has not been eating, has been more confused lately, and has increased bilateral leg swelling. Family also reported that pt had been having vomiting and diarrhea. Pt denies complaints on arrival. Pt able to stand and pivot from wheelchair to bed w/o difficulty, pt alert and oriented to person and place, disoriented to time and situation.

## 2021-07-21 ENCOUNTER — Emergency Department: Payer: Medicare Other

## 2021-07-21 ENCOUNTER — Other Ambulatory Visit: Payer: Medicare Other

## 2021-07-21 ENCOUNTER — Encounter: Payer: Self-pay | Admitting: Family Medicine

## 2021-07-21 ENCOUNTER — Other Ambulatory Visit: Payer: Self-pay

## 2021-07-21 DIAGNOSIS — G4700 Insomnia, unspecified: Secondary | ICD-10-CM | POA: Diagnosis present

## 2021-07-21 DIAGNOSIS — I5032 Chronic diastolic (congestive) heart failure: Secondary | ICD-10-CM | POA: Diagnosis present

## 2021-07-21 DIAGNOSIS — N189 Chronic kidney disease, unspecified: Secondary | ICD-10-CM | POA: Diagnosis not present

## 2021-07-21 DIAGNOSIS — M549 Dorsalgia, unspecified: Secondary | ICD-10-CM | POA: Diagnosis present

## 2021-07-21 DIAGNOSIS — E875 Hyperkalemia: Secondary | ICD-10-CM | POA: Diagnosis present

## 2021-07-21 DIAGNOSIS — N184 Chronic kidney disease, stage 4 (severe): Secondary | ICD-10-CM | POA: Diagnosis present

## 2021-07-21 DIAGNOSIS — I130 Hypertensive heart and chronic kidney disease with heart failure and stage 1 through stage 4 chronic kidney disease, or unspecified chronic kidney disease: Secondary | ICD-10-CM | POA: Diagnosis present

## 2021-07-21 DIAGNOSIS — F0393 Unspecified dementia, unspecified severity, with mood disturbance: Secondary | ICD-10-CM | POA: Diagnosis present

## 2021-07-21 DIAGNOSIS — N179 Acute kidney failure, unspecified: Secondary | ICD-10-CM | POA: Diagnosis present

## 2021-07-21 DIAGNOSIS — E861 Hypovolemia: Secondary | ICD-10-CM | POA: Diagnosis present

## 2021-07-21 DIAGNOSIS — J309 Allergic rhinitis, unspecified: Secondary | ICD-10-CM | POA: Diagnosis present

## 2021-07-21 DIAGNOSIS — M1990 Unspecified osteoarthritis, unspecified site: Secondary | ICD-10-CM | POA: Diagnosis present

## 2021-07-21 DIAGNOSIS — E860 Dehydration: Principal | ICD-10-CM

## 2021-07-21 DIAGNOSIS — F03918 Unspecified dementia, unspecified severity, with other behavioral disturbance: Secondary | ICD-10-CM | POA: Diagnosis not present

## 2021-07-21 DIAGNOSIS — R4182 Altered mental status, unspecified: Secondary | ICD-10-CM | POA: Insufficient documentation

## 2021-07-21 DIAGNOSIS — G9341 Metabolic encephalopathy: Secondary | ICD-10-CM | POA: Diagnosis not present

## 2021-07-21 DIAGNOSIS — Z7982 Long term (current) use of aspirin: Secondary | ICD-10-CM | POA: Diagnosis not present

## 2021-07-21 DIAGNOSIS — R001 Bradycardia, unspecified: Secondary | ICD-10-CM | POA: Diagnosis present

## 2021-07-21 DIAGNOSIS — Z888 Allergy status to other drugs, medicaments and biological substances status: Secondary | ICD-10-CM | POA: Diagnosis not present

## 2021-07-21 DIAGNOSIS — D631 Anemia in chronic kidney disease: Secondary | ICD-10-CM | POA: Diagnosis present

## 2021-07-21 DIAGNOSIS — F03911 Unspecified dementia, unspecified severity, with agitation: Secondary | ICD-10-CM | POA: Diagnosis present

## 2021-07-21 DIAGNOSIS — D696 Thrombocytopenia, unspecified: Secondary | ICD-10-CM | POA: Diagnosis present

## 2021-07-21 DIAGNOSIS — E785 Hyperlipidemia, unspecified: Secondary | ICD-10-CM | POA: Diagnosis present

## 2021-07-21 DIAGNOSIS — E1122 Type 2 diabetes mellitus with diabetic chronic kidney disease: Secondary | ICD-10-CM | POA: Diagnosis present

## 2021-07-21 DIAGNOSIS — G8929 Other chronic pain: Secondary | ICD-10-CM | POA: Diagnosis present

## 2021-07-21 DIAGNOSIS — I10 Essential (primary) hypertension: Secondary | ICD-10-CM | POA: Diagnosis not present

## 2021-07-21 DIAGNOSIS — Z20822 Contact with and (suspected) exposure to covid-19: Secondary | ICD-10-CM | POA: Diagnosis present

## 2021-07-21 DIAGNOSIS — M109 Gout, unspecified: Secondary | ICD-10-CM | POA: Diagnosis present

## 2021-07-21 DIAGNOSIS — Z79899 Other long term (current) drug therapy: Secondary | ICD-10-CM | POA: Diagnosis not present

## 2021-07-21 LAB — URINALYSIS, ROUTINE W REFLEX MICROSCOPIC
Bilirubin Urine: NEGATIVE
Glucose, UA: NEGATIVE mg/dL
Ketones, ur: NEGATIVE mg/dL
Leukocytes,Ua: NEGATIVE
Nitrite: NEGATIVE
Protein, ur: 30 mg/dL — AB
Specific Gravity, Urine: 1.01 (ref 1.005–1.030)
pH: 5 (ref 5.0–8.0)

## 2021-07-21 LAB — URINALYSIS, MICROSCOPIC (REFLEX): Bacteria, UA: NONE SEEN

## 2021-07-21 LAB — CBC
HCT: 38.7 % — ABNORMAL LOW (ref 39.0–52.0)
Hemoglobin: 12.6 g/dL — ABNORMAL LOW (ref 13.0–17.0)
MCH: 29.3 pg (ref 26.0–34.0)
MCHC: 32.6 g/dL (ref 30.0–36.0)
MCV: 90 fL (ref 80.0–100.0)
Platelets: 158 10*3/uL (ref 150–400)
RBC: 4.3 MIL/uL (ref 4.22–5.81)
RDW: 13.4 % (ref 11.5–15.5)
WBC: 4.3 10*3/uL (ref 4.0–10.5)
nRBC: 0 % (ref 0.0–0.2)

## 2021-07-21 LAB — BASIC METABOLIC PANEL
Anion gap: 12 (ref 5–15)
BUN: 102 mg/dL — ABNORMAL HIGH (ref 8–23)
CO2: 22 mmol/L (ref 22–32)
Calcium: 9.3 mg/dL (ref 8.9–10.3)
Chloride: 106 mmol/L (ref 98–111)
Creatinine, Ser: 4.21 mg/dL — ABNORMAL HIGH (ref 0.61–1.24)
GFR, Estimated: 14 mL/min — ABNORMAL LOW (ref >60)
Glucose, Bld: 82 mg/dL (ref 70–99)
Potassium: 5.3 mmol/L — ABNORMAL HIGH (ref 3.5–5.1)
Sodium: 140 mmol/L (ref 135–145)

## 2021-07-21 LAB — RESP PANEL BY RT-PCR (FLU A&B, COVID) ARPGX2
Influenza A by PCR: NEGATIVE
Influenza B by PCR: NEGATIVE
SARS Coronavirus 2 by RT PCR: NEGATIVE

## 2021-07-21 LAB — TROPONIN I (HIGH SENSITIVITY): Troponin I (High Sensitivity): 97 ng/L — ABNORMAL HIGH (ref <18)

## 2021-07-21 MED ORDER — ACETAMINOPHEN 650 MG RE SUPP: 650.0000 mg | Freq: Four times a day (QID) | RECTAL | Status: DC | PRN

## 2021-07-21 MED ORDER — VALPROATE SODIUM 100 MG/ML IV SOLN
125.0000 mg | Freq: Two times a day (BID) | INTRAVENOUS | Status: DC
  Administered 2021-07-22: 02:00:00 125 mg via INTRAVENOUS
  Filled 2021-07-21 (×3): qty 1.25

## 2021-07-21 MED ORDER — LORAZEPAM BOLUS VIA INFUSION
0.5000 mg | INTRAVENOUS | Status: DC | PRN
  Filled 2021-07-21: qty 1

## 2021-07-21 MED ORDER — HYDRALAZINE HCL 50 MG PO TABS
25.0000 mg | ORAL_TABLET | Freq: Two times a day (BID) | ORAL | Status: DC
  Administered 2021-07-22 – 2021-07-25 (×4): 25 mg via ORAL
  Filled 2021-07-21 (×5): qty 1

## 2021-07-21 MED ORDER — HEPARIN SODIUM (PORCINE) 5000 UNIT/ML IJ SOLN
5000.0000 [IU] | Freq: Three times a day (TID) | INTRAMUSCULAR | Status: DC
  Administered 2021-07-21 – 2021-07-24 (×7): 5000 [IU] via SUBCUTANEOUS
  Filled 2021-07-21 (×7): qty 1

## 2021-07-21 MED ORDER — SODIUM CHLORIDE 0.9 % IV BOLUS
1000.0000 mL | Freq: Once | INTRAVENOUS | Status: AC
  Administered 2021-07-21: 1000 mL via INTRAVENOUS

## 2021-07-21 MED ORDER — ZIPRASIDONE MESYLATE 20 MG IM SOLR: 10.0000 mg | Freq: Once | INTRAMUSCULAR | Status: DC

## 2021-07-21 MED ORDER — DIVALPROEX SODIUM 125 MG PO DR TAB
125.0000 mg | DELAYED_RELEASE_TABLET | Freq: Two times a day (BID) | ORAL | Status: DC
  Filled 2021-07-21 (×2): qty 1

## 2021-07-21 MED ORDER — ONDANSETRON HCL 4 MG/2ML IJ SOLN: 4.0000 mg | Freq: Four times a day (QID) | INTRAMUSCULAR | Status: DC | PRN

## 2021-07-21 MED ORDER — LORAZEPAM 2 MG/ML IJ SOLN
0.5000 mg | INTRAMUSCULAR | Status: DC | PRN
  Administered 2021-07-21: 0.5 mg via INTRAVENOUS
  Filled 2021-07-21 (×4): qty 1

## 2021-07-21 MED ORDER — TRAZODONE HCL 50 MG PO TABS
25.0000 mg | ORAL_TABLET | Freq: Every evening | ORAL | Status: DC | PRN
  Administered 2021-07-24 (×2): 25 mg via ORAL
  Filled 2021-07-21 (×2): qty 1

## 2021-07-21 MED ORDER — ACETAMINOPHEN 325 MG PO TABS
650.0000 mg | ORAL_TABLET | Freq: Four times a day (QID) | ORAL | Status: DC | PRN
  Administered 2021-07-24: 650 mg via ORAL
  Filled 2021-07-21: qty 2

## 2021-07-21 MED ORDER — SODIUM CHLORIDE 0.9 % IV SOLN: INTRAVENOUS | Status: DC

## 2021-07-21 MED ORDER — MAGNESIUM HYDROXIDE 400 MG/5ML PO SUSP
30.0000 mL | Freq: Every day | ORAL | Status: DC | PRN
  Filled 2021-07-21: qty 30

## 2021-07-21 MED ORDER — AMLODIPINE BESYLATE 10 MG PO TABS
10.0000 mg | ORAL_TABLET | Freq: Every day | ORAL | Status: DC
  Administered 2021-07-22 – 2021-07-25 (×3): 10 mg via ORAL
  Filled 2021-07-21 (×4): qty 1

## 2021-07-21 MED ORDER — HYDRALAZINE HCL 50 MG PO TABS
25.0000 mg | ORAL_TABLET | Freq: Two times a day (BID) | ORAL | Status: DC
  Administered 2021-07-21: 25 mg via ORAL
  Filled 2021-07-21: qty 1

## 2021-07-21 MED ORDER — HALOPERIDOL LACTATE 5 MG/ML IJ SOLN
2.0000 mg | Freq: Four times a day (QID) | INTRAMUSCULAR | Status: DC | PRN
  Administered 2021-07-21 – 2021-07-24 (×6): 2 mg via INTRAMUSCULAR
  Filled 2021-07-21 (×5): qty 1

## 2021-07-21 MED ORDER — SODIUM ZIRCONIUM CYCLOSILICATE 10 G PO PACK
10.0000 g | PACK | Freq: Once | ORAL | Status: AC
  Administered 2021-07-21: 10 g via ORAL
  Filled 2021-07-21: qty 1

## 2021-07-21 MED ORDER — ONDANSETRON HCL 4 MG PO TABS: 4.0000 mg | ORAL_TABLET | Freq: Four times a day (QID) | ORAL | Status: DC | PRN

## 2021-07-21 MED ORDER — CITALOPRAM HYDROBROMIDE 20 MG PO TABS
40.0000 mg | ORAL_TABLET | Freq: Every day | ORAL | Status: DC
  Administered 2021-07-22 – 2021-07-25 (×3): 40 mg via ORAL
  Filled 2021-07-21 (×4): qty 2

## 2021-07-21 MED ORDER — AMLODIPINE BESYLATE 5 MG PO TABS: 10.0000 mg | ORAL_TABLET | Freq: Every day | ORAL | Status: DC

## 2021-07-21 MED ORDER — HALOPERIDOL LACTATE 5 MG/ML IJ SOLN
INTRAMUSCULAR | Status: AC
  Filled 2021-07-21: qty 1

## 2021-07-21 MED ORDER — ASPIRIN 81 MG PO CHEW
81.0000 mg | CHEWABLE_TABLET | Freq: Every day | ORAL | Status: DC
  Administered 2021-07-22 – 2021-07-25 (×3): 81 mg via ORAL
  Filled 2021-07-21 (×4): qty 1

## 2021-07-21 MED ORDER — HYDRALAZINE HCL 20 MG/ML IJ SOLN
10.0000 mg | Freq: Four times a day (QID) | INTRAMUSCULAR | Status: DC | PRN
  Administered 2021-07-21: 19:00:00 10 mg via INTRAVENOUS
  Filled 2021-07-21: qty 1

## 2021-07-21 NOTE — Progress Notes (Addendum)
Pt is very wanting to leave floor and is very non compliant with staff. Pt is not re-directable. NP Randol Kern made aware.   Update  605: MD Mansy made aware and placed order. Will continue to monitor.

## 2021-07-21 NOTE — ED Notes (Signed)
Pt  placed  under  IVC  papers  informed  RN matthew/  Administrator, sports

## 2021-07-21 NOTE — Progress Notes (Signed)
After pt requested to call someone, RN called contact listed for pt. RN spoke with Lacretia Leigh (pt's son). RN clarified that pt has been diagnosed with dementia. Pt's son stated that pt has been getting progressively more confused over the last several weeks.

## 2021-07-21 NOTE — H&P (Addendum)
[place]   PATIENT NAME: Dillon Schultz    MR#:  [id number]  DATE OF BIRTH:  Jan 03, 1946  DATE OF ADMISSION:  07/20/2021  PRIMARY CARE PHYSICIAN: [name], MD   Patient is coming from: Home   REQUESTING/REFERRING PHYSICIAN: Lurline Hare, MD  CHIEF COMPLAINT:   Chief Complaint  Patient presents with   Altered Mental Status    HISTORY OF PRESENT ILLNESS:  Dillon Schultz is a 76 y.o. male with medical history significant for hypertension, type 2 diabetes mellitus, dementia with behavioral changes, dyslipidemia, chronic diastolic CHF gout and chronic back pain, presented to the ER with acute onset of altered mental status with generalized weakness and decreased p.o. intake.  She has been having nausea and vomiting as well as diarrhea.  No fever or chills.  No cough or wheezing or dyspnea.  No dysuria, oliguria or hematuria or flank pain.  He has bilateral lower extremity edema without orthopnea or paroxysmal nocturnal dyspnea.  The main history is obtained from his son very near and otherwise history is limited due to the patient's dementia.  ED Course: Upon presentation to the emergency room, heart rate was 109 with respiratory rate of 6 and later 14 then 20 and pulse oximetry of 92 and later 96% on room air. Labs revealed a BUN of 105 and creatinine 5.14 with a potassium of 5.1 and otherwise unremarkable CMP.  Previous BUN and creatinine were 46 and 2.34 10 days ago.  High-sensitivity troponin I was 97.  CBC was within normal. EKG as reviewed by me : EKG showed sinus bradycardia with rate of 53 with short PR interval and nonspecific intraventricular conduction delay.. Imaging: Portable chest x-ray showed no acute cardiopulmonary disease.  Abdominal pelvic CT scan revealed aortic atherosclerosis with no acute abdominal or pelvic findings.  Noncontrasted CT scan revealed atrophy with chronic microvascular ischemic disease with no acute intracranial normalities.  The patient was  given 1 L bolus of IV normal saline.  He will be admitted to a medical bed for further evaluation and management. 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  -Type 2 diabetes mellitus - Chronic diastolic CHF -Dementia with behavioral changes PAST SURGICAL HISTORY:   Past Surgical History:  Procedure Laterality Date   COLONOSCOPY  2010   COLONOSCOPY WITH PROPOFOL N/A 12/16/2015   Procedure: COLONOSCOPY WITH PROPOFOL;  Surgeon: Lucilla Lame, MD;  Location: [place];  Service: Endoscopy;  Laterality: N/A;   POLYPECTOMY  12/16/2015   Procedure: POLYPECTOMY;  Surgeon: Lucilla Lame, MD;  Location: [place];  Service: Endoscopy;;    SOCIAL HISTORY:   Social History   Tobacco Use   Smoking status: Former    Years: 5.00    Types: Cigarettes    Quit date: 06/26/1967    Years since quitting: 54.1   Smokeless tobacco: Never  Substance Use Topics   Alcohol use: Never    FAMILY HISTORY:  History reviewed. No pertinent family history.  DRUG ALLERGIES:   Allergies  Allergen Reactions   Metformin Shortness Of Breath    Made him feel "crazy"    Glucophage [Metformin Hcl]  Made him feel "crazy"     REVIEW OF SYSTEMS:   ROS As per history of present illness. All pertinent systems were reviewed above. Constitutional, HEENT, cardiovascular, respiratory, GI, GU, musculoskeletal, neuro, psychiatric, endocrine, integumentary and hematologic systems were reviewed and are otherwise negative/unremarkable except for positive findings mentioned above in the HPI.   MEDICATIONS AT HOME:   Prior to Admission medications   Medication Sig Start Date End Date Taking? Authorizing Provider  amLODipine (NORVASC) 10 MG tablet Take 1  tablet (10 mg total) by mouth daily. 07/12/21  Yes Fritzi Mandes, MD  aspirin 81 MG chewable tablet Chew 81 mg by mouth daily. 07/18/21  Yes [provider]  citalopram (CELEXA) 40 MG tablet Take 40 mg by mouth daily. 03/27/21  Yes [provider]  divalproex (DEPAKOTE) 125 MG DR tablet Take 1 tablet (125 mg total) by mouth every 12 (twelve) hours. 07/11/21  Yes Fritzi Mandes, MD  hydrALAZINE (APRESOLINE) 25 MG tablet Take 1 tablet (25 mg total) by mouth 2 (two) times daily. 07/11/21  Yes Fritzi Mandes, MD  azelastine (ASTELIN) 137 MCG/SPRAY nasal spray Place 2 sprays into the nose 2 (two) times daily. Reported on 12/08/2015  12/22/20  [provider]  cetirizine (ZYRTEC) 10 MG tablet Take 10 mg by mouth daily. Reported on 12/08/2015  12/22/20  [provider]  furosemide (LASIX) 20 MG tablet Take 20 mg by mouth daily.  12/22/20  [provider]      VITAL SIGNS:  Blood pressure (!) 147/79, pulse (!) 54, temperature 97.6 F (36.4 C), temperature source Oral, resp. rate 20, height 5\' 8"  (1.727 m), weight 70.9 kg, SpO2 96 %.  PHYSICAL EXAMINATION:  Physical Exam  GENERAL:  76 y.o.-year-old African-American male patient lying in the bed with no acute distress.  EYES: Pupils equal, round, reactive to light and accommodation. No scleral icterus. Extraocular muscles intact.  HEENT: Head atraumatic, normocephalic. Oropharynx with dry mucous membrane and tongue and nasopharynx clear.  NECK:  Supple, no jugular venous distention. No thyroid enlargement, no tenderness.  LUNGS: Normal breath sounds bilaterally, no wheezing, rales,rhonchi or crepitation. No use of accessory muscles of respiration.  CARDIOVASCULAR: Regular rate and rhythm, S1, S2 normal. No murmurs, rubs, or gallops.  ABDOMEN: Soft, nondistended, nontender. Bowel sounds present. No organomegaly or mass.  EXTREMITIES: 2-3+ soft bilateral lower extremity pitting edema with no cyanosis, or clubbing.   NEUROLOGIC: Cranial nerves II through XII are intact. Muscle strength 5/5 in all extremities. Sensation intact. Gait not checked.  PSYCHIATRIC: The patient is alert and oriented x 3.  Normal affect and good eye contact. SKIN: No obvious rash, lesion, or ulcer.   LABORATORY PANEL:   CBC Recent Labs  Lab 07/20/21 2248  WBC 4.5  HGB 13.6  HCT 42.2  PLT 181   ------------------------------------------------------------------------------------------------------------------  Chemistries  Recent Labs  Lab 07/20/21 2248  NA 139  K 5.1  CL 102  CO2 23  GLUCOSE 107*  BUN 105*  CREATININE 5.14*  CALCIUM 10.0  AST 21  ALT 11  ALKPHOS 81  BILITOT 0.6   ------------------------------------------------------------------------------------------------------------------  Cardiac Enzymes No results for input(s): TROPONINI in the last 168 hours. ------------------------------------------------------------------------------------------------------------------  RADIOLOGY:  CT Abdomen Pelvis Wo Contrast  Result Date: 07/21/2021 CLINICAL DATA:  Nausea, vomiting, diarrhea for EXAM: CT ABDOMEN AND PELVIS WITHOUT CONTRAST TECHNIQUE: Multidetector CT imaging of the abdomen and pelvis was performed following the standard protocol without IV contrast. RADIATION DOSE REDUCTION: This exam was performed according to the departmental  dose-optimization program which includes automated exposure control, adjustment of the mA and/or kV according to patient size and/or use of iterative reconstruction technique. COMPARISON:  09/05/2009 FINDINGS: Lower chest: Heart no acute abnormality Hepatobiliary: No focal hepatic abnormality. Gallbladder unremarkable. Pancreas: No focal abnormality or ductal dilatation. Spleen: No focal abnormality.  Normal size. Adrenals/Urinary Tract: No adrenal abnormality. No focal renal abnormality. No stones or hydronephrosis. Urinary bladder is unremarkable. Stomach/Bowel: Stomach,  large and small bowel grossly unremarkable. Vascular/Lymphatic: Aortic atherosclerosis. No evidence of aneurysm or adenopathy. Reproductive: No visible focal abnormality. Other: No free fluid or free air. Musculoskeletal: No acute bony abnormality. IMPRESSION: No acute findings in the abdomen or pelvis. Aortic atherosclerosis. Electronically Signed   By: Rolm Baptise M.D.   On: 07/21/2021 00:36   CT Head Wo Contrast  Result Date: 07/21/2021 CLINICAL DATA:  Mental status changes EXAM: CT HEAD WITHOUT CONTRAST TECHNIQUE: Contiguous axial images were obtained from the base of the skull through the vertex without intravenous contrast. RADIATION DOSE REDUCTION: This exam was performed according to the departmental dose-optimization program which includes automated exposure control, adjustment of the mA and/or kV according to patient size and/or use of iterative reconstruction technique. COMPARISON:  12/22/2020 FINDINGS: Brain: There is atrophy and chronic small vessel disease changes. No acute intracranial abnormality. Specifically, no hemorrhage, hydrocephalus, mass lesion, acute infarction, or significant intracranial injury. Vascular: No hyperdense vessel or unexpected calcification. Skull: No acute calvarial abnormality. Sinuses/Orbits: No acute findings Other: None IMPRESSION: Atrophy, chronic microvascular disease. No acute intracranial abnormality. Electronically Signed   By: Rolm Baptise M.D.   On: 07/21/2021 00:38   DG Chest Port 1 View  Result Date: 07/20/2021 CLINICAL DATA:  Altered mental status EXAM: PORTABLE CHEST 1 VIEW COMPARISON:  07/07/2021 FINDINGS: The heart size and mediastinal contours are within normal limits. Both lungs are clear. The visualized skeletal structures are unremarkable. IMPRESSION: No active disease. Electronically Signed   By: Donavan Foil M.D.   On: 07/20/2021 23:41      IMPRESSION AND PLAN:  Principal Problem:   AKI (acute kidney injury) ([place])  1.  Acute kidney  injury, likely prerenal, on top of stage IV chronic kidney disease.  This is like secondary to dehydration with associated nausea, vomiting and diarrhea.  The patient has subsequent acute metabolic cephalopathy - The patient will be admitted to a medical bed. - We will continue hydration with IV normal saline. - We will follow serial BMPs. - We will avoid nephrotoxins. - Nephrology consult will be obtained. -I notified Dr. Candiss Norse about the patient. - The patient will be placed on as needed antiemetics. - We will follow neurochecks.  2.  Essential hypertension. - We will continue amlodipine and hydralazine.  3.  Depression. - We will continue Celexa.  4.  Type 2 diabetes mellitus, diet managed. - He will be placed on supplement coverage with NovoLog.  DVT prophylaxis: Lovenox. Code Status: full code. Family Communication:  The plan of care was discussed in details with the patient (and family). I answered all questions. The patient agreed to proceed with the above mentioned plan. Further management will depend upon hospital course. Disposition Plan: Back to previous home environment Consults called: Nephrology. All the records are reviewed and case discussed with ED provider.  Status is: Inpatient   At the time of the admission, it appears that the appropriate admission status for this patient is inpatient.  This is judged to be reasonable and necessary in order to provide the required intensity of  service to ensure the patient's safety given the presenting symptoms, physical exam findings and initial radiographic and laboratory data in the context of comorbid conditions.  The patient requires inpatient status due to high intensity of service, high risk of further deterioration and high frequency of surveillance required.  I certify that at the time of admission, it is my clinical judgment that the patient will require inpatient hospital care extending more than 2 midnights.                             Dispo: The patient is from: Home              Anticipated d/c is to: Home              Patient currently is not medically stable to d/c.              Difficult to place patient: No  Dillon Schultz M.D on 07/21/2021 at 1:39 AM  [facility]   From 7 PM-7 AM, contact night-coverage [email or web address]  CC: Primary care physician; [name], MD

## 2021-07-21 NOTE — ED Notes (Signed)
Patient transported to CT 

## 2021-07-21 NOTE — Progress Notes (Addendum)
Cross Cover Antihypertensives amlodipine and hydralazine restarted as per rounding note today.  As needed hydralazine IV also ordered for systolic pressures above 462  Patient refusing oral meds. Depakote chagned to IV route

## 2021-07-21 NOTE — Progress Notes (Addendum)
Pt is swinging arms with staff while trying to hook pt up with tele-monitoring, vitals signs check and IV fluids. Pt disoriented x 4. Pt refused all care at this time. Charge nurse Remo Lipps made aware. Will continue to monitor.  Update 0422: Security was called and pt was very cooperative with care. Pt IV fluids started. Hook up on the monitor and vital signs taken. Will continue to monitor.
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
## 2021-07-21 NOTE — Progress Notes (Signed)
PROGRESS NOTE    Dillon Schultz  NLG:[id number] DOB: [birth year] DOA: 07/20/2021 PCP: [name], MD    Assessment & Plan:   Principal Problem:   AKI (acute kidney injury) ([place])   AKI on CKDIV: possibly secondary to dehydration. Cr is trending down from day prior. Continue on IVFs. Avoid nephrotoxic meds  Hyperkalemia: lokelma x 1.   Dementia: as per pt's son. Continue w/ supportive care  HTN: continue on home dose of amlodipine, hydralazine   Depression: severity unknown. Continue on home dose of celexa  DM2: likely well controlled. Diet controlled    DVT prophylaxis: heparin  Code Status: full  Family Communication: discussed pt's care w/ pt's son and answered his questions  Disposition Plan: depends on PT/OT recs   Level of care: Med-Surg  Status is: Inpatient  Remains inpatient appropriate because: severity of illness    Consultants:  Nephro   Procedures:   Antimicrobials:   Subjective: Pt is very lethargic after receiving ativan   Objective: Vitals:   07/21/21 0230 07/21/21 0416 07/21/21 0738 07/21/21 1100  BP: 138/79 (!) 126/54 122/60 124/65  Pulse:  (!) 111 (!) 106 100  Resp:  20 18 20   Temp:   98.2 F (36.8 C) 98.4 F (36.9 C)  TempSrc:   Oral Oral  SpO2:  95% 96% 98%  Weight:      Height:       No intake or output data in the 24 hours ending 07/21/21 1502 Filed Weights   07/20/21 2229  Weight: 70.9 kg    Examination:  General exam: Appears calm and comfortable  Respiratory system: Clear to auscultation. Respiratory effort normal. Cardiovascular system: S1 & S2 +. No rubs, gallops or clicks.  Gastrointestinal system: Abdomen is nondistended, soft and nontender. Normal bowel sounds heard. Central nervous system: Lethargic. Moves all extremities  Psychiatry: Judgement and insight appear poor     Data Reviewed: I have personally reviewed following labs and imaging studies  CBC: Recent Labs  Lab 07/20/21 2248 07/21/21 0702   WBC 4.5 4.3  HGB 13.6 12.6*  HCT 42.2 38.7*  MCV 92.3 90.0  PLT 181 081   Basic Metabolic Panel: Recent Labs  Lab 07/20/21 2248 07/21/21 0702  NA 139 140  K 5.1 5.3*  CL 102 106  CO2 23 22  GLUCOSE 107* 82  BUN 105* 102*  CREATININE 5.14* 4.21*  CALCIUM 10.0 9.3   GFR: Estimated Creatinine Clearance: 14.2 mL/min (A) (by C-G formula based on SCr of 4.21 mg/dL (H)). Liver Function Tests: Recent Labs  Lab 07/20/21 2248  AST 21  ALT 11  ALKPHOS 81  BILITOT 0.6  PROT 8.0  ALBUMIN 4.3   No results for input(s): LIPASE, AMYLASE in the last 168 hours. No results for input(s): AMMONIA in the last 168 hours. Coagulation Profile: No results for input(s): INR, PROTIME in the last 168 hours. Cardiac Enzymes: No results for input(s): CKTOTAL, CKMB, CKMBINDEX, TROPONINI in the last 168 hours. BNP (last 3 results) No results for input(s): PROBNP in the last 8760 hours. HbA1C: No results for input(s): HGBA1C in the last 72 hours. CBG: Recent Labs  Lab 07/20/21 2300  GLUCAP 92   Lipid Profile: No results for input(s): CHOL, HDL, LDLCALC, TRIG, CHOLHDL, LDLDIRECT in the last 72 hours. Thyroid Function Tests: No results for input(s): TSH, T4TOTAL, FREET4, T3FREE, THYROIDAB in the last 72 hours. Anemia Panel: No results for input(s): VITAMINB12, FOLATE, FERRITIN, TIBC, IRON, RETICCTPCT in the last 72 hours. Sepsis  Labs: No results for input(s): PROCALCITON, LATICACIDVEN in the last 168 hours.  Recent Results (from the past 240 hour(s))  Resp Panel by RT-PCR (Flu A&B, Covid) Urine, Clean Catch     Status: None   Collection Time: 07/20/21 11:36 PM   Specimen: Urine, Clean Catch; Nasopharyngeal(NP) swabs in vial transport medium  Result Value Ref Range Status   SARS Coronavirus 2 by RT PCR NEGATIVE NEGATIVE Final    Comment: (NOTE) SARS-CoV-2 target nucleic acids are NOT DETECTED.  The SARS-CoV-2 RNA is generally detectable in upper respiratory specimens during the acute  phase of infection. The lowest concentration of SARS-CoV-2 viral copies this assay can detect is 138 copies/mL. A negative result does not preclude SARS-Cov-2 infection and should not be used as the sole basis for treatment or other patient management decisions. A negative result may occur with  improper specimen collection/handling, submission of specimen other than nasopharyngeal swab, presence of viral mutation(s) within the areas targeted by this assay, and inadequate number of viral copies(<138 copies/mL). A negative result must be combined with clinical observations, patient history, and epidemiological information. The expected result is Negative.  Fact Sheet for Patients:  EntrepreneurPulse.com.au  Fact Sheet for Healthcare Providers:  IncredibleEmployment.be  This test is no t yet approved or cleared by the [place] FDA and  has been authorized for detection and/or diagnosis of SARS-CoV-2 by FDA under an Emergency Use Authorization (EUA). This EUA will remain  in effect (meaning this test can be used) for the duration of the COVID-19 declaration under Section 564(b)(1) of the Act, 21 U.S.C.section 360bbb-3(b)(1), unless the authorization is terminated  or revoked sooner.       Influenza A by PCR NEGATIVE NEGATIVE Final   Influenza B by PCR NEGATIVE NEGATIVE Final    Comment: (NOTE) The Xpert Xpress SARS-CoV-2/FLU/RSV plus assay is intended as an aid in the diagnosis of influenza from Nasopharyngeal swab specimens and should not be used as a sole basis for treatment. Nasal washings and aspirates are unacceptable for Xpert Xpress SARS-CoV-2/FLU/RSV testing.  Fact Sheet for Patients: EntrepreneurPulse.com.au  Fact Sheet for Healthcare Providers: IncredibleEmployment.be  This test is not yet approved or cleared by the [place] FDA and has been authorized for detection and/or diagnosis of  SARS-CoV-2 by FDA under an Emergency Use Authorization (EUA). This EUA will remain in effect (meaning this test can be used) for the duration of the COVID-19 declaration under Section 564(b)(1) of the Act, 21 U.S.C. section 360bbb-3(b)(1), unless the authorization is terminated or revoked.  Performed at [facility], [address] [place], [place] [address]          Radiology Studies: CT Abdomen Pelvis Wo Contrast  Result Date: 07/21/2021 CLINICAL DATA:  Nausea, vomiting, diarrhea for EXAM: CT ABDOMEN AND PELVIS WITHOUT CONTRAST TECHNIQUE: Multidetector CT imaging of the abdomen and pelvis was performed following the standard protocol without IV contrast. RADIATION DOSE REDUCTION: This exam was performed according to the departmental dose-optimization program which includes automated exposure control, adjustment of the mA and/or kV according to patient size and/or use of iterative reconstruction technique. COMPARISON:  09/05/2009 FINDINGS: Lower chest: Heart no acute abnormality Hepatobiliary: No focal hepatic abnormality. Gallbladder unremarkable. Pancreas: No focal abnormality or ductal dilatation. Spleen: No focal abnormality.  Normal size. Adrenals/Urinary Tract: No adrenal abnormality. No focal renal abnormality. No stones or hydronephrosis. Urinary bladder is unremarkable. Stomach/Bowel: Stomach, large and small bowel grossly unremarkable. Vascular/Lymphatic: Aortic atherosclerosis. No evidence of aneurysm or adenopathy. Reproductive: No visible  focal abnormality. Other: No free fluid or free air. Musculoskeletal: No acute bony abnormality. IMPRESSION: No acute findings in the abdomen or pelvis. Aortic atherosclerosis. Electronically Signed   By: Rolm Baptise M.D.   On: 07/21/2021 00:36   CT Head Wo Contrast  Result Date: 07/21/2021 CLINICAL DATA:  Mental status changes EXAM: CT HEAD WITHOUT CONTRAST TECHNIQUE: Contiguous axial images were obtained from the base of the skull  through the vertex without intravenous contrast. RADIATION DOSE REDUCTION: This exam was performed according to the departmental dose-optimization program which includes automated exposure control, adjustment of the mA and/or kV according to patient size and/or use of iterative reconstruction technique. COMPARISON:  12/22/2020 FINDINGS: Brain: There is atrophy and chronic small vessel disease changes. No acute intracranial abnormality. Specifically, no hemorrhage, hydrocephalus, mass lesion, acute infarction, or significant intracranial injury. Vascular: No hyperdense vessel or unexpected calcification. Skull: No acute calvarial abnormality. Sinuses/Orbits: No acute findings Other: None IMPRESSION: Atrophy, chronic microvascular disease. No acute intracranial abnormality. Electronically Signed   By: Rolm Baptise M.D.   On: 07/21/2021 00:38   DG Chest Port 1 View  Result Date: 07/20/2021 CLINICAL DATA:  Altered mental status EXAM: PORTABLE CHEST 1 VIEW COMPARISON:  07/07/2021 FINDINGS: The heart size and mediastinal contours are within normal limits. Both lungs are clear. The visualized skeletal structures are unremarkable. IMPRESSION: No active disease. Electronically Signed   By: Donavan Foil M.D.   On: 07/20/2021 23:41        Scheduled Meds:  aspirin  81 mg Oral Daily   citalopram  40 mg Oral Daily   divalproex  125 mg Oral Q12H   haloperidol lactate       heparin  5,000 Units Subcutaneous Q8H   Continuous Infusions:  sodium chloride 100 mL/hr at 07/21/21 0828     LOS: 0 days    Time spent: 30 mins     Wyvonnia Dusky, MD [facility] Pager [phone]  If 7PM-7AM, please contact night-coverage 07/21/2021, 3:02 PM
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
## 2021-07-21 NOTE — Progress Notes (Addendum)
Cross Cover Patient agitated, combative and unable to redirect. Safety issue for the patient and the staff. Dose of geodon ordered as well as sitter Geodon discontinued as Dr. Sidney Ace also contacted regarding patient behavior and haldol had been ordered

## 2021-07-21 NOTE — Consult Note (Signed)
IVC completed in the ED, on his chart.    Waylan Boga, PMHNP

## 2021-07-21 NOTE — Consult Note (Signed)
[facility] Consult Note:07/21/2021    Date of Admission:  07/20/2021           Reason for Consult:  AKI   Referring Provider: Wyvonnia Dusky, MD Primary Care Provider: [name], MD   History of Presenting Illness:  Dillon Schultz is a 76 y.o. male  Please note the patient is sedated while seen.  He did not wake up for interview.  He had just received sedatives for agitation.  Information is obtained from primary team and from the staff. He has several medical problems as outlined below along with dementia with behavioral changes. He was brought to the emergency room for acute onset of altered mental status with generalized weakness and decreased oral intake. Noted to have elevated creatinine of 5.14.  Nephrology consult has not been requested for evaluation.  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 No pertinent family history.   OBJECTIVE: Blood pressure 122/60, pulse (!) 106, temperature 98.2 F (36.8 C), temperature source Oral, resp. rate 18, height 5\' 8"  (1.727 m), weight 70.9 kg, SpO2 96 %.  Physical Exam  General appearance: Frail, elderly, resting quietly Pulmonary: Normal breathing effort, no crackles Cardiovascular: irregular rhythm, no rub or gallop Abdomen: Soft, nontender, no guarding Extremities: Bilateral pitting edema  present Skin: Warm, open wound on left shin, chronic stasis changes bilaterally lower legs Neuro: Sedated when seen  Lab Results Lab Results  Component Value Date   WBC 4.3 07/21/2021   HGB 12.6 (L) 07/21/2021   HCT 38.7 (L) 07/21/2021   MCV 90.0 07/21/2021   PLT 158 07/21/2021    Lab Results  Component Value Date   CREATININE 4.21 (H) 07/21/2021   BUN 102 (H) 07/21/2021   NA 140 07/21/2021   K 5.3 (H) 07/21/2021   CL 106 07/21/2021   CO2 22 07/21/2021    Lab Results  Component Value Date   ALT 11 07/20/2021   AST 21 07/20/2021   ALKPHOS 81 07/20/2021   BILITOT 0.6 07/20/2021     Microbiology: Recent Results (from the past 240 hour(s))  Resp Panel by RT-PCR (Flu A&B, Covid) Urine, Clean Catch     Status: None   Collection Time: 07/20/21 11:36 PM   Specimen: Urine, Clean Catch; Nasopharyngeal(NP) swabs in vial transport medium  Result Value Ref Range Status   SARS Coronavirus 2 by RT PCR NEGATIVE NEGATIVE Final    Comment: (NOTE) SARS-CoV-2 target nucleic acids are NOT DETECTED.  The SARS-CoV-2 RNA is generally detectable in upper respiratory specimens during the acute phase of infection. The lowest concentration of SARS-CoV-2 viral copies this assay can detect is 138 copies/mL. A negative result does not preclude SARS-Cov-2 infection and  should not be used as the sole basis for treatment or other patient management decisions. A negative result may occur with  improper specimen collection/handling, submission of specimen other than nasopharyngeal swab, presence of viral mutation(s) within the areas targeted by this assay, and inadequate number of viral copies(<138 copies/mL). A negative result must be combined with clinical observations, patient history, and epidemiological information. The expected result is Negative.  Fact Sheet for Patients:  EntrepreneurPulse.com.au  Fact Sheet for Healthcare Providers:   IncredibleEmployment.be  This test is no t yet approved or cleared by the [place] FDA and  has been authorized for detection and/or diagnosis of SARS-CoV-2 by FDA under an Emergency Use Authorization (EUA). This EUA will remain  in effect (meaning this test can be used) for the duration of the COVID-19 declaration under Section 564(b)(1) of the Act, 21 U.S.C.section 360bbb-3(b)(1), unless the authorization is terminated  or revoked sooner.       Influenza A by PCR NEGATIVE NEGATIVE Final   Influenza B by PCR NEGATIVE NEGATIVE Final    Comment: (NOTE) The Xpert Xpress SARS-CoV-2/FLU/RSV plus assay is intended as an aid in the diagnosis of influenza from Nasopharyngeal swab specimens and should not be used as a sole basis for treatment. Nasal washings and aspirates are unacceptable for Xpert Xpress SARS-CoV-2/FLU/RSV testing.  Fact Sheet for Patients: EntrepreneurPulse.com.au  Fact Sheet for Healthcare Providers: IncredibleEmployment.be  This test is not yet approved or cleared by the [place] FDA and has been authorized for detection and/or diagnosis of SARS-CoV-2 by FDA under an Emergency Use Authorization (EUA). This EUA will remain in effect (meaning this test can be used) for the duration of the COVID-19 declaration under Section 564(b)(1) of the Act, 21 U.S.C. section 360bbb-3(b)(1), unless the authorization is terminated or revoked.  Performed at [facility], [address] [place], [place] [address]     Medications: Scheduled Meds:  amLODipine  10 mg Oral Daily   aspirin  81 mg Oral Daily   citalopram  40 mg Oral Daily   divalproex  125 mg Oral Q12H   haloperidol lactate       heparin  5,000 Units Subcutaneous Q8H   hydrALAZINE  25 mg Oral BID   Continuous Infusions:  sodium chloride 100 mL/hr at 07/21/21 0828   PRN Meds:.acetaminophen **OR** acetaminophen, haloperidol lactate,  LORazepam, magnesium hydroxide, ondansetron **OR** ondansetron (ZOFRAN) IV, traZODone  Allergies  Allergen Reactions   Metformin Shortness Of Breath    Made him feel "crazy"    Glucophage [Metformin Hcl]     Made him feel "crazy"     Urinalysis: Recent Labs    07/20/21 2336  COLORURINE YELLOW  LABSPEC 1.010  PHURINE 5.0  GLUCOSEU NEGATIVE  HGBUR TRACE*  [place] NEGATIVE  PROTEINUR 30*  NITRITE NEGATIVE  LEUKOCYTESUR NEGATIVE      Imaging: CT Abdomen Pelvis Wo Contrast  Result Date: 07/21/2021 CLINICAL DATA:  Nausea, vomiting, diarrhea for EXAM: CT ABDOMEN AND PELVIS WITHOUT CONTRAST TECHNIQUE: Multidetector CT imaging of the abdomen and pelvis was performed following the standard protocol without IV contrast. RADIATION DOSE REDUCTION: This exam was performed according to the departmental dose-optimization program which includes automated exposure control, adjustment of the mA and/or kV according to patient size and/or use of iterative reconstruction technique. COMPARISON:  09/05/2009 FINDINGS: Lower chest: Heart no acute abnormality Hepatobiliary: No focal hepatic abnormality. Gallbladder unremarkable. Pancreas: No focal abnormality or ductal dilatation. Spleen: No focal abnormality.  Normal size. Adrenals/Urinary Tract:  No adrenal abnormality. No focal renal abnormality. No stones or hydronephrosis. Urinary bladder is unremarkable. Stomach/Bowel: Stomach, large and small bowel grossly unremarkable. Vascular/Lymphatic: Aortic atherosclerosis. No evidence of aneurysm or adenopathy. Reproductive: No visible focal abnormality. Other: No free fluid or free air. Musculoskeletal: No acute bony abnormality. IMPRESSION: No acute findings in the abdomen or pelvis. Aortic atherosclerosis. Electronically Signed   By: Rolm Baptise M.D.   On: 07/21/2021 00:36   CT Head Wo Contrast  Result Date: 07/21/2021 CLINICAL DATA:  Mental status changes EXAM: CT HEAD WITHOUT  CONTRAST TECHNIQUE: Contiguous axial images were obtained from the base of the skull through the vertex without intravenous contrast. RADIATION DOSE REDUCTION: This exam was performed according to the departmental dose-optimization program which includes automated exposure control, adjustment of the mA and/or kV according to patient size and/or use of iterative reconstruction technique. COMPARISON:  12/22/2020 FINDINGS: Brain: There is atrophy and chronic small vessel disease changes. No acute intracranial abnormality. Specifically, no hemorrhage, hydrocephalus, mass lesion, acute infarction, or significant intracranial injury. Vascular: No hyperdense vessel or unexpected calcification. Skull: No acute calvarial abnormality. Sinuses/Orbits: No acute findings Other: None IMPRESSION: Atrophy, chronic microvascular disease. No acute intracranial abnormality. Electronically Signed   By: Rolm Baptise M.D.   On: 07/21/2021 00:38   DG Chest Port 1 View  Result Date: 07/20/2021 CLINICAL DATA:  Altered mental status EXAM: PORTABLE CHEST 1 VIEW COMPARISON:  07/07/2021 FINDINGS: The heart size and mediastinal contours are within normal limits. Both lungs are clear. The visualized skeletal structures are unremarkable. IMPRESSION: No active disease. Electronically Signed   By: Donavan Foil M.D.   On: 07/20/2021 23:41      Assessment/Plan:  Dillon Schultz is a 76 y.o. male with medical problems of  dementia , HTN, depression, Type 2 DM, chronic diastolic CHF,  was admitted on 07/20/2021 for :  AKI (acute kidney injury) ([place]) [N17.9]  AKI -AKI is likely multifactorial.  With behavioral changes and poor oral intake and evidence of elevated BUN to creatinine ratio, volume depletion is high on differential. -Agree with IV fluid hydration.  Serum creatinine appears to be improving with hydration. -Continue supportive care -Due to underlying dementia with behavioral changes, likely not a candidate for chronic  dialysis.  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   CKD risk factors include atherosclerosis, age, diabetes  Hyperkalemia -Monitor for now.  Consider Lokelma or Veltassa once able to take oral medications.  Proteinuria -Obtain urine protein to creatinine ratio unable to provide urine sample  DM-2 with CKD - Lab Results  Component Value Date   HGBA1C 6.9 (H) 04/23/2012  No oral hypoglycemic agents noted in the home medication list.   HTN with lower extremity edema -Blood pressure is controlled without any antihypertensives at present.  Due to sedation, medications were not administered.  Can consider to restart amlodipine at low-dose if blood pressure is consistently over 765 systolic.       Dillon Schultz Candiss Norse 07/21/21

## 2021-07-22 DIAGNOSIS — N179 Acute kidney failure, unspecified: Secondary | ICD-10-CM

## 2021-07-22 DIAGNOSIS — F03918 Unspecified dementia, unspecified severity, with other behavioral disturbance: Secondary | ICD-10-CM | POA: Diagnosis not present

## 2021-07-22 DIAGNOSIS — D696 Thrombocytopenia, unspecified: Secondary | ICD-10-CM

## 2021-07-22 LAB — CBC
HCT: 37 % — ABNORMAL LOW (ref 39.0–52.0)
Hemoglobin: 11.9 g/dL — ABNORMAL LOW (ref 13.0–17.0)
MCH: 29 pg (ref 26.0–34.0)
MCHC: 32.2 g/dL (ref 30.0–36.0)
MCV: 90.2 fL (ref 80.0–100.0)
Platelets: 144 10*3/uL — ABNORMAL LOW (ref 150–400)
RBC: 4.1 MIL/uL — ABNORMAL LOW (ref 4.22–5.81)
RDW: 13.4 % (ref 11.5–15.5)
WBC: 5.1 10*3/uL (ref 4.0–10.5)
nRBC: 0 % (ref 0.0–0.2)

## 2021-07-22 LAB — BASIC METABOLIC PANEL
Anion gap: 8 (ref 5–15)
BUN: 70 mg/dL — ABNORMAL HIGH (ref 8–23)
CO2: 23 mmol/L (ref 22–32)
Calcium: 9.5 mg/dL (ref 8.9–10.3)
Chloride: 112 mmol/L — ABNORMAL HIGH (ref 98–111)
Creatinine, Ser: 2.73 mg/dL — ABNORMAL HIGH (ref 0.61–1.24)
GFR, Estimated: 23 mL/min — ABNORMAL LOW (ref >60)
Glucose, Bld: 64 mg/dL — ABNORMAL LOW (ref 70–99)
Potassium: 4.8 mmol/L (ref 3.5–5.1)
Sodium: 143 mmol/L (ref 135–145)

## 2021-07-22 MED ORDER — ADULT MULTIVITAMIN W/MINERALS CH
1.0000 | ORAL_TABLET | Freq: Every day | ORAL | Status: DC
  Administered 2021-07-25: 08:00:00 1 via ORAL
  Filled 2021-07-22 (×2): qty 1

## 2021-07-22 MED ORDER — ENSURE ENLIVE PO LIQD
237.0000 mL | Freq: Three times a day (TID) | ORAL | Status: DC
  Administered 2021-07-24 – 2021-07-25 (×3): 237 mL via ORAL

## 2021-07-22 MED ORDER — DIVALPROEX SODIUM 125 MG PO CSDR
125.0000 mg | DELAYED_RELEASE_CAPSULE | Freq: Two times a day (BID) | ORAL | Status: DC
  Administered 2021-07-22 – 2021-07-25 (×4): 125 mg via ORAL
  Filled 2021-07-22 (×8): qty 1

## 2021-07-22 MED ORDER — ZIPRASIDONE MESYLATE 20 MG IM SOLR
10.0000 mg | INTRAMUSCULAR | Status: AC
  Administered 2021-07-22: 10 mg via INTRAMUSCULAR
  Filled 2021-07-22: qty 20

## 2021-07-22 MED ORDER — ZIPRASIDONE MESYLATE 20 MG IM SOLR
10.0000 mg | Freq: Once | INTRAMUSCULAR | Status: DC
  Filled 2021-07-22: qty 20

## 2021-07-22 MED ORDER — HYDRALAZINE HCL 20 MG/ML IJ SOLN: 20.0000 mg | Freq: Four times a day (QID) | INTRAMUSCULAR | Status: DC | PRN

## 2021-07-22 NOTE — Progress Notes (Signed)
Cross Cover Patient is agitated and destructive. Unable to redirect. Dos of [place] ordered. Renewed sitter

## 2021-07-22 NOTE — Progress Notes (Signed)
Dillon Schultz  MRN: [id number]  DOB/AGE: 78-21-1947 78 y.o.  Primary Care Physician:Aycock, Edmonia Lynch, MD  Admit date: 07/20/2021  Chief Complaint:  Chief Complaint  Patient presents with   Altered Mental Status    S-Pt presented on  07/20/2021 with  Chief Complaint  Patient presents with   Altered Mental Status   Patient remains lethargic Patient did receive Geodon earlier.  Patient is unable to follow commands I did discuss the case with patient's team   Medications  amLODipine  10 mg Oral Daily   aspirin  81 mg Oral Daily   citalopram  40 mg Oral Daily   divalproex  125 mg Oral Q12H   feeding supplement  237 mL Oral TID BM   heparin  5,000 Units Subcutaneous Q8H   hydrALAZINE  25 mg Oral BID   [START ON 07/23/2021] multivitamin with minerals  1 tablet Oral Daily         ROS: Unable to get any data  Physical Exam: Vital signs in last 24 hours: Temp:  [97.6 F (36.4 C)-98.8 F (37.1 C)] 97.7 F (36.5 C) (01/28 1259) Pulse Rate:  [45-99] 78 (01/28 1259) Resp:  [16-19] 19 (01/28 0728) BP: (88-215)/(57-110) 191/110 (01/28 1259) SpO2:  [92 %-100 %] 100 % (01/28 1259) Weight change:  Last BM Date:  (unknown)  Intake/Output from previous day: 01/27 0701 - 01/28 0700 In: -  Out: 500 [Urine:500] No intake/output data recorded.   Physical Exam:  General- pt is  elderly, resting quietly, No apparent distress  Resp- No acute REsp distress, CTA B/L NO Rhonchi  CVS- S1S2 regular in rate and rhythm  GIT- BS+, soft, Non tender , Non distended  EXT- No LE Edema,  No Cyanosis    Lab Results:  CBC  Recent Labs    07/21/21 0702 07/22/21 0944  WBC 4.3 5.1  HGB 12.6* 11.9*  HCT 38.7* 37.0*  PLT 158 144*    BMET  Recent Labs    07/21/21 0702 07/22/21 0944  NA 140 143  K 5.3* 4.8  CL 106 112*  CO2 22 23  GLUCOSE 82 64*  BUN 102* 70*  CREATININE 4.21* 2.73*  CALCIUM 9.3 9.5      Most recent Creatinine trend  Lab Results  Component Value  Date   CREATININE 2.73 (H) 07/22/2021   CREATININE 4.21 (H) 07/21/2021   CREATININE 5.14 (H) 07/20/2021      MICRO   Recent Results (from the past 240 hour(s))  Resp Panel by RT-PCR (Flu A&B, Covid) Urine, Clean Catch     Status: None   Collection Time: 07/20/21 11:36 PM   Specimen: Urine, Clean Catch; Nasopharyngeal(NP) swabs in vial transport medium  Result Value Ref Range Status   SARS Coronavirus 2 by RT PCR NEGATIVE NEGATIVE Final    Comment: (NOTE) SARS-CoV-2 target nucleic acids are NOT DETECTED.  The SARS-CoV-2 RNA is generally detectable in upper respiratory specimens during the acute phase of infection. The lowest concentration of SARS-CoV-2 viral copies this assay can detect is 138 copies/mL. A negative result does not preclude SARS-Cov-2 infection and should not be used as the sole basis for treatment or other patient management decisions. A negative result may occur with  improper specimen collection/handling, submission of specimen other than nasopharyngeal swab, presence of viral mutation(s) within the areas targeted by this assay, and inadequate number of viral copies(<138 copies/mL). A negative result must be combined with clinical observations, patient history, and epidemiological information. The expected result is  Negative.  Fact Sheet for Patients:  EntrepreneurPulse.com.au  Fact Sheet for Healthcare Providers:  IncredibleEmployment.be  This test is no t yet approved or cleared by the [place] FDA and  has been authorized for detection and/or diagnosis of SARS-CoV-2 by FDA under an Emergency Use Authorization (EUA). This EUA will remain  in effect (meaning this test can be used) for the duration of the COVID-19 declaration under Section 564(b)(1) of the Act, 21 U.S.C.section 360bbb-3(b)(1), unless the authorization is terminated  or revoked sooner.       Influenza A by PCR NEGATIVE NEGATIVE Final   Influenza  B by PCR NEGATIVE NEGATIVE Final    Comment: (NOTE) The Xpert Xpress SARS-CoV-2/FLU/RSV plus assay is intended as an aid in the diagnosis of influenza from Nasopharyngeal swab specimens and should not be used as a sole basis for treatment. Nasal washings and aspirates are unacceptable for Xpert Xpress SARS-CoV-2/FLU/RSV testing.  Fact Sheet for Patients: EntrepreneurPulse.com.au  Fact Sheet for Healthcare Providers: IncredibleEmployment.be  This test is not yet approved or cleared by the [place] FDA and has been authorized for detection and/or diagnosis of SARS-CoV-2 by FDA under an Emergency Use Authorization (EUA). This EUA will remain in effect (meaning this test can be used) for the duration of the COVID-19 declaration under Section 564(b)(1) of the Act, 21 U.S.C. section 360bbb-3(b)(1), unless the authorization is terminated or revoked.  Performed at [facility], [address] [place],  [address]          Impression:  Dillon Schultz is a 76 y.o. 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  Patient had a creatinine of 3.2--3.6 going back to July 07, 2021 CKD since 2013-patient has creatinine up from 1.34 going back to 2013 CKD secondary to diabetes mellitus Progression of CKD now marked with episode of AKI  AKI is improving Patient creatinine is trending down Patient does require IV fluids  2)HTN  Patient blood pressure is uncontrolled Patient is not able to take anything p.o.   patient currently does not have any IV access I have requested RN/patient's team to please asked for IV access Patient may need hydralazine IV in an effort to help with his hypertension  in case he cannot take p.o.  3)Anemia of chronic disease  CBC Latest Ref Rng & Units 07/22/2021 07/21/2021 07/20/2021  WBC 4.0 - 10.5 K/uL 5.1 4.3 4.5  Hemoglobin 13.0 - 17.0 g/dL 11.9(L) 12.6(L) 13.6  Hematocrit 39.0 - 52.0 % 37.0(L) 38.7(L) 42.2  Platelets 150 - 400 K/uL 144(L) 158 181       HGb at goal (9--11) No need for Epogen  4) Proteinuria We will quantify patient's proteinuria Patient UA does show proteinuria    5) diabetes mellitus type 2 with CKD Primary team is following  6) Electrolytes-hyperkalemia  BMP Latest Ref Rng & Units 07/22/2021 07/21/2021 07/20/2021  Glucose 70 - 99 mg/dL 64(L) 82 107(H)  BUN 8 - 23 mg/dL 70(H) 102(H) 105(H)  Creatinine 0.61 - 1.24 mg/dL 2.73(H) 4.21(H) 5.14(H)  Sodium 135 - 145 mmol/L 143 140 139  Potassium 3.5 - 5.1 mmol/L 4.8 5.3(H) 5.1  Chloride 98 - 111 mmol/L 112(H) 106 102  CO2 22 - 32 mmol/L 23  22 23  Calcium 8.9 - 10.3 mg/dL 9.5 9.3 10.0     Sodium Normonatremic   Potassium Hyperkalemia Now better     7)Acid base  Co2 at goal     Plan:  AKI is improving Potassium is improving Patient does need IV access for both IV fluids and probably hydralazine IV on as needed basis to help with his hypertension until he can start taking p.o.      Sharvil Hoey s Theador Hawthorne 07/22/2021, 2:51 PM

## 2021-07-22 NOTE — Progress Notes (Signed)
Patient arrived to unit last night at shift change. A bath was provided to the patient to clean off dried feces on his lower extremities. Extreme edema noted in lower legs and feet, socks needed to be cut off. Abrasion on left lower anterior leg noted and documented on in chart.  This morning, family called and asked for update. Update provided by RN. Family asked if a bath could be provided to the patient due to condition that he arrived to the ED in (dried feces, edema, and swelling).  RN explained to family that patient has been agitated so medication was administered to help calm and relax patient. Family had no other questions at this but stated they would call back if they had more questions.  No other needs at this time.

## 2021-07-22 NOTE — Progress Notes (Signed)
Initial Nutrition Assessment  DOCUMENTATION CODES:   Not applicable  INTERVENTION:   Ensure Enlive po TID, each supplement provides 350 kcal and 20 grams of protein  MVI po daily   Dysphagia 3 diet  Pt at high refeed risk; recommend monitor potassium, magnesium and phosphorus labs daily until stable  NUTRITION DIAGNOSIS:   Inadequate oral intake related to acute illness as evidenced by per patient/family report.  GOAL:   Patient will meet greater than or equal to 90% of their needs  MONITOR:   PO intake, Supplement acceptance, Labs, Weight trends, Skin, I & O's  REASON FOR ASSESSMENT:   Malnutrition Screening Tool    ASSESSMENT:   76 y/o male with h/o dementia, CKD IV, HTN, HLD, volvulus, AV block, DM and CHF who is admitted with AKI, weakness and poor po intake.  Unable to speak with pt related to dementia. There are no documented meal intakes in chart; RD unsure if pt has been able to eat. RD will add supplements and MVI to help pt meet his estimated needs. Per chart, pt is down 14lbs(8%) since June; RD unsure how recently weight loss occurred. Per family report, pt with poor oral intake pta. RD will change pt over to a mechanical soft diet. Pt is likely at refeed risk. Pt is at high risk for malnutrition. RD will obtain nutrition related history and exam at follow up.   RD working remotely.  Medications reviewed and include: aspirin, celexa, heparin, NaCl @100ml /hr    Labs reviewed: K 4.8 wnl, BUN 70(H), creat 2.73(H)  NUTRITION - FOCUSED PHYSICAL EXAM: Unable to perform at this time   Diet Order:   Diet Order             DIET DYS 3 Room service appropriate? No; Fluid consistency: Thin  Diet effective now                  EDUCATION NEEDS:   No education needs have been identified at this time  Skin:  Skin Assessment: Reviewed RN Assessment (pre-tibial wound)  Last BM:  pta  Height:   Ht Readings from Last 1 Encounters:  07/20/21 5\' 8"  (1.727  m)    Weight:   Wt Readings from Last 1 Encounters:  07/20/21 70.9 kg    Ideal Body Weight:  70 kg  BMI:  Body mass index is 23.78 kg/m.  Estimated Nutritional Needs:   Kcal:  1800-2100kcal/day  Protein:  90-105g/day  Fluid:  1.8-2.1L/day  Koleen Distance MS, RD, LDN Please refer to [facility] for RD and/or RD on-call/weekend/after hours pager

## 2021-07-22 NOTE — Progress Notes (Signed)
PROGRESS NOTE    Dillon Schultz  LHT:[id number] DOB: 03-06-1946 DOA: 07/20/2021 PCP: [name], MD    Assessment & Plan:   Principal Problem:   AKI (acute kidney injury) ([place])   AKI on CKDIV: possibly secondary to dehydration. Cr is trending down from day prior. Avoid nephrotoxic meds. Continue on IVFs. Nephro recs apprec  Hyperkalemia: resolved    Dementia: continue w/ supportive care. Continue on home dose of depakote. Still very combative & aggressive behavior. Still hitting staff. Haldol & ativan prn   Thrombocytopenia: etiology unclear. Will continue to monitor   HTN: continue on home dose of hydralazine, amlodipine   Depression: severity unknown. Continue on home dose of citalopram   DM2: likely well controlled. Diet controlled    DVT prophylaxis: heparin  Code Status: full  Family Communication: called pt's wife, Judson Roch, no answer but I left a voicemail  Disposition Plan: depends on PT/OT recs (not consulted yet)  Level of care: Med-Surg  Status is: Inpatient  Remains inpatient appropriate because: severity of illness, still with very aggressive/combative behavior     Consultants:  Nephro   Procedures:   Antimicrobials:   Subjective: Pt is very lethargic.   Objective: Vitals:   07/21/21 2253 07/22/21 0252 07/22/21 0254 07/22/21 0728  BP: (!) 156/87 (!) 88/60 (!) 93/57 (!) 180/83  Pulse: 99 67 68 63  Resp: 16 16  19   Temp: 98.4 F (36.9 C) 97.6 F (36.4 C)    TempSrc: Oral Oral    SpO2: 92% 98%  100%  Weight:      Height:        Intake/Output Summary (Last 24 hours) at 07/22/2021 1224 Last data filed at 07/22/2021 0008 Gross per 24 hour  Intake --  Output 500 ml  Net -500 ml   Filed Weights   07/20/21 2229  Weight: 70.9 kg    Examination:  General exam: Appears lethargic  Respiratory system: decreased breath sounds  Cardiovascular system: S1/S2+. No rubs or clicks  Gastrointestinal system: Abd is soft, NT, ND & hypoactive bowel  sounds  Central nervous system: Lethargic. Moves all extremities Psychiatry: Judgement and insight appears poor     Data Reviewed: I have personally reviewed following labs and imaging studies  CBC: Recent Labs  Lab 07/20/21 2248 07/21/21 0702 07/22/21 0944  WBC 4.5 4.3 5.1  HGB 13.6 12.6* 11.9*  HCT 42.2 38.7* 37.0*  MCV 92.3 90.0 90.2  PLT 181 158 572*   Basic Metabolic Panel: Recent Labs  Lab 07/20/21 2248 07/21/21 0702 07/22/21 0944  NA 139 140 143  K 5.1 5.3* 4.8  CL 102 106 112*  CO2 23 22 23   GLUCOSE 107* 82 64*  BUN 105* 102* 70*  CREATININE 5.14* 4.21* 2.73*  CALCIUM 10.0 9.3 9.5   GFR: Estimated Creatinine Clearance: 21.9 mL/min (A) (by C-G formula based on SCr of 2.73 mg/dL (H)). Liver Function Tests: Recent Labs  Lab 07/20/21 2248  AST 21  ALT 11  ALKPHOS 81  BILITOT 0.6  PROT 8.0  ALBUMIN 4.3   No results for input(s): LIPASE, AMYLASE in the last 168 hours. No results for input(s): AMMONIA in the last 168 hours. Coagulation Profile: No results for input(s): INR, PROTIME in the last 168 hours. Cardiac Enzymes: No results for input(s): CKTOTAL, CKMB, CKMBINDEX, TROPONINI in the last 168 hours. BNP (last 3 results) No results for input(s): PROBNP in the last 8760 hours. HbA1C: No results for input(s): HGBA1C in the last 72 hours. CBG:  Recent Labs  Lab 07/20/21 2300  GLUCAP 92   Lipid Profile: No results for input(s): CHOL, HDL, LDLCALC, TRIG, CHOLHDL, LDLDIRECT in the last 72 hours. Thyroid Function Tests: No results for input(s): TSH, T4TOTAL, FREET4, T3FREE, THYROIDAB in the last 72 hours. Anemia Panel: No results for input(s): VITAMINB12, FOLATE, FERRITIN, TIBC, IRON, RETICCTPCT in the last 72 hours. Sepsis Labs: No results for input(s): PROCALCITON, LATICACIDVEN in the last 168 hours.  Recent Results (from the past 240 hour(s))  Resp Panel by RT-PCR (Flu A&B, Covid) Urine, Clean Catch     Status: None   Collection Time: 07/20/21  11:36 PM   Specimen: Urine, Clean Catch; Nasopharyngeal(NP) swabs in vial transport medium  Result Value Ref Range Status   SARS Coronavirus 2 by RT PCR NEGATIVE NEGATIVE Final    Comment: (NOTE) SARS-CoV-2 target nucleic acids are NOT DETECTED.  The SARS-CoV-2 RNA is generally detectable in upper respiratory specimens during the acute phase of infection. The lowest concentration of SARS-CoV-2 viral copies this assay can detect is 138 copies/mL. A negative result does not preclude SARS-Cov-2 infection and should not be used as the sole basis for treatment or other patient management decisions. A negative result may occur with  improper specimen collection/handling, submission of specimen other than nasopharyngeal swab, presence of viral mutation(s) within the areas targeted by this assay, and inadequate number of viral copies(<138 copies/mL). A negative result must be combined with clinical observations, patient history, and epidemiological information. The expected result is Negative.  Fact Sheet for Patients:  EntrepreneurPulse.com.au  Fact Sheet for Healthcare Providers:  IncredibleEmployment.be  This test is no t yet approved or cleared by the [place] FDA and  has been authorized for detection and/or diagnosis of SARS-CoV-2 by FDA under an Emergency Use Authorization (EUA). This EUA will remain  in effect (meaning this test can be used) for the duration of the COVID-19 declaration under Section 564(b)(1) of the Act, 21 U.S.C.section 360bbb-3(b)(1), unless the authorization is terminated  or revoked sooner.       Influenza A by PCR NEGATIVE NEGATIVE Final   Influenza B by PCR NEGATIVE NEGATIVE Final    Comment: (NOTE) The Xpert Xpress SARS-CoV-2/FLU/RSV plus assay is intended as an aid in the diagnosis of influenza from Nasopharyngeal swab specimens and should not be used as a sole basis for treatment. Nasal washings and aspirates  are unacceptable for Xpert Xpress SARS-CoV-2/FLU/RSV testing.  Fact Sheet for Patients: EntrepreneurPulse.com.au  Fact Sheet for Healthcare Providers: IncredibleEmployment.be  This test is not yet approved or cleared by the [place] FDA and has been authorized for detection and/or diagnosis of SARS-CoV-2 by FDA under an Emergency Use Authorization (EUA). This EUA will remain in effect (meaning this test can be used) for the duration of the COVID-19 declaration under Section 564(b)(1) of the Act, 21 U.S.C. section 360bbb-3(b)(1), unless the authorization is terminated or revoked.  Performed at [facility], [address], [place], [place] [address]          Radiology Studies: CT Abdomen Pelvis Wo Contrast  Result Date: 07/21/2021 CLINICAL DATA:  Nausea, vomiting, diarrhea for EXAM: CT ABDOMEN AND PELVIS WITHOUT CONTRAST TECHNIQUE: Multidetector CT imaging of the abdomen and pelvis was performed following the standard protocol without IV contrast. RADIATION DOSE REDUCTION: This exam was performed according to the departmental dose-optimization program which includes automated exposure control, adjustment of the mA and/or kV according to patient size and/or use of iterative reconstruction technique. COMPARISON:  09/05/2009 FINDINGS: Lower  chest: Heart no acute abnormality Hepatobiliary: No focal hepatic abnormality. Gallbladder unremarkable. Pancreas: No focal abnormality or ductal dilatation. Spleen: No focal abnormality.  Normal size. Adrenals/Urinary Tract: No adrenal abnormality. No focal renal abnormality. No stones or hydronephrosis. Urinary bladder is unremarkable. Stomach/Bowel: Stomach, large and small bowel grossly unremarkable. Vascular/Lymphatic: Aortic atherosclerosis. No evidence of aneurysm or adenopathy. Reproductive: No visible focal abnormality. Other: No free fluid or free air. Musculoskeletal: No acute bony abnormality.  IMPRESSION: No acute findings in the abdomen or pelvis. Aortic atherosclerosis. Electronically Signed   By: Rolm Baptise M.D.   On: 07/21/2021 00:36   CT Head Wo Contrast  Result Date: 07/21/2021 CLINICAL DATA:  Mental status changes EXAM: CT HEAD WITHOUT CONTRAST TECHNIQUE: Contiguous axial images were obtained from the base of the skull through the vertex without intravenous contrast. RADIATION DOSE REDUCTION: This exam was performed according to the departmental dose-optimization program which includes automated exposure control, adjustment of the mA and/or kV according to patient size and/or use of iterative reconstruction technique. COMPARISON:  12/22/2020 FINDINGS: Brain: There is atrophy and chronic small vessel disease changes. No acute intracranial abnormality. Specifically, no hemorrhage, hydrocephalus, mass lesion, acute infarction, or significant intracranial injury. Vascular: No hyperdense vessel or unexpected calcification. Skull: No acute calvarial abnormality. Sinuses/Orbits: No acute findings Other: None IMPRESSION: Atrophy, chronic microvascular disease. No acute intracranial abnormality. Electronically Signed   By: Rolm Baptise M.D.   On: 07/21/2021 00:38   DG Chest Port 1 View  Result Date: 07/20/2021 CLINICAL DATA:  Altered mental status EXAM: PORTABLE CHEST 1 VIEW COMPARISON:  07/07/2021 FINDINGS: The heart size and mediastinal contours are within normal limits. Both lungs are clear. The visualized skeletal structures are unremarkable. IMPRESSION: No active disease. Electronically Signed   By: Donavan Foil M.D.   On: 07/20/2021 23:41        Scheduled Meds:  amLODipine  10 mg Oral Daily   aspirin  81 mg Oral Daily   citalopram  40 mg Oral Daily   heparin  5,000 Units Subcutaneous Q8H   hydrALAZINE  25 mg Oral BID   Continuous Infusions:  sodium chloride 100 mL/hr at 07/21/21 0828   valproate sodium 125 mg (07/22/21 0206)     LOS: 1 day    Time spent: 25 mins      Wyvonnia Dusky, MD [facility] Pager [phone]  If 7PM-7AM, please contact night-coverage 07/22/2021, 12:24 PM

## 2021-07-22 NOTE — Progress Notes (Signed)
Patient became agitated and snapped off the IV line at the connector point located at the left [facility] site. Assessed site and line is in place. Unable to flush the IV or give medication as the patient will not straighten his arm. Requesting Ativan be changed from IV route to IM route.

## 2021-07-22 NOTE — Progress Notes (Signed)
Patient currently has no IV's. RN put in IV team consult due to no successful IV. IV team attempted IV x2 with no success. MD made aware. Will continue to monitor and try again later.

## 2021-07-23 DIAGNOSIS — I10 Essential (primary) hypertension: Secondary | ICD-10-CM | POA: Diagnosis not present

## 2021-07-23 DIAGNOSIS — F03918 Unspecified dementia, unspecified severity, with other behavioral disturbance: Secondary | ICD-10-CM | POA: Diagnosis not present

## 2021-07-23 DIAGNOSIS — N179 Acute kidney failure, unspecified: Secondary | ICD-10-CM | POA: Diagnosis not present

## 2021-07-23 LAB — CBC
HCT: 36.8 % — ABNORMAL LOW (ref 39.0–52.0)
Hemoglobin: 11.9 g/dL — ABNORMAL LOW (ref 13.0–17.0)
MCH: 29.5 pg (ref 26.0–34.0)
MCHC: 32.3 g/dL (ref 30.0–36.0)
MCV: 91.3 fL (ref 80.0–100.0)
Platelets: 151 10*3/uL (ref 150–400)
RBC: 4.03 MIL/uL — ABNORMAL LOW (ref 4.22–5.81)
RDW: 13.2 % (ref 11.5–15.5)
WBC: 6 10*3/uL (ref 4.0–10.5)
nRBC: 0 % (ref 0.0–0.2)

## 2021-07-23 LAB — BASIC METABOLIC PANEL
Anion gap: 9 (ref 5–15)
BUN: 59 mg/dL — ABNORMAL HIGH (ref 8–23)
CO2: 23 mmol/L (ref 22–32)
Calcium: 9.6 mg/dL (ref 8.9–10.3)
Chloride: 113 mmol/L — ABNORMAL HIGH (ref 98–111)
Creatinine, Ser: 2.22 mg/dL — ABNORMAL HIGH (ref 0.61–1.24)
GFR, Estimated: 30 mL/min — ABNORMAL LOW (ref >60)
Glucose, Bld: 75 mg/dL (ref 70–99)
Potassium: 4.6 mmol/L (ref 3.5–5.1)
Sodium: 145 mmol/L (ref 135–145)

## 2021-07-23 NOTE — Progress Notes (Signed)
OT Cancellation Note  Patient Details Name: Vahe Pienta MRN: [id number] DOB: 14-Mar-1946   Cancelled Treatment:    Reason Eval/Treat Not Completed: Other (comment). Consult received, chart reviewed. Pt slightly agitated this morning, not following commands well, RN requesting re-attempt at later time. Will re-attempt when more appropriate for therapy participation.   Ardeth Perfect., MPH, MS, OTR/L ascom [phone] 07/23/21, 10:48 AM

## 2021-07-23 NOTE — Progress Notes (Signed)
Dillon Schultz  MRN: [id number]  DOB/AGE: September 13, 1945 76 y.o.  Primary Care Physician:Aycock, Edmonia Lynch, MD  Admit date: 07/20/2021  Chief Complaint:  Chief Complaint  Patient presents with   Altered Mental Status    S-Pt presented on  07/20/2021 with  Chief Complaint  Patient presents with   Altered Mental Status    Patient is more alert than yesterday Patient neuro alert remains confused Patient does not offer any specific physical complaints Patient is not on any IV fluids as patient was uncooperative with IV line and with blood draws this morning.  I did discuss with the patient's team To please attempt IV line and blood test again.   Medications  amLODipine  10 mg Oral Daily   aspirin  81 mg Oral Daily   citalopram  40 mg Oral Daily   divalproex  125 mg Oral Q12H   feeding supplement  237 mL Oral TID BM   heparin  5,000 Units Subcutaneous Q8H   hydrALAZINE  25 mg Oral BID   multivitamin with minerals  1 tablet Oral Daily         ROS: Unable to get any data  Physical Exam: Vital signs in last 24 hours: Temp:  [97.3 F (36.3 C)-97.7 F (36.5 C)] 97.3 F (36.3 C) (01/28 1945) Pulse Rate:  [70-80] 80 (01/28 1945) Resp:  [16-19] 16 (01/28 1945) BP: (138-191)/(80-110) 138/86 (01/28 1945) SpO2:  [91 %-100 %] 91 % (01/28 1945) Weight change:  Last BM Date:  (unknown)  Intake/Output from previous day: 01/28 0701 - 01/29 0700 In: 0  Out: 350 [Urine:350] No intake/output data recorded.   Physical Exam:  General- pt is  elderly, More alert than yesterday, No apparent distress  Resp- No acute REsp distress, CTA B/L NO Rhonchi  CVS- S1S2 regular in rate and rhythm  GIT- BS+, soft, Non tender , Non distended  EXT- No LE Edema,  No Cyanosis    Lab Results:  CBC  Recent Labs    07/21/21 0702 07/22/21 0944  WBC 4.3 5.1  HGB 12.6* 11.9*  HCT 38.7* 37.0*  PLT 158 144*    BMET  Recent Labs    07/21/21 0702 07/22/21 0944  NA 140 143  K 5.3*  4.8  CL 106 112*  CO2 22 23  GLUCOSE 82 64*  BUN 102* 70*  CREATININE 4.21* 2.73*  CALCIUM 9.3 9.5      Most recent Creatinine trend  Lab Results  Component Value Date   CREATININE 2.73 (H) 07/22/2021   CREATININE 4.21 (H) 07/21/2021   CREATININE 5.14 (H) 07/20/2021      MICRO   Recent Results (from the past 240 hour(s))  Resp Panel by RT-PCR (Flu A&B, Covid) Urine, Clean Catch     Status: None   Collection Time: 07/20/21 11:36 PM   Specimen: Urine, Clean Catch; Nasopharyngeal(NP) swabs in vial transport medium  Result Value Ref Range Status   SARS Coronavirus 2 by RT PCR NEGATIVE NEGATIVE Final    Comment: (NOTE) SARS-CoV-2 target nucleic acids are NOT DETECTED.  The SARS-CoV-2 RNA is generally detectable in upper respiratory specimens during the acute phase of infection. The lowest concentration of SARS-CoV-2 viral copies this assay can detect is 138 copies/mL. A negative result does not preclude SARS-Cov-2 infection and should not be used as the sole basis for treatment or other patient management decisions. A negative result may occur with  improper specimen collection/handling, submission of specimen other than nasopharyngeal swab, presence of viral  mutation(s) within the areas targeted by this assay, and inadequate number of viral copies(<138 copies/mL). A negative result must be combined with clinical observations, patient history, and epidemiological information. The expected result is Negative.  Fact Sheet for Patients:  EntrepreneurPulse.com.au  Fact Sheet for Healthcare Providers:  IncredibleEmployment.be  This test is no t yet approved or cleared by the [place] FDA and  has been authorized for detection and/or diagnosis of SARS-CoV-2 by FDA under an Emergency Use Authorization (EUA). This EUA will remain  in effect (meaning this test can be used) for the duration of the COVID-19 declaration under Section  564(b)(1) of the Act, 21 U.S.C.section 360bbb-3(b)(1), unless the authorization is terminated  or revoked sooner.       Influenza A by PCR NEGATIVE NEGATIVE Final   Influenza B by PCR NEGATIVE NEGATIVE Final    Comment: (NOTE) The Xpert Xpress SARS-CoV-2/FLU/RSV plus assay is intended as an aid in the diagnosis of influenza from Nasopharyngeal swab specimens and should not be used as a sole basis for treatment. Nasal washings and aspirates are unacceptable for Xpert Xpress SARS-CoV-2/FLU/RSV testing.  Fact Sheet for Patients: EntrepreneurPulse.com.au  Fact Sheet for Healthcare Providers: IncredibleEmployment.be  This test is not yet approved or cleared by the [place] FDA and has been authorized for detection and/or diagnosis of SARS-CoV-2 by FDA under an Emergency Use Authorization (EUA). This EUA will remain in effect (meaning this test can be used) for the duration of the COVID-19 declaration under Section 564(b)(1) of the Act, 21 U.S.C. section 360bbb-3(b)(1), unless the authorization is terminated or revoked.  Performed at [facility], [address] [place], [place] [address]          Impression:  Dillon Schultz is a 76 y.o. 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  Patient had a creatinine of 3.2--3.6 going back to July 07, 2021 CKD since 2013-patient has creatinine up from 1.34 going back to 2013 CKD secondary to diabetes mellitus Progression of CKD now marked with episode of AKI  AKI is improving Patient creatinine is trending down Patient does require IV fluids  2)HTN  Patient blood pressure is controlled    patient currently does not have any IV access I  have requested RN/patient's team to please asked for IV access Patient may need hydralazine IV in an effort to help with his hypertension in case he cannot take p.o.  3)Anemia of chronic disease  CBC Latest Ref Rng & Units 07/22/2021 07/21/2021 07/20/2021  WBC 4.0 - 10.5 K/uL 5.1 4.3 4.5  Hemoglobin 13.0 - 17.0 g/dL 11.9(L) 12.6(L) 13.6  Hematocrit 39.0 - 52.0 % 37.0(L) 38.7(L) 42.2  Platelets 150 - 400 K/uL 144(L) 158 181       HGb at goal (9--11) No need for Epogen  4) Proteinuria We will quantify patient's proteinuria Patient UA does show proteinuria    5) diabetes mellitus type 2 with CKD Primary team is following  6) Electrolytes-hyperkalemia  BMP Latest Ref Rng & Units 07/22/2021 07/21/2021 07/20/2021  Glucose 70 - 99 mg/dL 64(L) 82 107(H)  BUN 8 - 23 mg/dL 70(H) 102(H) 105(H)  Creatinine 0.61 - 1.24 mg/dL 2.73(H) 4.21(H) 5.14(H)  Sodium 135 - 145 mmol/L 143 140  139  Potassium 3.5 - 5.1 mmol/L 4.8 5.3(H) 5.1  Chloride 98 - 111 mmol/L 112(H) 106 102  CO2 22 - 32 mmol/L 23 22 23   Calcium 8.9 - 10.3 mg/dL 9.5 9.3 10.0     Sodium Normonatremic   Potassium Hyperkalemia Now better     7)Acid base  Co2 at goal     Plan:  Awaiting this morning Chem-7 labs Patient AKI has been improving so far We will continue to follow       Leanndra Pember s [facility] 07/23/2021, 8:03 AM

## 2021-07-23 NOTE — Progress Notes (Signed)
PT Cancellation Note  Patient Details Name: Dillon Schultz MRN: [id number] DOB: 1945-11-21   Cancelled Treatment:    Reason Eval/Treat Not Completed: Other (comment)  Consult received, chart reviewed. Pt slightly agitated this morning, not following commands well, RN requesting re-attempt at later time. Will re-attempt when more appropriate for therapy participation.    Gwenlyn Saran, PT, DPT 07/23/21, 11:03 AM

## 2021-07-23 NOTE — Plan of Care (Signed)

## 2021-07-23 NOTE — Progress Notes (Addendum)
PROGRESS NOTE    Dillon Schultz  RCV:[id number] DOB: 27-Mar-1946 DOA: 07/20/2021 PCP: [name], MD    Assessment & Plan:   Principal Problem:   AKI (acute kidney injury) ([place])   AKI on CKDIV: possibly secondary to dehydration. Cr is trending down daily. Avoid nephrotoxic meds. Continue on IVFs. Nephro recs apprec  Hyperkalemia: resolved    Dementia: continue w/ supportive care. Continue on home dose of depakote. Still very combative & aggressive behavior. Haldol & ativan prn   Thrombocytopenia: etiology unclear. Will continue to monitor   HTN: continue on home dose of amlodipine, hydralazine   Depression: severity unknown. Continue on home dose of citalopram   DM2: likely well controlled. Diet controlled    DVT prophylaxis: heparin  Code Status: full  Family Communication: discussed pt's care w/ pt's son and wife and answered their questions Disposition Plan: depends on PT/OT recs   Level of care: Med-Surg  Status is: Inpatient  Remains inpatient appropriate because: severity of illness, will not cooperate with taking meds, staying in bed   Consultants:  Nephro   Procedures:   Antimicrobials:   Subjective: Pt is unable to answer questions appropriately   Objective: Vitals:   07/22/21 1259 07/22/21 1610 07/22/21 1945 07/23/21 1143  BP: (!) 191/110 (!) 156/80 138/86 (!) 152/89  Pulse: 78 70 80 65  Resp:  19 16 16   Temp: 97.7 F (36.5 C)  (!) 97.3 F (36.3 C)   TempSrc: Axillary  Oral   SpO2: 100% 100% 91% 100%  Weight:      Height:        Intake/Output Summary (Last 24 hours) at 07/23/2021 1219 Last data filed at 07/22/2021 1700 Gross per 24 hour  Intake 0 ml  Output 350 ml  Net -350 ml   Filed Weights   07/20/21 2229  Weight: 70.9 kg    Examination:  Pt refused PE  General exam:  Respiratory system:  Cardiovascular system:  Gastrointestinal system:  Central nervous system:  Psychiatry:    Data Reviewed: I have personally  reviewed following labs and imaging studies  CBC: Recent Labs  Lab 07/20/21 2248 07/21/21 0702 07/22/21 0944  WBC 4.5 4.3 5.1  HGB 13.6 12.6* 11.9*  HCT 42.2 38.7* 37.0*  MCV 92.3 90.0 90.2  PLT 181 158 102*   Basic Metabolic Panel: Recent Labs  Lab 07/20/21 2248 07/21/21 0702 07/22/21 0944  NA 139 140 143  K 5.1 5.3* 4.8  CL 102 106 112*  CO2 23 22 23   GLUCOSE 107* 82 64*  BUN 105* 102* 70*  CREATININE 5.14* 4.21* 2.73*  CALCIUM 10.0 9.3 9.5   GFR: Estimated Creatinine Clearance: 21.9 mL/min (A) (by C-G formula based on SCr of 2.73 mg/dL (H)). Liver Function Tests: Recent Labs  Lab 07/20/21 2248  AST 21  ALT 11  ALKPHOS 81  BILITOT 0.6  PROT 8.0  ALBUMIN 4.3   No results for input(s): LIPASE, AMYLASE in the last 168 hours. No results for input(s): AMMONIA in the last 168 hours. Coagulation Profile: No results for input(s): INR, PROTIME in the last 168 hours. Cardiac Enzymes: No results for input(s): CKTOTAL, CKMB, CKMBINDEX, TROPONINI in the last 168 hours. BNP (last 3 results) No results for input(s): PROBNP in the last 8760 hours. HbA1C: No results for input(s): HGBA1C in the last 72 hours. CBG: Recent Labs  Lab 07/20/21 2300  GLUCAP 92   Lipid Profile: No results for input(s): CHOL, HDL, LDLCALC, TRIG, CHOLHDL, LDLDIRECT in the  last 72 hours. Thyroid Function Tests: No results for input(s): TSH, T4TOTAL, FREET4, T3FREE, THYROIDAB in the last 72 hours. Anemia Panel: No results for input(s): VITAMINB12, FOLATE, FERRITIN, TIBC, IRON, RETICCTPCT in the last 72 hours. Sepsis Labs: No results for input(s): PROCALCITON, LATICACIDVEN in the last 168 hours.  Recent Results (from the past 240 hour(s))  Resp Panel by RT-PCR (Flu A&B, Covid) Urine, Clean Catch     Status: None   Collection Time: 07/20/21 11:36 PM   Specimen: Urine, Clean Catch; Nasopharyngeal(NP) swabs in vial transport medium  Result Value Ref Range Status   SARS Coronavirus 2 by RT  PCR NEGATIVE NEGATIVE Final    Comment: (NOTE) SARS-CoV-2 target nucleic acids are NOT DETECTED.  The SARS-CoV-2 RNA is generally detectable in upper respiratory specimens during the acute phase of infection. The lowest concentration of SARS-CoV-2 viral copies this assay can detect is 138 copies/mL. A negative result does not preclude SARS-Cov-2 infection and should not be used as the sole basis for treatment or other patient management decisions. A negative result may occur with  improper specimen collection/handling, submission of specimen other than nasopharyngeal swab, presence of viral mutation(s) within the areas targeted by this assay, and inadequate number of viral copies(<138 copies/mL). A negative result must be combined with clinical observations, patient history, and epidemiological information. The expected result is Negative.  Fact Sheet for Patients:  EntrepreneurPulse.com.au  Fact Sheet for Healthcare Providers:  IncredibleEmployment.be  This test is no t yet approved or cleared by the [place] FDA and  has been authorized for detection and/or diagnosis of SARS-CoV-2 by FDA under an Emergency Use Authorization (EUA). This EUA will remain  in effect (meaning this test can be used) for the duration of the COVID-19 declaration under Section 564(b)(1) of the Act, 21 U.S.C.section 360bbb-3(b)(1), unless the authorization is terminated  or revoked sooner.       Influenza A by PCR NEGATIVE NEGATIVE Final   Influenza B by PCR NEGATIVE NEGATIVE Final    Comment: (NOTE) The Xpert Xpress SARS-CoV-2/FLU/RSV plus assay is intended as an aid in the diagnosis of influenza from Nasopharyngeal swab specimens and should not be used as a sole basis for treatment. Nasal washings and aspirates are unacceptable for Xpert Xpress SARS-CoV-2/FLU/RSV testing.  Fact Sheet for Patients: EntrepreneurPulse.com.au  Fact Sheet for  Healthcare Providers: IncredibleEmployment.be  This test is not yet approved or cleared by the [place] FDA and has been authorized for detection and/or diagnosis of SARS-CoV-2 by FDA under an Emergency Use Authorization (EUA). This EUA will remain in effect (meaning this test can be used) for the duration of the COVID-19 declaration under Section 564(b)(1) of the Act, 21 U.S.C. section 360bbb-3(b)(1), unless the authorization is terminated or revoked.  Performed at [facility], [address], [place], [place] [address]          Radiology Studies: No results found.      Scheduled Meds:  amLODipine  10 mg Oral Daily   aspirin  81 mg Oral Daily   citalopram  40 mg Oral Daily   divalproex  125 mg Oral Q12H   feeding supplement  237 mL Oral TID BM   heparin  5,000 Units Subcutaneous Q8H   hydrALAZINE  25 mg Oral BID   multivitamin with minerals  1 tablet Oral Daily   Continuous Infusions:  sodium chloride Stopped (07/22/21 1500)     LOS: 2 days    Time spent: 20 mins  Wyvonnia Dusky, MD [facility] Pager [phone]  If 7PM-7AM, please contact night-coverage 07/23/2021, 12:19 PM

## 2021-07-24 DIAGNOSIS — N179 Acute kidney failure, unspecified: Secondary | ICD-10-CM | POA: Diagnosis not present

## 2021-07-24 DIAGNOSIS — I10 Essential (primary) hypertension: Secondary | ICD-10-CM | POA: Diagnosis not present

## 2021-07-24 DIAGNOSIS — F03918 Unspecified dementia, unspecified severity, with other behavioral disturbance: Secondary | ICD-10-CM | POA: Diagnosis not present

## 2021-07-24 LAB — CBC
HCT: 38.4 % — ABNORMAL LOW (ref 39.0–52.0)
Hemoglobin: 12.5 g/dL — ABNORMAL LOW (ref 13.0–17.0)
MCH: 29.3 pg (ref 26.0–34.0)
MCHC: 32.6 g/dL (ref 30.0–36.0)
MCV: 89.9 fL (ref 80.0–100.0)
Platelets: 159 10*3/uL (ref 150–400)
RBC: 4.27 MIL/uL (ref 4.22–5.81)
RDW: 13.6 % (ref 11.5–15.5)
WBC: 6.5 10*3/uL (ref 4.0–10.5)
nRBC: 0 % (ref 0.0–0.2)

## 2021-07-24 LAB — HEMOGLOBIN A1C
Hgb A1c MFr Bld: 6.6 % — ABNORMAL HIGH (ref 4.8–5.6)
Mean Plasma Glucose: 143 mg/dL

## 2021-07-24 LAB — BASIC METABOLIC PANEL
Anion gap: 10 (ref 5–15)
BUN: 56 mg/dL — ABNORMAL HIGH (ref 8–23)
CO2: 25 mmol/L (ref 22–32)
Calcium: 9.7 mg/dL (ref 8.9–10.3)
Chloride: 112 mmol/L — ABNORMAL HIGH (ref 98–111)
Creatinine, Ser: 2.06 mg/dL — ABNORMAL HIGH (ref 0.61–1.24)
GFR, Estimated: 33 mL/min — ABNORMAL LOW (ref >60)
Glucose, Bld: 84 mg/dL (ref 70–99)
Potassium: 4.5 mmol/L (ref 3.5–5.1)
Sodium: 147 mmol/L — ABNORMAL HIGH (ref 135–145)

## 2021-07-24 MED ORDER — ZIPRASIDONE MESYLATE 20 MG IM SOLR
10.0000 mg | Freq: Two times a day (BID) | INTRAMUSCULAR | Status: DC | PRN
  Administered 2021-07-24: 10 mg via INTRAMUSCULAR
  Filled 2021-07-24 (×3): qty 20

## 2021-07-24 NOTE — Evaluation (Signed)
Occupational Therapy Evaluation Patient Details Name: Dillon Schultz MRN: [id number] DOB: 1946/04/30 Today's Date: 07/24/2021   History of Present Illness Pt is a 76 y/o F admitted on 07/20/21 after presenting to the ED with c/c of AMS, generalized weakness & decreased oral intake along with N&V & diarrhea. Pt is being treated for AKI. PMH: HTN, DM2, dementia with behavioral changes, dyslipidemia, chronic diastolic CHF, gout, chronic back pain, lumbar herniated disc   Clinical Impression   Pt seen for OT evaluation with co-tx with PT. Pt received in bed, covers pulled over his head, but does begin to engage with therapists.  Pt declines mobility but found to be lying in urine soaked bed with pt appearing unaware. Pt requires max assist +2 for supine<>sit as pt does not engage in movement. Pt noted with heavy posterior lean and not following commands well and becoming increasingly agitated with therapists and nurse tech. Pt declines standing to allow staff to change sheets & requires max assist +2 for rolling L<>R to linens change. Pt very limited by impaired cognition & decreased motivation to participate. Will see pt for OT trial to see if pt becomes more engaged in therapy sessions. Pt will require 24 hr supervision/assistance at d/c.    Recommendations for follow up therapy are one component of a multi-disciplinary discharge planning process, led by the attending physician.  Recommendations may be updated based on patient status, additional functional criteria and insurance authorization.   Follow Up Recommendations  Skilled nursing-short term rehab (<3 hours/day)    Assistance Recommended at Discharge Frequent or constant Supervision/Assistance  Patient can return home with the following Two people to help with bathing/dressing/bathroom;Assistance with cooking/housework;Assistance with feeding;Help with stairs or ramp for entrance;Assist for transportation;Direct supervision/assist for financial  management;Direct supervision/assist for medications management;Two people to help with walking and/or transfers    Functional Status Assessment  Patient has had a recent decline in their functional status and demonstrates the ability to make significant improvements in function in a reasonable and predictable amount of time.  Equipment Recommendations  Other (comment) (defer to next venue, anticipate will benefit from [facility])    Recommendations for Other Services       Precautions / Restrictions Precautions Precautions: Fall Restrictions Weight Bearing Restrictions: No      Mobility Bed Mobility Overal bed mobility: Needs Assistance Bed Mobility: Supine to Sit, Sit to Supine     Supine to sit: Max assist, +2 for physical assistance, HOB elevated, +2 for safety/equipment Sit to supine: Max assist, +2 for physical assistance, HOB elevated, +2 for safety/equipment        Transfers                   General transfer comment: unsafe to attempt and pt refusing      Balance Overall balance assessment: Needs assistance Sitting-balance support: Feet supported, Bilateral upper extremity supported Sitting balance-Leahy Scale: Poor Sitting balance - Comments: Pt choosing to lean posteriorly, doesn't follow commands or engage in anterior weight shift                                   ADL either performed or assessed with clinical judgement   ADL Overall ADL's : Needs assistance/impaired  General ADL Comments: Pt currently requiring MAX A +2 for all ADL tasks at bed level 2/2 impaired cognition, agitation.     Vision         Perception     Praxis      Pertinent Vitals/Pain Pain Assessment Pain Assessment: Faces Faces Pain Scale: Hurts little more Pain Location: R foot, toes (reports his foot is broken but unable to state which one or what happened) Pain Descriptors / Indicators: Discomfort,  Guarding, Grimacing Pain Intervention(s): Limited activity within patient's tolerance, Monitored during session, Repositioned     Hand Dominance     Extremity/Trunk Assessment Upper Extremity Assessment Upper Extremity Assessment: Generalized weakness;Difficult to assess due to impaired cognition   Lower Extremity Assessment Lower Extremity Assessment: Generalized weakness;Difficult to assess due to impaired cognition       Communication Communication Communication: No difficulties   Cognition Arousal/Alertness: Lethargic Behavior During Therapy: Agitated Overall Cognitive Status: No family/caregiver present to determine baseline cognitive functioning                                 General Comments: AxO to self only, doesn't follow simple commands consistently, keeps eyes closed throughout majority of session, becomes easily agitated & grabbing/squeezing therapists hands     General Comments       Exercises     Shoulder Instructions      Home Living Family/patient expects to be discharged to:: Private residence Living Arrangements: Spouse/significant other;Other relatives Available Help at Discharge: Family   Home Access: Stairs to enter Entrance Stairs-Number of Steps: 2   Home Layout: One level               Home Equipment:  (unsure)   Additional Comments: unable to verify      Prior Functioning/Environment Prior Level of Function : Patient poor historian/Family not available             Mobility Comments: no family present to report          OT Problem List: Decreased strength;Pain;Decreased cognition;Decreased safety awareness;Decreased activity tolerance;Impaired balance (sitting and/or standing);Decreased knowledge of use of DME or AE      OT Treatment/Interventions: Self-care/ADL training;Therapeutic exercise;Therapeutic activities;Cognitive remediation/compensation;DME and/or AE instruction;Balance training;Patient/family  education    OT Goals(Current goals can be found in the care plan section) Acute Rehab OT Goals Patient Stated Goal: pt wants to be left alone OT Goal Formulation: With patient Time For Goal Achievement: 08/07/21 Potential to Achieve Goals: Fair ADL Goals Pt Will Perform Grooming: sitting;with supervision;with set-up Pt Will Perform Lower Body Dressing: with min assist;sit to/from stand Pt Will Transfer to Toilet: with mod assist;bedside commode;stand pivot transfer Pt Will Perform Toileting - Clothing Manipulation and hygiene: with min guard assist;sitting/lateral leans;sit to/from stand  OT Frequency: Min 2X/week    Co-evaluation PT/OT/SLP Co-Evaluation/Treatment: Yes Reason for Co-Treatment: Complexity of the patient's impairments (multi-system involvement);Necessary to address cognition/behavior during functional activity;For patient/therapist safety PT goals addressed during session: Mobility/safety with mobility;Balance OT goals addressed during session: ADL's and self-care      AM-PAC OT "6 Clicks" Daily Activity     Outcome Measure Help from another person eating meals?: A Lot Help from another person taking care of personal grooming?: A Lot Help from another person toileting, which includes using toliet, bedpan, or urinal?: Total Help from another person bathing (including washing, rinsing, drying)?: Total Help from another person to put on and taking off  regular upper body clothing?: Total Help from another person to put on and taking off regular lower body clothing?: Total 6 Click Score: 8   End of Session    Activity Tolerance: Treatment limited secondary to agitation Patient left: in bed;with call bell/phone within reach;with bed alarm set;with nursing/sitter in room  OT Visit Diagnosis: Other abnormalities of gait and mobility (R26.89);Other symptoms and signs involving cognitive function;Pain Pain - Right/Left:  (unclear) Pain - part of body: Ankle and joints of  foot                Time: 6384-6659 OT Time Calculation (min): 14 min Charges:  OT General Charges $OT Visit: 1 Visit OT Evaluation $OT Eval Moderate Complexity: 1 Mod  Ardeth Perfect., MPH, MS, OTR/L ascom [phone] 07/24/21, 1:48 PM

## 2021-07-24 NOTE — Progress Notes (Signed)
Patient continues to be aggressive towards staff members, attempting to hit and kick them. Patient not following commands regarding safety and attempting to exit bed. Patient has very unsteady gait and very poor safety awareness. Despite attempts to verbally de-escalate behaviors, patient continues to be aggressive. Bilat mitts placed. PRN IM haldol given per order. MD and psych made aware.

## 2021-07-24 NOTE — Progress Notes (Signed)
Patient aggressive towards sitter in room. Belligerent. Able to re-direct, less easily, and calm down. Psych MD made aware of behaviors on rounds.

## 2021-07-24 NOTE — Progress Notes (Signed)
[facility]  ROUNDING NOTE   Subjective:  Patient seen and evaluated at bedside. Was agitated late yesterday. Did receive Haldol earlier. Renal function slowly improving as creatinine down to 2.06 today.  Objective:  Vital signs in last 24 hours:  Temp:  [97.8 F (36.6 C)-98 F (36.7 C)] 97.8 F (36.6 C) (01/30 1214) Pulse Rate:  [62-88] 64 (01/30 1214) Resp:  [12-18] 13 (01/30 1214) BP: (131-178)/(88-98) 178/93 (01/30 1214) SpO2:  [99 %-100 %] 100 % (01/30 1214)  Weight change:  Filed Weights   07/20/21 2229  Weight: 70.9 kg    Intake/Output: No intake/output data recorded.   Intake/Output this shift:  No intake/output data recorded.  Physical Exam: General: No acute distress  Head: Normocephalic, atraumatic. Moist oral mucosal membranes  Eyes: Anicteric  Neck: Supple  Lungs:  Clear to auscultation, normal effort  Heart: S1S2 no rubs  Abdomen:  Soft, nontender, bowel sounds present  Extremities: No peripheral edema.  Neurologic: Awake, but confused  Skin: No acute rash  Access: No hemodialysis access    Basic Metabolic Panel: Recent Labs  Lab 07/20/21 2248 07/21/21 0702 07/22/21 0944 07/23/21 1403 07/24/21 0907  NA 139 140 143 145 147*  K 5.1 5.3* 4.8 4.6 4.5  CL 102 106 112* 113* 112*  CO2 23 22 23 23 25   GLUCOSE 107* 82 64* 75 84  BUN 105* 102* 70* 59* 56*  CREATININE 5.14* 4.21* 2.73* 2.22* 2.06*  CALCIUM 10.0 9.3 9.5 9.6 9.7    Liver Function Tests: Recent Labs  Lab 07/20/21 2248  AST 21  ALT 11  ALKPHOS 81  BILITOT 0.6  PROT 8.0  ALBUMIN 4.3   No results for input(s): LIPASE, AMYLASE in the last 168 hours. No results for input(s): AMMONIA in the last 168 hours.  CBC: Recent Labs  Lab 07/20/21 2248 07/21/21 0702 07/22/21 0944 07/23/21 1403 07/24/21 0907  WBC 4.5 4.3 5.1 6.0 6.5  HGB 13.6 12.6* 11.9* 11.9* 12.5*  HCT 42.2 38.7* 37.0* 36.8* 38.4*  MCV 92.3 90.0 90.2 91.3 89.9  PLT 181 158 144* 151 159     Cardiac Enzymes: No results for input(s): CKTOTAL, CKMB, CKMBINDEX, TROPONINI in the last 168 hours.  BNP: Invalid input(s): POCBNP  CBG: Recent Labs  Lab 07/20/21 2300  GLUCAP 92    Microbiology: Results for orders placed or performed during the hospital encounter of 07/20/21  Resp Panel by RT-PCR (Flu A&B, Covid) Urine, Clean Catch     Status: None   Collection Time: 07/20/21 11:36 PM   Specimen: Urine, Clean Catch; Nasopharyngeal(NP) swabs in vial transport medium  Result Value Ref Range Status   SARS Coronavirus 2 by RT PCR NEGATIVE NEGATIVE Final    Comment: (NOTE) SARS-CoV-2 target nucleic acids are NOT DETECTED.  The SARS-CoV-2 RNA is generally detectable in upper respiratory specimens during the acute phase of infection. The lowest concentration of SARS-CoV-2 viral copies this assay can detect is 138 copies/mL. A negative result does not preclude SARS-Cov-2 infection and should not be used as the sole basis for treatment or other patient management decisions. A negative result may occur with  improper specimen collection/handling, submission of specimen other than nasopharyngeal swab, presence of viral mutation(s) within the areas targeted by this assay, and inadequate number of viral copies(<138 copies/mL). A negative result must be combined with clinical observations, patient history, and epidemiological information. The expected result is Negative.  Fact Sheet for Patients:  EntrepreneurPulse.com.au  Fact Sheet for Healthcare Providers:  IncredibleEmployment.be  This test is no t yet approved or cleared by the [place] and  has been authorized for detection and/or diagnosis of SARS-CoV-2 by FDA under an Emergency Use Authorization (EUA). This EUA will remain  in effect (meaning this test can be used) for the duration of the COVID-19 declaration under Section 564(b)(1) of the Act, 21 U.S.C.section  360bbb-3(b)(1), unless the authorization is terminated  or revoked sooner.       Influenza A by PCR NEGATIVE NEGATIVE Final   Influenza B by PCR NEGATIVE NEGATIVE Final    Comment: (NOTE) The Xpert Xpress SARS-CoV-2/FLU/RSV plus assay is intended as an aid in the diagnosis of influenza from Nasopharyngeal swab specimens and should not be used as a sole basis for treatment. Nasal washings and aspirates are unacceptable for Xpert Xpress SARS-CoV-2/FLU/RSV testing.  Fact Sheet for Patients: EntrepreneurPulse.com.au  Fact Sheet for Healthcare Providers: IncredibleEmployment.be  This test is not yet approved or cleared by the [place] FDA and has been authorized for detection and/or diagnosis of SARS-CoV-2 by FDA under an Emergency Use Authorization (EUA). This EUA will remain in effect (meaning this test can be used) for the duration of the COVID-19 declaration under Section 564(b)(1) of the Act, 21 U.S.C. section 360bbb-3(b)(1), unless the authorization is terminated or revoked.  Performed at [facility], [address] [place], [place] [address]     Coagulation Studies: No results for input(s): LABPROT, INR in the last 72 hours.  Urinalysis: No results for input(s): COLORURINE, LABSPEC, PHURINE, GLUCOSEU, HGBUR, BILIRUBINUR, KETONESUR, PROTEINUR, UROBILINOGEN, NITRITE, LEUKOCYTESUR in the last 72 hours.  Invalid input(s): APPERANCEUR    Imaging: No results found.   Medications:    sodium chloride Stopped (07/22/21 1500)    amLODipine  10 mg Oral Daily   aspirin  81 mg Oral Daily   citalopram  40 mg Oral Daily   divalproex  125 mg Oral Q12H   feeding supplement  237 mL Oral TID BM   heparin  5,000 Units Subcutaneous Q8H   hydrALAZINE  25 mg Oral BID   multivitamin with minerals  1 tablet Oral Daily   acetaminophen **OR** acetaminophen, haloperidol lactate, hydrALAZINE, LORazepam, magnesium hydroxide,  ondansetron **OR** ondansetron (ZOFRAN) IV, traZODone  Assessment/ Plan:  76 y.o. male with past medical history of hypertension, diabetes mellitus type 2, dementia with behavioral changes, hyperlipidemia, chronic diastolic heart failure, gout, chronic back pain who presented with altered mental status, decreased p.o. intake, nausea, vomiting, and acute kidney injury.  1.  Acute kidney injury/chronic kidney disease stage IV baseline creatinine 3.2.  Suspect acute kidney injury due to nausea, vomiting, poor p.o. intake.  Patient does not have an IV in place due to agitation.  We encourage patient to maintain adequate fluid hydration status.  Creatinine is coming down slowly.  2.  Anemia of chronic kidney disease.  Hemoglobin 12.5.  No immediate need for Epogen.  3.  Hypertension.  Maintain the patient on amlodipine and hydralazine for now.   LOS: 3 Rigby Leonhardt 1/30/20232:57 PM

## 2021-07-24 NOTE — Evaluation (Signed)
Physical Therapy Evaluation Patient Details Name: Dillon Schultz MRN: [id number] DOB: 05-07-1946 Today's Date: 07/24/2021  History of Present Illness  Pt is a 76 y/o F admitted on 07/20/21 after presenting to the ED with c/c of AMS, generalized weakness & decreased oral intake along with N&V & diarrhea. Pt is being treated for AKI. PMH: HTN, DM2, dementia with behavioral changes, dyslipidemia, chronic diastolic CHF, gout, chronic back pain, lumbar herniated disc    Clinical Impression  Pt seen for PT evaluation with co-tx with OT. Pt received in bed with covers pulled over face but engaging in conversation with PT. Pt declines mobility but found to be lying in urine soaked bed with pt appearing unaware. Pt requires max assist +2 for supine<>sit as pt does not engage in movement. Pt declines standing to allow staff to change sheets & requires max assist +2 for rolling L<>R to allow PT to change sheets. Pt very limited by impaired cognition & decreased motivation to participate. Will see pt for PT trial to see if pt becomes more engaged in therapy sessions. Pt will require 24 hr supervision/assistance at d/c.      Recommendations for follow up therapy are one component of a multi-disciplinary discharge planning process, led by the attending physician.  Recommendations may be updated based on patient status, additional functional criteria and insurance authorization.  Follow Up Recommendations Skilled nursing-short term rehab (<3 hours/day)    Assistance Recommended at Discharge Frequent or constant Supervision/Assistance  Patient can return home with the following  Two people to help with walking and/or transfers;Two people to help with bathing/dressing/bathroom;Direct supervision/assist for medications management;Assistance with feeding;Assist for transportation;Help with stairs or ramp for entrance;Assistance with cooking/housework;Direct supervision/assist for financial management    Equipment  Recommendations None recommended by PT  Recommendations for Other Services       Functional Status Assessment Patient has had a recent decline in their functional status and demonstrates the ability to make significant improvements in function in a reasonable and predictable amount of time.     Precautions / Restrictions Precautions Precautions: Fall Restrictions Weight Bearing Restrictions: No      Mobility  Bed Mobility Overal bed mobility: Needs Assistance Bed Mobility: Supine to Sit, Sit to Supine     Supine to sit: Max assist, +2 for physical assistance, HOB elevated, +2 for safety/equipment Sit to supine: Max assist, +2 for physical assistance, HOB elevated, +2 for safety/equipment        Transfers                        Ambulation/Gait                  Stairs            Wheelchair Mobility    Modified Rankin (Stroke Patients Only)       Balance Overall balance assessment: Needs assistance Sitting-balance support: Feet supported, Bilateral upper extremity supported Sitting balance-Leahy Scale: Poor Sitting balance - Comments: Pt choosing to lean posteriorly, doesn't follow commands or engage in anterior weight shift                                     Pertinent Vitals/Pain Pain Assessment Pain Assessment: Faces Faces Pain Scale: Hurts little more Pain Location: R foot, toes (reports his foot is broken but unable to state which one or what happened) Pain Descriptors / Indicators:  Discomfort Pain Intervention(s): Repositioned, Monitored during session    [place] expects to be discharged to:: Private residence Living Arrangements: Spouse/significant other;Other relatives Available Help at Discharge: Family   Home Access: Stairs to enter   Entrance Stairs-Number of Steps: 2   Home Layout: One level Home Equipment:  (unsure)      Prior Function Prior Level of Function : Patient poor  historian/Family not available             Mobility Comments: no family present to report       Hand Dominance        Extremity/Trunk Assessment   Upper Extremity Assessment Upper Extremity Assessment: Generalized weakness;Difficult to assess due to impaired cognition    Lower Extremity Assessment Lower Extremity Assessment: Generalized weakness;Difficult to assess due to impaired cognition       Communication   Communication: No difficulties  Cognition Arousal/Alertness: Lethargic Behavior During Therapy: Agitated Overall Cognitive Status: No family/caregiver present to determine baseline cognitive functioning                                 General Comments: AxO to self only, doesn't follow simple commands consistently, keeps eyes closed throughout majority of session, becomes easily agitated & grabbing/squeezing therapists hands        General Comments      Exercises     Assessment/Plan    PT Assessment Patient needs continued PT services (will see pt for trial PT)  PT Problem List Decreased strength;Decreased mobility;Decreased safety awareness;Decreased range of motion;Decreased coordination;Decreased activity tolerance;Decreased balance;Decreased knowledge of use of DME;Pain;Decreased cognition;Cardiopulmonary status limiting activity       PT Treatment Interventions DME instruction;Therapeutic exercise;Gait training;Balance training;Neuromuscular re-education;Stair training;Functional mobility training;Therapeutic activities;Patient/family education;Manual techniques    PT Goals (Current goals can be found in the Care Plan section)  Acute Rehab PT Goals PT Goal Formulation: Patient unable to participate in goal setting Time For Goal Achievement: 08/07/21 Potential to Achieve Goals: Fair    Frequency Min 2X/week     Co-evaluation PT/OT/SLP Co-Evaluation/Treatment: Yes Reason for Co-Treatment: Necessary to address cognition/behavior  during functional activity;Complexity of the patient's impairments (multi-system involvement);For patient/therapist safety PT goals addressed during session: Mobility/safety with mobility;Balance         AM-PAC PT "6 Clicks" Mobility  Outcome Measure Help needed turning from your back to your side while in a flat bed without using bedrails?: Total Help needed moving from lying on your back to sitting on the side of a flat bed without using bedrails?: Total Help needed moving to and from a bed to a chair (including a wheelchair)?: Total Help needed standing up from a chair using your arms (e.g., wheelchair or bedside chair)?: Total Help needed to walk in hospital room?: Total Help needed climbing 3-5 steps with a railing? : Total 6 Click Score: 6    End of Session   Activity Tolerance: Treatment limited secondary to agitation Patient left: in bed;with nursing/sitter in room   PT Visit Diagnosis: Difficulty in walking, not elsewhere classified (R26.2);Muscle weakness (generalized) (M62.81)    Time: 7026-3785 PT Time Calculation (min) (ACUTE ONLY): 13 min   Charges:   PT Evaluation $PT Eval High Complexity: 1 High          Lavone Nian, PT, DPT 07/24/21, 12:32 PM   Waunita Schooner 07/24/2021, 12:30 PM

## 2021-07-24 NOTE — Progress Notes (Signed)
PROGRESS NOTE    Dillon Schultz  OAC:[id number] DOB: [birth year] DOA: 07/20/2021 PCP: [name], MD    Assessment & Plan:   Principal Problem:   AKI (acute kidney injury) ([place])   AKI on CKDIV: possibly secondary to dehydration. Cr is trending down again today. Avoid nephrotoxic meds. Continue on IVFs. Nephro recs apprec  Hyperkalemia: resolved    Dementia: continue w/ supportive. Continue on home dose of depakote. Haldol, ativan prn   Thrombocytopenia: resolved    HTN: continue on home dose of amlodipine, hydralazine   Depression: severity unknown. Continue on home dose of citalopram   DM2: likely well controlled. Diet controlled    DVT prophylaxis: heparin  Code Status: full  Family Communication: discussed pt's care w/ pt's son and wife and answered their questions Disposition Plan: PT recs SNF   Level of care: Med-Surg  Status is: Inpatient  Remains inpatient appropriate because: severity of illness,  Consultants:  Nephro   Procedures:   Antimicrobials:   Subjective: Pt is very lethargic   Objective: Vitals:   07/22/21 1610 07/22/21 1945 07/23/21 1143 07/24/21 0604  BP: (!) 156/80 138/86 (!) 152/89 131/88  Pulse: 70 80 65 88  Resp: 19 16 16 18   Temp:  (!) 97.3 F (36.3 C)  98 F (36.7 C)  TempSrc:  Oral  Oral  SpO2: 100% 91% 100% 99%  Weight:      Height:       No intake or output data in the 24 hours ending 07/24/21 0727  Filed Weights   07/20/21 2229  Weight: 70.9 kg    Examination  General exam: Lethargic. Disheveled  Respiratory system: decreased breath sounds b/l  Cardiovascular system: S1/S2+. No rubs or clicks  Gastrointestinal system: Soft, NT, ND & hypoactive bowel sounds Central nervous system: Lethargic. Moves all extremities  Psychiatry: judgement and insight appears poor    Data Reviewed: I have personally reviewed following labs and imaging studies  CBC: Recent Labs  Lab 07/20/21 2248 07/21/21 0702  07/22/21 0944 07/23/21 1403  WBC 4.5 4.3 5.1 6.0  HGB 13.6 12.6* 11.9* 11.9*  HCT 42.2 38.7* 37.0* 36.8*  MCV 92.3 90.0 90.2 91.3  PLT 181 158 144* 010   Basic Metabolic Panel: Recent Labs  Lab 07/20/21 2248 07/21/21 0702 07/22/21 0944 07/23/21 1403  NA 139 140 143 145  K 5.1 5.3* 4.8 4.6  CL 102 106 112* 113*  CO2 23 22 23 23   GLUCOSE 107* 82 64* 75  BUN 105* 102* 70* 59*  CREATININE 5.14* 4.21* 2.73* 2.22*  CALCIUM 10.0 9.3 9.5 9.6   GFR: Estimated Creatinine Clearance: 27 mL/min (A) (by C-G formula based on SCr of 2.22 mg/dL (H)). Liver Function Tests: Recent Labs  Lab 07/20/21 2248  AST 21  ALT 11  ALKPHOS 81  BILITOT 0.6  PROT 8.0  ALBUMIN 4.3   No results for input(s): LIPASE, AMYLASE in the last 168 hours. No results for input(s): AMMONIA in the last 168 hours. Coagulation Profile: No results for input(s): INR, PROTIME in the last 168 hours. Cardiac Enzymes: No results for input(s): CKTOTAL, CKMB, CKMBINDEX, TROPONINI in the last 168 hours. BNP (last 3 results) No results for input(s): PROBNP in the last 8760 hours. HbA1C: No results for input(s): HGBA1C in the last 72 hours. CBG: Recent Labs  Lab 07/20/21 2300  GLUCAP 92   Lipid Profile: No results for input(s): CHOL, HDL, LDLCALC, TRIG, CHOLHDL, LDLDIRECT in the last 72 hours. Thyroid Function Tests: No  results for input(s): TSH, T4TOTAL, FREET4, T3FREE, THYROIDAB in the last 72 hours. Anemia Panel: No results for input(s): VITAMINB12, FOLATE, FERRITIN, TIBC, IRON, RETICCTPCT in the last 72 hours. Sepsis Labs: No results for input(s): PROCALCITON, LATICACIDVEN in the last 168 hours.  Recent Results (from the past 240 hour(s))  Resp Panel by RT-PCR (Flu A&B, Covid) Urine, Clean Catch     Status: None   Collection Time: 07/20/21 11:36 PM   Specimen: Urine, Clean Catch; Nasopharyngeal(NP) swabs in vial transport medium  Result Value Ref Range Status   SARS Coronavirus 2 by RT PCR NEGATIVE  NEGATIVE Final    Comment: (NOTE) SARS-CoV-2 target nucleic acids are NOT DETECTED.  The SARS-CoV-2 RNA is generally detectable in upper respiratory specimens during the acute phase of infection. The lowest concentration of SARS-CoV-2 viral copies this assay can detect is 138 copies/mL. A negative result does not preclude SARS-Cov-2 infection and should not be used as the sole basis for treatment or other patient management decisions. A negative result may occur with  improper specimen collection/handling, submission of specimen other than nasopharyngeal swab, presence of viral mutation(s) within the areas targeted by this assay, and inadequate number of viral copies(<138 copies/mL). A negative result must be combined with clinical observations, patient history, and epidemiological information. The expected result is Negative.  Fact Sheet for Patients:  EntrepreneurPulse.com.au  Fact Sheet for Healthcare Providers:  IncredibleEmployment.be  This test is no t yet approved or cleared by the [place] FDA and  has been authorized for detection and/or diagnosis of SARS-CoV-2 by FDA under an Emergency Use Authorization (EUA). This EUA will remain  in effect (meaning this test can be used) for the duration of the COVID-19 declaration under Section 564(b)(1) of the Act, 21 U.S.C.section 360bbb-3(b)(1), unless the authorization is terminated  or revoked sooner.       Influenza A by PCR NEGATIVE NEGATIVE Final   Influenza B by PCR NEGATIVE NEGATIVE Final    Comment: (NOTE) The Xpert Xpress SARS-CoV-2/FLU/RSV plus assay is intended as an aid in the diagnosis of influenza from Nasopharyngeal swab specimens and should not be used as a sole basis for treatment. Nasal washings and aspirates are unacceptable for Xpert Xpress SARS-CoV-2/FLU/RSV testing.  Fact Sheet for Patients: EntrepreneurPulse.com.au  Fact Sheet for Healthcare  Providers: IncredibleEmployment.be  This test is not yet approved or cleared by the [place] FDA and has been authorized for detection and/or diagnosis of SARS-CoV-2 by FDA under an Emergency Use Authorization (EUA). This EUA will remain in effect (meaning this test can be used) for the duration of the COVID-19 declaration under Section 564(b)(1) of the Act, 21 U.S.C. section 360bbb-3(b)(1), unless the authorization is terminated or revoked.  Performed at [facility], [address], [place], [place] [address]          Radiology Studies: No results found.      Scheduled Meds:  amLODipine  10 mg Oral Daily   aspirin  81 mg Oral Daily   citalopram  40 mg Oral Daily   divalproex  125 mg Oral Q12H   feeding supplement  237 mL Oral TID BM   heparin  5,000 Units Subcutaneous Q8H   hydrALAZINE  25 mg Oral BID   multivitamin with minerals  1 tablet Oral Daily   Continuous Infusions:  sodium chloride Stopped (07/22/21 1500)     LOS: 3 days    Time spent: 15 mins     Wyvonnia Dusky, MD [facility] Pager  [phone]  If 7PM-7AM, please contact night-coverage 07/24/2021, 7:27 AM

## 2021-07-24 NOTE — Progress Notes (Signed)
Thus far during shift, patient has not had any aggressive behaviors toward staff. He has been easily re-directed when agitation occurs. However, when talking about his wife patient states he will "just shoot her." Patient further elaborates that he is "fine" with having to "go to jail." MD and psych notified.

## 2021-07-24 NOTE — Progress Notes (Signed)
OT Cancellation Note  Patient Details Name: Dillon Schultz MRN: [id number] DOB: 1945/07/18   Cancelled Treatment:    Reason Eval/Treat Not Completed: Fatigue/lethargy limiting ability to participate. Consult received, chart reviewed. On 1st attempt, pt sleeping soundly. 2nd attempt, pt still won't wake despite positioning changes and sternal rubs. Briefly shakes head when asked if he was hungry but does not open eyes or engage further. Sitter in room also endorses attempts to wake him were unsuccessful. Will re-attempt at later date/time as pt is able to participate.   Ardeth Perfect., MPH, MS, OTR/L ascom [phone] 07/24/21, 9:27 AM

## 2021-07-24 NOTE — Progress Notes (Signed)
Patient very lethargic this AM. Noted to have received haldol early this morning. VSS, breathing adequate and unlabored. Patient responsive to voice and pain, although does continue sleeping. Will continue to monitor.

## 2021-07-24 NOTE — TOC Initial Note (Addendum)
Transition of Care [facility]) - Initial/Assessment Note    Patient Details  Name: Dillon Schultz MRN: [id number] Date of Birth: 24-Jun-1946  Transition of Care [facility]) CM/SW Contact:    Pete Pelt, RN Phone Number: 07/24/2021, 9:15 AM  Clinical Narrative:    Spoke with patient's son due to cognitive status.  Son states that he and patient's spouse care for patient at home, and do not usually have challenges seen here at the hospital.  He states that sometimes his father is hesitant to take medications or go to appointments, but he and spouse speak to patient, and can reason enough to allow patient to permit care.  Son states that patient is up to date with appointments, and he is able to utilize virtual and in-person appointments as appropriate.  Patient's son denies any concerns with obtaining medications, and other than occasional resistance, no concerns with patient taking medications as directed.  Son states that only he and patient's spouse are able to care for patient, he asks for home health.  This request was sent to care tea.  RNCM provided resources including Museum/gallery curator, Alzheimer's Association, including 24/7 helpline and [facility] [place] as resources for patient and spouse to assist with at home care.  TOC contact information provided to patient's son.  TOC will follow for needs.             Addendum:  0930:  RNCM also informed son that he can contact patient's insurance company to ask for resources as well.  Son verbalized understanding of all resources, was gracious and accepting and stated he planned to reach out to all for assistance.  Expected Discharge Plan: Home/Self Care Barriers to Discharge: Continued Medical Work up   Patient Goals and CMS Choice        Expected Discharge Plan and Services Expected Discharge Plan: Home/Self Care   Discharge Planning Services: CM Consult   Living arrangements for the past 2 months: Single Family Home                                       Prior Living Arrangements/Services Living arrangements for the past 2 months: Single Family Home Lives with:: Self, Significant Other Patient language and need for interpreter reviewed::  (Spoke with family due to patient cognitive status) Do you feel safe going back to the place where you live?: Yes      Need for Family Participation in Patient Care: Yes (Comment) Care giver support system in place?: Yes (comment)   Criminal Activity/Legal Involvement Pertinent to Current Situation/Hospitalization: No - Comment as needed  Activities of Daily Living Home Assistive Devices/Equipment: None ADL Screening (condition at time of admission) Patient's cognitive ability adequate to safely complete daily activities?: No Is the patient deaf or have difficulty hearing?: No Does the patient have difficulty seeing, even when wearing glasses/contacts?: No Does the patient have difficulty concentrating, remembering, or making decisions?: Yes Patient able to express need for assistance with ADLs?: Yes Does the patient have difficulty dressing or bathing?: Yes Independently performs ADLs?: No Communication: Independent Dressing (OT): Needs assistance Is this a change from baseline?: Change from baseline, expected to last <3days Grooming: Needs assistance Is this a change from baseline?: Change from baseline, expected to last <3 days Feeding: Independent Is this a change from baseline?: Change from baseline, expected to last <3 days Bathing: Needs assistance Is this a change from  baseline?: Change from baseline, expected to last <3 days Toileting: Needs assistance Is this a change from baseline?: Change from baseline, expected to last <3 days In/Out Bed: Needs assistance Is this a change from baseline?: Change from baseline, expected to last <3 days Walks in Home: Needs assistance Is this a change from baseline?: Change from baseline, expected to last <3 days Does  the patient have difficulty walking or climbing stairs?: Yes Weakness of Legs: Both Weakness of Arms/Hands: None  Permission Sought/Granted Permission sought to share information with : Case Manager Permission granted to share information with : Yes, Verbal Permission Granted              Emotional Assessment Appearance:: Appears stated age Attitude/Demeanor/Rapport:  (spoke with family due to patient cognitive status) Affect (typically observed):  (Spoke with family due to cognitive status)   Alcohol / Substance Use: Not Applicable Psych Involvement: No (comment)  Admission diagnosis:  Dehydration [E86.0] AKI (acute kidney injury) ([place]) [N17.9] Acute renal failure, unspecified acute renal failure type ([place]) [N17.9] Altered mental status, unspecified altered mental status type [R41.82] Patient Active Problem List   Diagnosis Date Noted   AKI (acute kidney injury) ([place]) 07/21/2021   Altered mental status    Delirium    Syncope and collapse 07/07/2021   Acute kidney injury ([place]) 07/07/2021   Dementia with behavioral disturbance 12/22/2020   Hypertension    Special screening for malignant neoplasms, colon    Benign neoplasm of cecum    Benign neoplasm of ascending colon    Benign neoplasm of transverse colon    Benign neoplasm of sigmoid colon    PCP:  [name], MD Pharmacy:   [facility], [address] [place] [address] Phone: [phone] Fax: [phone]     Social Determinants of Health (SDOH) Interventions    Readmission Risk Interventions No flowsheet data found.

## 2021-07-24 NOTE — Progress Notes (Signed)
Patient aggressive and combative with staff again. Patient attempting to exit bed and not following commands. When attempts made to re-direct patient becomes more belligerent. Security called. Patient assisted back to bed. Patient medicated with PRN medication.

## 2021-07-24 NOTE — Progress Notes (Signed)
Patient finally awake and interacting with staff. Patient currently refusing to take po medications. Patient states, "try again when I get all the way woken up."

## 2021-07-25 DIAGNOSIS — F03918 Unspecified dementia, unspecified severity, with other behavioral disturbance: Secondary | ICD-10-CM | POA: Diagnosis not present

## 2021-07-25 DIAGNOSIS — N179 Acute kidney failure, unspecified: Secondary | ICD-10-CM | POA: Diagnosis not present

## 2021-07-25 DIAGNOSIS — I10 Essential (primary) hypertension: Secondary | ICD-10-CM | POA: Diagnosis not present

## 2021-07-25 LAB — BASIC METABOLIC PANEL
Anion gap: 11 (ref 5–15)
BUN: 48 mg/dL — ABNORMAL HIGH (ref 8–23)
CO2: 25 mmol/L (ref 22–32)
Calcium: 9.9 mg/dL (ref 8.9–10.3)
Chloride: 108 mmol/L (ref 98–111)
Creatinine, Ser: 1.67 mg/dL — ABNORMAL HIGH (ref 0.61–1.24)
GFR, Estimated: 42 mL/min — ABNORMAL LOW (ref >60)
Glucose, Bld: 96 mg/dL (ref 70–99)
Potassium: 4.4 mmol/L (ref 3.5–5.1)
Sodium: 144 mmol/L (ref 135–145)

## 2021-07-25 LAB — CBC
HCT: 38.3 % — ABNORMAL LOW (ref 39.0–52.0)
Hemoglobin: 12.7 g/dL — ABNORMAL LOW (ref 13.0–17.0)
MCH: 30.2 pg (ref 26.0–34.0)
MCHC: 33.2 g/dL (ref 30.0–36.0)
MCV: 91.2 fL (ref 80.0–100.0)
Platelets: 179 10*3/uL (ref 150–400)
RBC: 4.2 MIL/uL — ABNORMAL LOW (ref 4.22–5.81)
RDW: 13.3 % (ref 11.5–15.5)
WBC: 7.3 10*3/uL (ref 4.0–10.5)
nRBC: 0 % (ref 0.0–0.2)

## 2021-07-25 NOTE — Progress Notes (Addendum)
[facility]  ROUNDING NOTE   Subjective:   Patient seen sitting up in bed, sitter at bedside Alert and oriented to self Tolerating small meals Denies shortness of breath, remains on room air No edema  Creatinine 1.67  Objective:  Vital signs in last 24 hours:  Temp:  [97.8 F (36.6 C)-98.6 F (37 C)] 98.2 F (36.8 C) (01/31 0800) Pulse Rate:  [64-89] 73 (01/31 0800) Resp:  [13-16] 16 (01/31 0800) BP: (124-178)/(85-94) 172/94 (01/31 0800) SpO2:  [98 %-100 %] 98 % (01/31 0800)  Weight change:  Filed Weights   07/20/21 2229  Weight: 70.9 kg    Intake/Output: No intake/output data recorded.   Intake/Output this shift:  No intake/output data recorded.  Physical Exam: General: No acute distress  Head: Normocephalic, atraumatic. Moist oral mucosal membranes  Eyes: Anicteric  Lungs:  Clear to auscultation, normal effort  Heart: S1S2 no rubs  Abdomen:  Soft, nontender, bowel sounds present  Extremities: No peripheral edema.  Neurologic: Awake, but confused  Skin: No acute rash  Access: No hemodialysis access    Basic Metabolic Panel: Recent Labs  Lab 07/21/21 0702 07/22/21 0944 07/23/21 1403 07/24/21 0907 07/25/21 0539  NA 140 143 145 147* 144  K 5.3* 4.8 4.6 4.5 4.4  CL 106 112* 113* 112* 108  CO2 22 23 23 25 25   GLUCOSE 82 64* 75 84 96  BUN 102* 70* 59* 56* 48*  CREATININE 4.21* 2.73* 2.22* 2.06* 1.67*  CALCIUM 9.3 9.5 9.6 9.7 9.9     Liver Function Tests: Recent Labs  Lab 07/20/21 2248  AST 21  ALT 11  ALKPHOS 81  BILITOT 0.6  PROT 8.0  ALBUMIN 4.3    No results for input(s): LIPASE, AMYLASE in the last 168 hours. No results for input(s): AMMONIA in the last 168 hours.  CBC: Recent Labs  Lab 07/21/21 0702 07/22/21 0944 07/23/21 1403 07/24/21 0907 07/25/21 0539  WBC 4.3 5.1 6.0 6.5 7.3  HGB 12.6* 11.9* 11.9* 12.5* 12.7*  HCT 38.7* 37.0* 36.8* 38.4* 38.3*  MCV 90.0 90.2 91.3 89.9 91.2  PLT 158 144* 151 159 179      Cardiac Enzymes: No results for input(s): CKTOTAL, CKMB, CKMBINDEX, TROPONINI in the last 168 hours.  BNP: Invalid input(s): POCBNP  CBG: Recent Labs  Lab 07/20/21 2300  GLUCAP 92     Microbiology: Results for orders placed or performed during the hospital encounter of 07/20/21  Resp Panel by RT-PCR (Flu A&B, Covid) Urine, Clean Catch     Status: None   Collection Time: 07/20/21 11:36 PM   Specimen: Urine, Clean Catch; Nasopharyngeal(NP) swabs in vial transport medium  Result Value Ref Range Status   SARS Coronavirus 2 by RT PCR NEGATIVE NEGATIVE Final    Comment: (NOTE) SARS-CoV-2 target nucleic acids are NOT DETECTED.  The SARS-CoV-2 RNA is generally detectable in upper respiratory specimens during the acute phase of infection. The lowest concentration of SARS-CoV-2 viral copies this assay can detect is 138 copies/mL. A negative result does not preclude SARS-Cov-2 infection and should not be used as the sole basis for treatment or other patient management decisions. A negative result may occur with  improper specimen collection/handling, submission of specimen other than nasopharyngeal swab, presence of viral mutation(s) within the areas targeted by this assay, and inadequate number of viral copies(<138 copies/mL). A negative result must be combined with clinical observations, patient history, and epidemiological information. The expected result is Negative.  Fact Sheet for Patients:  EntrepreneurPulse.com.au  Fact Sheet for Healthcare Providers:  IncredibleEmployment.be  This test is no t yet approved or cleared by the [place] FDA and  has been authorized for detection and/or diagnosis of SARS-CoV-2 by FDA under an Emergency Use Authorization (EUA). This EUA will remain  in effect (meaning this test can be used) for the duration of the COVID-19 declaration under Section 564(b)(1) of the Act, 21 U.S.C.section  360bbb-3(b)(1), unless the authorization is terminated  or revoked sooner.       Influenza A by PCR NEGATIVE NEGATIVE Final   Influenza B by PCR NEGATIVE NEGATIVE Final    Comment: (NOTE) The Xpert Xpress SARS-CoV-2/FLU/RSV plus assay is intended as an aid in the diagnosis of influenza from Nasopharyngeal swab specimens and should not be used as a sole basis for treatment. Nasal washings and aspirates are unacceptable for Xpert Xpress SARS-CoV-2/FLU/RSV testing.  Fact Sheet for Patients: EntrepreneurPulse.com.au  Fact Sheet for Healthcare Providers: IncredibleEmployment.be  This test is not yet approved or cleared by the [place] FDA and has been authorized for detection and/or diagnosis of SARS-CoV-2 by FDA under an Emergency Use Authorization (EUA). This EUA will remain in effect (meaning this test can be used) for the duration of the COVID-19 declaration under Section 564(b)(1) of the Act, 21 U.S.C. section 360bbb-3(b)(1), unless the authorization is terminated or revoked.  Performed at [facility], [address] [place], [place] [address]     Coagulation Studies: No results for input(s): LABPROT, INR in the last 72 hours.  Urinalysis: No results for input(s): COLORURINE, LABSPEC, PHURINE, GLUCOSEU, HGBUR, BILIRUBINUR, KETONESUR, PROTEINUR, UROBILINOGEN, NITRITE, LEUKOCYTESUR in the last 72 hours.  Invalid input(s): APPERANCEUR    Imaging: No results found.   Medications:    sodium chloride Stopped (07/22/21 1500)    amLODipine  10 mg Oral Daily   aspirin  81 mg Oral Daily   citalopram  40 mg Oral Daily   divalproex  125 mg Oral Q12H   feeding supplement  237 mL Oral TID BM   heparin  5,000 Units Subcutaneous Q8H   hydrALAZINE  25 mg Oral BID   multivitamin with minerals  1 tablet Oral Daily   acetaminophen **OR** acetaminophen, haloperidol lactate, hydrALAZINE, LORazepam, magnesium hydroxide,  ondansetron **OR** ondansetron (ZOFRAN) IV, traZODone, ziprasidone  Assessment/ Plan:  76 y.o. male with past medical history of hypertension, diabetes mellitus type 2, dementia with behavioral changes, hyperlipidemia, chronic diastolic heart failure, gout, chronic back pain who presented with altered mental status, decreased p.o. intake, nausea, vomiting, and acute kidney injury.  1.  Acute kidney injury/chronic kidney disease stage IV baseline creatinine 3.2.  Suspect acute kidney injury due to nausea, vomiting, poor p.o. intake.    Per nursing, patient became agitated and removed IV.  Recommend encouraging oral intake to prevent further dehydration.  Creatinine continuing to improve greater than baseline.  2.  Anemia of chronic kidney disease.  Hemoglobin 12.7.  Within acceptable range  3.  Hypertension.  Patient remains on home regimen of amlodipine and hydralazine.  Blood pressure stable   LOS: 4   1/31/202311:56 AM

## 2021-07-25 NOTE — TOC Progression Note (Signed)
Transition of Care [facility]) - Progression Note    Patient Details  Name: Dillon Schultz MRN: [id number] Date of Birth: Aug 15, 1945  Transition of Care [facility]) CM/SW [place], RN Phone Number: 07/25/2021, 1:47 PM  Clinical Narrative:  Patient's son will call to try to give patient a ride home by either him or a friend.  EMS will not be able to take patient and son does not feel comfortable sending an uber for patient.  Son will call back with transportation arrangements.     Expected Discharge Plan: Home/Self Care Barriers to Discharge: Continued Medical Work up  Expected Discharge Plan and Services Expected Discharge Plan: Home/Self Care   Discharge Planning Services: CM Consult   Living arrangements for the past 2 months: Single Family Home Expected Discharge Date: 07/25/21                                     Social Determinants of Health (SDOH) Interventions    Readmission Risk Interventions No flowsheet data found.

## 2021-07-25 NOTE — Progress Notes (Signed)
OT Cancellation Note  Patient Details Name: Macari Zalesky MRN: [id number] DOB: 09-09-1945   Cancelled Treatment:    Reason Eval/Treat Not Completed: Fatigue/lethargy limiting ability to participate. Attempted to wake patient for therapy (co tx with PT) via multi modal cueing and pt opening eyes, stating he wanted to remain in bed. Pt holding fist up to PT at any attempt to mobilize. Pt wishes respected, will reattempt as able. 1:1 sitter present throughout and will contact OT/PT if pt attempts to mobilize.  Shanon Payor, OTD OTR/L  07/25/21, 12:59 PM

## 2021-07-25 NOTE — TOC Progression Note (Signed)
Transition of Care [facility]) - Progression Note    Patient Details  Name: Vir Whetstine MRN: [id number] Date of Birth: July 05, 1945  Transition of Care [facility]) CM/SW [place], RN Phone Number: 07/25/2021, 10:54 AM  Clinical Narrative:   RNCM unable to get [place] for patient due to insurance of [facility] and also due to patient's behavior.  [facility] stated no, [facility] stated no.  Requests to Jan Fireman, [place] and [facility], no responses at this time.  Will monitor for responses, as patient is discharging today.      Expected Discharge Plan: Home/Self Care Barriers to Discharge: Continued Medical Work up  Expected Discharge Plan and Services Expected Discharge Plan: Home/Self Care   Discharge Planning Services: CM Consult   Living arrangements for the past 2 months: Single Family Home                                       Social Determinants of Health (SDOH) Interventions    Readmission Risk Interventions No flowsheet data found.

## 2021-07-25 NOTE — Progress Notes (Signed)
PT Cancellation Note  Patient Details Name: Dillon Schultz MRN: [id number] DOB: [birth year]   Cancelled Treatment:    Reason Eval/Treat Not Completed: Patient declined, no reason specified. Upon entry to room pt asleep, arouses to voice and light. PT/OT attempts made with encouragement to attempt mobility. Pt holding fist up to therapist with attempts to transfer to EoB with pt pulling covers back up when pulled down. Due to known aggressive behavior and pt being calm right now with polite refusal, further attempts to mobilize pt deferred. Sitter informed to contact OT/PT later on if pt appears willing to ambulate. Will re-attempt at later time and dte otherwise.   Salem Caster. Fairly IV, PT, DPT Physical Therapist- [facility]  07/25/2021, 11:51 AM

## 2021-07-25 NOTE — Progress Notes (Signed)
[facility] Hospital Liaison note:  This is a pending outpatient-based Palliative Care patient. Will continue to follow for disposition.  Please call with any outpatient palliative questions or concerns.  Thank you, Dee Curry, LPN [facility] Hospital Liaison [phone] 

## 2021-07-25 NOTE — Care Management Important Message (Signed)
Important Message  Patient Details  Name: Dillon Schultz MRN: [id number] Date of Birth: 27-Sep-1945   Medicare Important Message Given:  Yes  Reviewed the Important Message from Medicare with son, Riely Baskett by phone [phone].  Family in agreement with the discharge and copy sent as requested. Thanked him for his time.   Juliann Pulse A Lailani Tool 07/25/2021, 1:54 PM

## 2021-07-25 NOTE — Plan of Care (Signed)

## 2021-07-25 NOTE — Discharge Summary (Signed)
Physician Discharge Summary  Dillon Schultz OYD:[id number] DOB: [birth year] DOA: 07/20/2021  PCP: Dillon Coffin, MD  Admit date: 07/20/2021 Discharge date: 07/25/2021  Admitted From: home Disposition:  home w/ home health   Recommendations for Outpatient Follow-up:  Follow up with PCP in 1 week  Home Health: yes Equipment/Devices:  Discharge Condition: stable  CODE STATUS: full  Diet recommendation: dysphagia III diet   Brief/Interim Summary: HPI was taken from Dr. Sidney Schultz: Dillon Schultz is a 76 y.o. male with medical history significant for hypertension, type 2 diabetes mellitus, dementia with behavioral changes, dyslipidemia, chronic diastolic CHF gout and chronic back pain, presented to the ER with acute onset of altered mental status with generalized weakness and decreased p.o. intake.  She has been having nausea and vomiting as well as diarrhea.  No fever or chills.  No cough or wheezing or dyspnea.  No dysuria, oliguria or hematuria or flank pain.  He has bilateral lower extremity edema without orthopnea or paroxysmal nocturnal dyspnea.  The main history is obtained from his son very near and otherwise history is limited due to the patient's dementia.  ED Course: Upon presentation to the emergency room, heart rate was 109 with respiratory rate of 6 and later 14 then 20 and pulse oximetry of 92 and later 96% on room air. Labs revealed a BUN of 105 and creatinine 5.14 with a potassium of 5.1 and otherwise unremarkable CMP.  Previous BUN and creatinine were 46 and 2.34 10 days ago.  High-sensitivity troponin I was 97.  CBC was within normal. EKG as reviewed by me : EKG showed sinus bradycardia with rate of 53 with short PR interval and nonspecific intraventricular conduction delay.. Imaging: Portable chest x-ray showed no acute cardiopulmonary disease.  Abdominal pelvic CT scan revealed aortic atherosclerosis with no acute abdominal or pelvic findings.  Noncontrasted CT scan revealed atrophy with  chronic microvascular ischemic disease with no acute intracranial normalities.  The patient was given 1 L bolus of IV normal saline.  He will be admitted to a medical bed for further evaluation and management.  Hospital course from Dr. Jimmye Norman 1/27-1/31/23: Pt was found to have AKI on CKD. Pt was treated w/ IVFs and Cr was 1.67 on the day of d/c. Furthermore, pt had very aggressive & combative behavior while here (hitting staff members) & was given ativan, haldol prn. Pt's was awake, polite and answering questions appropriately this morning when I saw the pt. PT/OT evaluated the pt and recommended SNF but pt's family refused and agreed to home health. Finally, pt was put on IVC on admission and this was d/c by psych prior to discharge. Pt evidently made a threat in regards to shooting his wife and family but then when asked by psych pt denied this. This was relayed to pt's son, Dillon Schultz, and he was told to remove all firearms from the pt's house. Pt's son, Dillon Schultz, verbalized his understanding.   Discharge Diagnoses:  Principal Problem:   AKI (acute kidney injury) ([place])  AKI on CKDIV: possibly secondary to dehydration. Cr is better than baseline today. Avoid nephrotoxic meds. Continue on IVFs. Nephro recs apprec   Hyperkalemia: resolved     Dementia: continue w/ supportive. Continue on home dose of depakote. Haldol, ativan prn    Thrombocytopenia: resolved     HTN: continue on home dose of amlodipine, hydralazine    Depression: severity unknown. Continue on home dose of citalopram    DM2: likely well controlled. Diet controlled  Discharge Instructions  Discharge Instructions     Diet general   Complete by: As directed    Dysphagia III diet   Discharge instructions   Complete by: As directed    F/u w/ PCP in 1 week   Increase activity slowly   Complete by: As directed    No wound care   Complete by: As directed       Allergies as of 07/25/2021       Reactions   Metformin  Shortness Of Breath   Made him feel "crazy"    Glucophage [metformin Hcl]    Made him feel "crazy"         Medication List     TAKE these medications    amLODipine 10 MG tablet Commonly known as: NORVASC Take 1 tablet (10 mg total) by mouth daily.   aspirin 81 MG chewable tablet Chew 81 mg by mouth daily.   citalopram 40 MG tablet Commonly known as: CELEXA Take 40 mg by mouth daily.   divalproex 125 MG DR tablet Commonly known as: DEPAKOTE Take 1 tablet (125 mg total) by mouth every 12 (twelve) hours.   hydrALAZINE 25 MG tablet Commonly known as: APRESOLINE Take 1 tablet (25 mg total) by mouth 2 (two) times daily.        Allergies  Allergen Reactions   Metformin Shortness Of Breath    Made him feel "crazy"    Glucophage [Metformin Hcl]     Made him feel "crazy"     Consultations: Psych Nephro    Procedures/Studies: CT Abdomen Pelvis Wo Contrast  Result Date: 07/21/2021 CLINICAL DATA:  Nausea, vomiting, diarrhea for EXAM: CT ABDOMEN AND PELVIS WITHOUT CONTRAST TECHNIQUE: Multidetector CT imaging of the abdomen and pelvis was performed following the standard protocol without IV contrast. RADIATION DOSE REDUCTION: This exam was performed according to the departmental dose-optimization program which includes automated exposure control, adjustment of the mA and/or kV according to patient size and/or use of iterative reconstruction technique. COMPARISON:  09/05/2009 FINDINGS: Lower chest: Heart no acute abnormality Hepatobiliary: No focal hepatic abnormality. Gallbladder unremarkable. Pancreas: No focal abnormality or ductal dilatation. Spleen: No focal abnormality.  Normal size. Adrenals/Urinary Tract: No adrenal abnormality. No focal renal abnormality. No stones or hydronephrosis. Urinary bladder is unremarkable. Stomach/Bowel: Stomach, large and small bowel grossly unremarkable. Vascular/Lymphatic: Aortic atherosclerosis. No evidence of aneurysm or adenopathy.  Reproductive: No visible focal abnormality. Other: No free fluid or free air. Musculoskeletal: No acute bony abnormality. IMPRESSION: No acute findings in the abdomen or pelvis. Aortic atherosclerosis. Electronically Signed   By: Rolm Baptise M.D.   On: 07/21/2021 00:36   CT Head Wo Contrast  Result Date: 07/21/2021 CLINICAL DATA:  Mental status changes EXAM: CT HEAD WITHOUT CONTRAST TECHNIQUE: Contiguous axial images were obtained from the base of the skull through the vertex without intravenous contrast. RADIATION DOSE REDUCTION: This exam was performed according to the departmental dose-optimization program which includes automated exposure control, adjustment of the mA and/or kV according to patient size and/or use of iterative reconstruction technique. COMPARISON:  12/22/2020 FINDINGS: Brain: There is atrophy and chronic small vessel disease changes. No acute intracranial abnormality. Specifically, no hemorrhage, hydrocephalus, mass lesion, acute infarction, or significant intracranial injury. Vascular: No hyperdense vessel or unexpected calcification. Skull: No acute calvarial abnormality. Sinuses/Orbits: No acute findings Other: None IMPRESSION: Atrophy, chronic microvascular disease. No acute intracranial abnormality. Electronically Signed   By: Rolm Baptise M.D.   On: 07/21/2021 00:38   US RENAL  Result Date: 07/07/2021  CLINICAL DATA:  Acute kidney injury. EXAM: RENAL / URINARY TRACT ULTRASOUND COMPLETE COMPARISON:  None. FINDINGS: Right Kidney: Renal measurements: 8.4 x 4.3 x 4.6 cm = volume: 86 mL. Mildly increased renal parenchymal echogenicity noted. No mass or hydronephrosis visualized. Left Kidney: Renal measurements: 8.3 x 5.2 x 4.9 cm = volume: 111 mL. Mildly increased renal parenchymal echogenicity noted. No mass or hydronephrosis visualized. Bladder: Appears normal for degree of bladder distention. Other: None. IMPRESSION: Mildly increased renal parenchymal echogenicity, consistent with  medical renal disease. No evidence of hydronephrosis. Electronically Signed   By: Marlaine Hind M.D.   On: 07/07/2021 19:18   DG Chest Port 1 View  Result Date: 07/20/2021 CLINICAL DATA:  Altered mental status EXAM: PORTABLE CHEST 1 VIEW COMPARISON:  07/07/2021 FINDINGS: The heart size and mediastinal contours are within normal limits. Both lungs are clear. The visualized skeletal structures are unremarkable. IMPRESSION: No active disease. Electronically Signed   By: Donavan Foil M.D.   On: 07/20/2021 23:41   DG Chest Portable 1 View  Result Date: 07/07/2021 CLINICAL DATA:  Weakness and syncope. EXAM: PORTABLE CHEST 1 VIEW COMPARISON:  Chest x-ray dated August 30, 2016. FINDINGS: The heart size and mediastinal contours are within normal limits. Both lungs are clear. The visualized skeletal structures are unremarkable. IMPRESSION: No active disease. Electronically Signed   By: Titus Dubin M.D.   On: 07/07/2021 16:07   ECHOCARDIOGRAM COMPLETE  Result Date: 07/08/2021    ECHOCARDIOGRAM REPORT   Patient Name:   TEAGON KRON Date of Exam: 07/08/2021 Medical Rec #:  [id number]    Height:       68.0 in Accession #:    [id number]   Weight:       169.8 lb Date of Birth:  [birth year]    BSA:          1.906 m Patient Age:    75 years     BP:           141/79 mmHg Patient Gender: M            HR:           53 bpm. Exam Location:  [facility] Procedure: 2D Echo and Strain Analysis Indications:     Syncope R55  History:         Patient has no prior history of Echocardiogram examinations.  Sonographer:     Kathlen Brunswick RDCS Referring Phys:  [id number] PXTGGYIR AGBATA Diagnosing Phys: Donnelly Angelica  Sonographer Comments: Global longitudinal strain was attempted. IMPRESSIONS  1. Left ventricular ejection fraction, by estimation, is 60 to 65%. The left ventricle has normal function. The left ventricle has no regional wall motion abnormalities. Left ventricular diastolic parameters were normal.  2. Right ventricular systolic function  is normal. The right ventricular size is normal.  3. The mitral valve is normal in structure. No evidence of mitral valve regurgitation. No evidence of mitral stenosis.  4. The aortic valve is normal in structure. Aortic valve regurgitation is not visualized. No aortic stenosis is present.  5. The inferior vena cava is normal in size with greater than 50% respiratory variability, suggesting right atrial pressure of 3 mmHg. FINDINGS  Left Ventricle: Left ventricular ejection fraction, by estimation, is 60 to 65%. The left ventricle has normal function. The left ventricle has no regional wall motion abnormalities. The left ventricular internal cavity size was normal in size. There is  no left ventricular hypertrophy. Left ventricular diastolic parameters were normal. Right Ventricle: The right  ventricular size is normal. No increase in right ventricular wall thickness. Right ventricular systolic function is normal. Left Atrium: Left atrial size was normal in size. Right Atrium: Right atrial size was normal in size. Pericardium: There is no evidence of pericardial effusion. Mitral Valve: The mitral valve is normal in structure. No evidence of mitral valve regurgitation. No evidence of mitral valve stenosis. Tricuspid Valve: The tricuspid valve is normal in structure. Tricuspid valve regurgitation is trivial. No evidence of tricuspid stenosis. Aortic Valve: The aortic valve is normal in structure. Aortic valve regurgitation is not visualized. No aortic stenosis is present. Aortic valve peak gradient measures 8.3 mmHg. Pulmonic Valve: The pulmonic valve was normal in structure. Pulmonic valve regurgitation is not visualized. No evidence of pulmonic stenosis. Aorta: The aortic root is normal in size and structure. Venous: The inferior vena cava is normal in size with greater than 50% respiratory variability, suggesting right atrial pressure of 3 mmHg. IAS/Shunts: No atrial level shunt detected by color flow Doppler.  LEFT  VENTRICLE PLAX 2D LVIDd:         4.60 cm      Diastology LVIDs:         3.10 cm      LV e' medial:    8.05 cm/s LV PW:         1.10 cm      LV E/e' medial:  6.3 LV IVS:        0.90 cm      LV e' lateral:   7.83 cm/s LVOT diam:     2.20 cm      LV E/e' lateral: 6.5 LV SV:         71 LV SV Index:   37 LVOT Area:     3.80 cm  LV Volumes (MOD) LV vol d, MOD A2C: 114.0 ml LV vol d, MOD A4C: 73.1 ml LV vol s, MOD A2C: 41.8 ml LV vol s, MOD A4C: 20.8 ml LV SV MOD A2C:     72.2 ml LV SV MOD A4C:     73.1 ml LV SV MOD BP:      64.7 ml RIGHT VENTRICLE RV Basal diam:  3.10 cm RV S prime:     13.70 cm/s TAPSE (M-mode): 2.4 cm LEFT ATRIUM             Index        RIGHT ATRIUM           Index LA diam:        3.40 cm 1.78 cm/m   RA Area:     12.90 cm LA Vol (A2C):   32.8 ml 17.21 ml/m  RA Volume:   32.50 ml  17.05 ml/m LA Vol (A4C):   19.4 ml 10.18 ml/m LA Biplane Vol: 25.9 ml 13.59 ml/m  AORTIC VALVE AV Area (Vmax): 2.69 cm AV Vmax:        144.00 cm/s AV Peak Grad:   8.3 mmHg LVOT Vmax:      102.00 cm/s LVOT Vmean:     61.900 cm/s LVOT VTI:       0.188 m  AORTA Ao Root diam: 3.20 cm Ao Asc diam:  3.30 cm MITRAL VALVE               TRICUSPID VALVE MV Area (PHT): 2.20 cm    TV Peak grad:   19.4 mmHg MV Decel Time: 345 msec    TV Vmax:        2.20  m/s MV E velocity: 51.00 cm/s MV A velocity: 81.40 cm/s  SHUNTS MV E/A ratio:  0.63        Systemic VTI:  0.19 m                            Systemic Diam: 2.20 cm Donnelly Angelica Electronically signed by Donnelly Angelica Signature Date/Time: 07/08/2021/1:57:02 PM    Final    (Echo, Carotid, EGD, Colonoscopy, ERCP)    Subjective: Pt denies any complaints    Discharge Exam: Vitals:   07/25/21 0330 07/25/21 0800  BP: 124/85 (!) 172/94  Pulse: 89 73  Resp: 16 16  Temp: 97.8 F (36.6 C) 98.2 F (36.8 C)  SpO2: 100% 98%   Vitals:   07/24/21 1214 07/24/21 1721 07/25/21 0330 07/25/21 0800  BP: (!) 178/93 (!) 145/88 124/85 (!) 172/94  Pulse: 64 74 89 73  Resp: 13 16 16 16   Temp:  97.8 F (36.6 C) 98.6 F (37 C) 97.8 F (36.6 C) 98.2 F (36.8 C)  TempSrc: Oral   Oral  SpO2: 100% 100% 100% 98%  Weight:      Height:        General: Pt is alert, awake, not in acute distress. Disheveled  Cardiovascular: S1/S2 +, no rubs, no gallops Respiratory: CTA bilaterally, no wheezing, no rhonchi Abdominal: Soft, NT, ND, bowel sounds + Extremities: b/l LE edema, no cyanosis    The results of significant diagnostics from this hospitalization (including imaging, microbiology, ancillary and laboratory) are listed below for reference.     Microbiology: Recent Results (from the past 240 hour(s))  Resp Panel by RT-PCR (Flu A&B, Covid) Urine, Clean Catch     Status: None   Collection Time: 07/20/21 11:36 PM   Specimen: Urine, Clean Catch; Nasopharyngeal(NP) swabs in vial transport medium  Result Value Ref Range Status   SARS Coronavirus 2 by RT PCR NEGATIVE NEGATIVE Final    Comment: (NOTE) SARS-CoV-2 target nucleic acids are NOT DETECTED.  The SARS-CoV-2 RNA is generally detectable in upper respiratory specimens during the acute phase of infection. The lowest concentration of SARS-CoV-2 viral copies this assay can detect is 138 copies/mL. A negative result does not preclude SARS-Cov-2 infection and should not be used as the sole basis for treatment or other patient management decisions. A negative result may occur with  improper specimen collection/handling, submission of specimen other than nasopharyngeal swab, presence of viral mutation(s) within the areas targeted by this assay, and inadequate number of viral copies(<138 copies/mL). A negative result must be combined with clinical observations, patient history, and epidemiological information. The expected result is Negative.  Fact Sheet for Patients:  EntrepreneurPulse.com.au  Fact Sheet for Healthcare Providers:  IncredibleEmployment.be  This test is no t yet approved or  cleared by the [place] FDA and  has been authorized for detection and/or diagnosis of SARS-CoV-2 by FDA under an Emergency Use Authorization (EUA). This EUA will remain  in effect (meaning this test can be used) for the duration of the COVID-19 declaration under Section 564(b)(1) of the Act, 21 U.S.C.section 360bbb-3(b)(1), unless the authorization is terminated  or revoked sooner.       Influenza A by PCR NEGATIVE NEGATIVE Final   Influenza B by PCR NEGATIVE NEGATIVE Final    Comment: (NOTE) The Xpert Xpress SARS-CoV-2/FLU/RSV plus assay is intended as an aid in the diagnosis of influenza from Nasopharyngeal swab specimens and should not be used as a sole  basis for treatment. Nasal washings and aspirates are unacceptable for Xpert Xpress SARS-CoV-2/FLU/RSV testing.  Fact Sheet for Patients: EntrepreneurPulse.com.au  Fact Sheet for Healthcare Providers: IncredibleEmployment.be  This test is not yet approved or cleared by the [place] FDA and has been authorized for detection and/or diagnosis of SARS-CoV-2 by FDA under an Emergency Use Authorization (EUA). This EUA will remain in effect (meaning this test can be used) for the duration of the COVID-19 declaration under Section 564(b)(1) of the Act, 21 U.S.C. section 360bbb-3(b)(1), unless the authorization is terminated or revoked.  Performed at [facility], [address] [place], [place] [address]      Labs: BNP (last 3 results) Recent Labs    12/22/20 0321 07/07/21 1544  BNP 175.5* 6.8   Basic Metabolic Panel: Recent Labs  Lab 07/21/21 0702 07/22/21 0944 07/23/21 1403 07/24/21 0907 07/25/21 0539  NA 140 143 145 147* 144  K 5.3* 4.8 4.6 4.5 4.4  CL 106 112* 113* 112* 108  CO2 22 23 23 25 25   GLUCOSE 82 64* 75 84 96  BUN 102* 70* 59* 56* 48*  CREATININE 4.21* 2.73* 2.22* 2.06* 1.67*  CALCIUM 9.3 9.5 9.6 9.7 9.9   Liver Function Tests: Recent Labs   Lab 07/20/21 2248  AST 21  ALT 11  ALKPHOS 81  BILITOT 0.6  PROT 8.0  ALBUMIN 4.3   No results for input(s): LIPASE, AMYLASE in the last 168 hours. No results for input(s): AMMONIA in the last 168 hours. CBC: Recent Labs  Lab 07/21/21 0702 07/22/21 0944 07/23/21 1403 07/24/21 0907 07/25/21 0539  WBC 4.3 5.1 6.0 6.5 7.3  HGB 12.6* 11.9* 11.9* 12.5* 12.7*  HCT 38.7* 37.0* 36.8* 38.4* 38.3*  MCV 90.0 90.2 91.3 89.9 91.2  PLT 158 144* 151 159 179   Cardiac Enzymes: No results for input(s): CKTOTAL, CKMB, CKMBINDEX, TROPONINI in the last 168 hours. BNP: Invalid input(s): POCBNP CBG: Recent Labs  Lab 07/20/21 2300  GLUCAP 92   D-Dimer No results for input(s): DDIMER in the last 72 hours. Hgb A1c No results for input(s): HGBA1C in the last 72 hours.  Lipid Profile No results for input(s): CHOL, HDL, LDLCALC, TRIG, CHOLHDL, LDLDIRECT in the last 72 hours. Thyroid function studies No results for input(s): TSH, T4TOTAL, T3FREE, THYROIDAB in the last 72 hours.  Invalid input(s): FREET3 Anemia work up No results for input(s): VITAMINB12, FOLATE, FERRITIN, TIBC, IRON, RETICCTPCT in the last 72 hours. Urinalysis    Component Value Date/Time   COLORURINE YELLOW 07/20/2021 2336   APPEARANCEUR CLEAR 07/20/2021 2336   APPEARANCEUR Clear 02/01/2012 2026   LABSPEC 1.010 07/20/2021 2336   LABSPEC 1.018 02/01/2012 2026   PHURINE 5.0 07/20/2021 2336   GLUCOSEU NEGATIVE 07/20/2021 2336   GLUCOSEU Negative 02/01/2012 2026   HGBUR TRACE (A) 07/20/2021 2336   BILIRUBINUR NEGATIVE 07/20/2021 2336   BILIRUBINUR Negative 02/01/2012 2026   KETONESUR NEGATIVE 07/20/2021 2336   PROTEINUR 30 (A) 07/20/2021 2336   NITRITE NEGATIVE 07/20/2021 2336   LEUKOCYTESUR NEGATIVE 07/20/2021 2336   LEUKOCYTESUR Negative 02/01/2012 2026   Sepsis Labs Invalid input(s): PROCALCITONIN,  WBC,  LACTICIDVEN Microbiology Recent Results (from the past 240 hour(s))  Resp Panel by RT-PCR (Flu A&B,  Covid) Urine, Clean Catch     Status: None   Collection Time: 07/20/21 11:36 PM   Specimen: Urine, Clean Catch; Nasopharyngeal(NP) swabs in vial transport medium  Result Value Ref Range Status   SARS Coronavirus 2 by RT PCR NEGATIVE NEGATIVE Final  Comment: (NOTE) SARS-CoV-2 target nucleic acids are NOT DETECTED.  The SARS-CoV-2 RNA is generally detectable in upper respiratory specimens during the acute phase of infection. The lowest concentration of SARS-CoV-2 viral copies this assay can detect is 138 copies/mL. A negative result does not preclude SARS-Cov-2 infection and should not be used as the sole basis for treatment or other patient management decisions. A negative result may occur with  improper specimen collection/handling, submission of specimen other than nasopharyngeal swab, presence of viral mutation(s) within the areas targeted by this assay, and inadequate number of viral copies(<138 copies/mL). A negative result must be combined with clinical observations, patient history, and epidemiological information. The expected result is Negative.  Fact Sheet for Patients:  EntrepreneurPulse.com.au  Fact Sheet for Healthcare Providers:  IncredibleEmployment.be  This test is no t yet approved or cleared by the [place] FDA and  has been authorized for detection and/or diagnosis of SARS-CoV-2 by FDA under an Emergency Use Authorization (EUA). This EUA will remain  in effect (meaning this test can be used) for the duration of the COVID-19 declaration under Section 564(b)(1) of the Act, 21 U.S.C.section 360bbb-3(b)(1), unless the authorization is terminated  or revoked sooner.       Influenza A by PCR NEGATIVE NEGATIVE Final   Influenza B by PCR NEGATIVE NEGATIVE Final    Comment: (NOTE) The Xpert Xpress SARS-CoV-2/FLU/RSV plus assay is intended as an aid in the diagnosis of influenza from Nasopharyngeal swab specimens and should  not be used as a sole basis for treatment. Nasal washings and aspirates are unacceptable for Xpert Xpress SARS-CoV-2/FLU/RSV testing.  Fact Sheet for Patients: EntrepreneurPulse.com.au  Fact Sheet for Healthcare Providers: IncredibleEmployment.be  This test is not yet approved or cleared by the [place] FDA and has been authorized for detection and/or diagnosis of SARS-CoV-2 by FDA under an Emergency Use Authorization (EUA). This EUA will remain in effect (meaning this test can be used) for the duration of the COVID-19 declaration under Section 564(b)(1) of the Act, 21 U.S.C. section 360bbb-3(b)(1), unless the authorization is terminated or revoked.  Performed at [facility], [address] [place], [place] [address]      Time coordinating discharge: Over 30 minutes  SIGNED:   Wyvonnia Dusky, MD  [facility] 07/25/2021, 12:08 PM Pager   If 7PM-7AM, please contact night-coverage

## 2021-07-30 ENCOUNTER — Emergency Department: Payer: Medicare Other

## 2021-07-30 ENCOUNTER — Observation Stay: Payer: Medicare Other

## 2021-07-30 ENCOUNTER — Encounter: Payer: Self-pay | Admitting: Emergency Medicine

## 2021-07-30 ENCOUNTER — Other Ambulatory Visit: Payer: Self-pay

## 2021-07-30 ENCOUNTER — Inpatient Hospital Stay
Admission: EM | Admit: 2021-07-30 | Discharge: 2021-08-05 | DRG: 683 | Disposition: A | Discharge status: Home Health | Payer: Medicare Other | Attending: Internal Medicine | Admitting: Internal Medicine

## 2021-07-30 DIAGNOSIS — N179 Acute kidney failure, unspecified: Secondary | ICD-10-CM | POA: Diagnosis not present

## 2021-07-30 DIAGNOSIS — Z888 Allergy status to other drugs, medicaments and biological substances status: Secondary | ICD-10-CM

## 2021-07-30 DIAGNOSIS — R001 Bradycardia, unspecified: Secondary | ICD-10-CM | POA: Diagnosis present

## 2021-07-30 DIAGNOSIS — F03918 Unspecified dementia, unspecified severity, with other behavioral disturbance: Secondary | ICD-10-CM | POA: Diagnosis present

## 2021-07-30 DIAGNOSIS — E785 Hyperlipidemia, unspecified: Secondary | ICD-10-CM | POA: Diagnosis present

## 2021-07-30 DIAGNOSIS — N184 Chronic kidney disease, stage 4 (severe): Secondary | ICD-10-CM | POA: Diagnosis present

## 2021-07-30 DIAGNOSIS — Z79899 Other long term (current) drug therapy: Secondary | ICD-10-CM

## 2021-07-30 DIAGNOSIS — R55 Syncope and collapse: Secondary | ICD-10-CM | POA: Diagnosis present

## 2021-07-30 DIAGNOSIS — N19 Unspecified kidney failure: Secondary | ICD-10-CM

## 2021-07-30 DIAGNOSIS — M1990 Unspecified osteoarthritis, unspecified site: Secondary | ICD-10-CM | POA: Diagnosis present

## 2021-07-30 DIAGNOSIS — Z6821 Body mass index (BMI) 21.0-21.9, adult: Secondary | ICD-10-CM

## 2021-07-30 DIAGNOSIS — I5032 Chronic diastolic (congestive) heart failure: Secondary | ICD-10-CM | POA: Diagnosis present

## 2021-07-30 DIAGNOSIS — Z20822 Contact with and (suspected) exposure to covid-19: Secondary | ICD-10-CM | POA: Diagnosis present

## 2021-07-30 DIAGNOSIS — E860 Dehydration: Secondary | ICD-10-CM | POA: Diagnosis present

## 2021-07-30 DIAGNOSIS — Z7982 Long term (current) use of aspirin: Secondary | ICD-10-CM

## 2021-07-30 DIAGNOSIS — E162 Hypoglycemia, unspecified: Secondary | ICD-10-CM | POA: Diagnosis present

## 2021-07-30 DIAGNOSIS — E440 Moderate protein-calorie malnutrition: Secondary | ICD-10-CM | POA: Diagnosis present

## 2021-07-30 DIAGNOSIS — I10 Essential (primary) hypertension: Secondary | ICD-10-CM | POA: Diagnosis present

## 2021-07-30 DIAGNOSIS — I440 Atrioventricular block, first degree: Secondary | ICD-10-CM | POA: Diagnosis present

## 2021-07-30 DIAGNOSIS — I130 Hypertensive heart and chronic kidney disease with heart failure and stage 1 through stage 4 chronic kidney disease, or unspecified chronic kidney disease: Secondary | ICD-10-CM | POA: Diagnosis present

## 2021-07-30 DIAGNOSIS — Z87891 Personal history of nicotine dependence: Secondary | ICD-10-CM

## 2021-07-30 DIAGNOSIS — R531 Weakness: Secondary | ICD-10-CM

## 2021-07-30 DIAGNOSIS — I951 Orthostatic hypotension: Secondary | ICD-10-CM | POA: Diagnosis present

## 2021-07-30 LAB — COMPREHENSIVE METABOLIC PANEL
ALT: 14 U/L (ref 0–44)
AST: 23 U/L (ref 15–41)
Albumin: 3.9 g/dL (ref 3.5–5.0)
Alkaline Phosphatase: 81 U/L (ref 38–126)
Anion gap: 8 (ref 5–15)
BUN: 79 mg/dL — ABNORMAL HIGH (ref 8–23)
CO2: 26 mmol/L (ref 22–32)
Calcium: 9.6 mg/dL (ref 8.9–10.3)
Chloride: 102 mmol/L (ref 98–111)
Creatinine, Ser: 2.77 mg/dL — ABNORMAL HIGH (ref 0.61–1.24)
GFR, Estimated: 23 mL/min — ABNORMAL LOW (ref >60)
Glucose, Bld: 93 mg/dL (ref 70–99)
Potassium: 4.2 mmol/L (ref 3.5–5.1)
Sodium: 136 mmol/L (ref 135–145)
Total Bilirubin: 0.9 mg/dL (ref 0.3–1.2)
Total Protein: 7.7 g/dL (ref 6.5–8.1)

## 2021-07-30 LAB — GLUCOSE, CAPILLARY: Glucose-Capillary: 41 mg/dL — CL (ref 70–99)

## 2021-07-30 LAB — TROPONIN I (HIGH SENSITIVITY)
Troponin I (High Sensitivity): 82 ng/L — ABNORMAL HIGH (ref <18)
Troponin I (High Sensitivity): 80 ng/L — ABNORMAL HIGH (ref <18)

## 2021-07-30 LAB — CBC WITH DIFFERENTIAL/PLATELET
Abs Immature Granulocytes: 0.01 10*3/uL (ref 0.00–0.07)
Basophils Absolute: 0 10*3/uL (ref 0.0–0.1)
Basophils Relative: 0 %
Eosinophils Absolute: 0 10*3/uL (ref 0.0–0.5)
Eosinophils Relative: 1 %
HCT: 41.2 % (ref 39.0–52.0)
Hemoglobin: 13.3 g/dL (ref 13.0–17.0)
Immature Granulocytes: 0 %
Lymphocytes Relative: 11 %
Lymphs Abs: 0.6 10*3/uL — ABNORMAL LOW (ref 0.7–4.0)
MCH: 29.6 pg (ref 26.0–34.0)
MCHC: 32.3 g/dL (ref 30.0–36.0)
MCV: 91.6 fL (ref 80.0–100.0)
Monocytes Absolute: 0.5 10*3/uL (ref 0.1–1.0)
Monocytes Relative: 9 %
Neutro Abs: 4.3 10*3/uL (ref 1.7–7.7)
Neutrophils Relative %: 79 %
Platelets: 149 10*3/uL — ABNORMAL LOW (ref 150–400)
RBC: 4.5 MIL/uL (ref 4.22–5.81)
RDW: 13.1 % (ref 11.5–15.5)
WBC: 5.5 10*3/uL (ref 4.0–10.5)
nRBC: 0 % (ref 0.0–0.2)

## 2021-07-30 LAB — RESP PANEL BY RT-PCR (FLU A&B, COVID) ARPGX2
Influenza A by PCR: NEGATIVE
Influenza B by PCR: NEGATIVE
SARS Coronavirus 2 by RT PCR: NEGATIVE

## 2021-07-30 LAB — BRAIN NATRIURETIC PEPTIDE: B Natriuretic Peptide: 18.3 pg/mL (ref 0.0–100.0)

## 2021-07-30 LAB — TSH: TSH: 1.22 u[IU]/mL (ref 0.350–4.500)

## 2021-07-30 MED ORDER — LACTATED RINGERS IV BOLUS
1000.0000 mL | Freq: Once | INTRAVENOUS | Status: AC
  Administered 2021-07-30: 1000 mL via INTRAVENOUS

## 2021-07-30 MED ORDER — HYDRALAZINE HCL 25 MG PO TABS
25.0000 mg | ORAL_TABLET | Freq: Two times a day (BID) | ORAL | Status: DC
  Administered 2021-07-30 – 2021-08-05 (×11): 25 mg via ORAL
  Filled 2021-07-30 (×12): qty 1

## 2021-07-30 MED ORDER — ONDANSETRON HCL 4 MG/2ML IJ SOLN: 4.0000 mg | Freq: Four times a day (QID) | INTRAMUSCULAR | Status: DC | PRN

## 2021-07-30 MED ORDER — ONDANSETRON HCL 4 MG PO TABS: 4.0000 mg | ORAL_TABLET | Freq: Four times a day (QID) | ORAL | Status: DC | PRN

## 2021-07-30 MED ORDER — CITALOPRAM HYDROBROMIDE 10 MG PO TABS
40.0000 mg | ORAL_TABLET | Freq: Every day | ORAL | Status: DC
Start: 2021-07-30 — End: 2021-08-05
  Administered 2021-07-31 – 2021-08-05 (×6): 40 mg via ORAL
  Filled 2021-07-30 (×4): qty 2
  Filled 2021-07-30: qty 4

## 2021-07-30 MED ORDER — ENOXAPARIN SODIUM 40 MG/0.4ML IJ SOSY
40.0000 mg | PREFILLED_SYRINGE | INTRAMUSCULAR | Status: DC
  Administered 2021-07-30 – 2021-08-04 (×6): 40 mg via SUBCUTANEOUS
  Filled 2021-07-30 (×6): qty 0.4

## 2021-07-30 MED ORDER — AMLODIPINE BESYLATE 10 MG PO TABS
10.0000 mg | ORAL_TABLET | Freq: Every day | ORAL | Status: DC
  Administered 2021-07-31 – 2021-08-02 (×3): 10 mg via ORAL
  Filled 2021-07-30 (×3): qty 1

## 2021-07-30 MED ORDER — GABAPENTIN 100 MG PO CAPS
100.0000 mg | ORAL_CAPSULE | Freq: Three times a day (TID) | ORAL | Status: DC
  Administered 2021-07-30 – 2021-08-05 (×15): 100 mg via ORAL
  Filled 2021-07-30 (×17): qty 1

## 2021-07-30 MED ORDER — DIVALPROEX SODIUM 125 MG PO DR TAB
125.0000 mg | DELAYED_RELEASE_TABLET | Freq: Two times a day (BID) | ORAL | Status: DC
  Administered 2021-07-30 – 2021-08-02 (×7): 125 mg via ORAL
  Filled 2021-07-30 (×8): qty 1

## 2021-07-30 MED ORDER — ORAL CARE MOUTH RINSE
15.0000 mL | Freq: Two times a day (BID) | OROMUCOSAL | Status: DC
  Administered 2021-07-30 – 2021-08-05 (×10): 15 mL via OROMUCOSAL

## 2021-07-30 MED ORDER — ACETAMINOPHEN 325 MG PO TABS
650.0000 mg | ORAL_TABLET | Freq: Four times a day (QID) | ORAL | Status: DC | PRN
  Administered 2021-08-02: 650 mg via ORAL
  Filled 2021-07-30: qty 2

## 2021-07-30 MED ORDER — SODIUM CHLORIDE 0.9 % IV SOLN: INTRAVENOUS | Status: DC

## 2021-07-30 MED ORDER — ACETAMINOPHEN 650 MG RE SUPP: 650.0000 mg | Freq: Four times a day (QID) | RECTAL | Status: DC | PRN

## 2021-07-30 MED ORDER — SODIUM CHLORIDE 0.9% FLUSH
3.0000 mL | Freq: Two times a day (BID) | INTRAVENOUS | Status: DC
  Administered 2021-07-31 – 2021-08-03 (×7): 3 mL via INTRAVENOUS

## 2021-07-30 MED ORDER — ASPIRIN 81 MG PO CHEW
81.0000 mg | CHEWABLE_TABLET | Freq: Every day | ORAL | Status: DC
  Administered 2021-07-31 – 2021-08-05 (×6): 81 mg via ORAL
  Filled 2021-07-30 (×6): qty 1

## 2021-07-30 NOTE — ED Provider Notes (Signed)
----------------------------------------- °  3:27 PM on 07/30/2021 -----------------------------------------  Blood pressure (!) 143/81, pulse (!) 56, temperature 97.9 F (36.6 C), temperature source Oral, resp. rate 10, height 5\' 8"  (1.727 m), weight 70 kg, SpO2 100 %.  Assuming care from Dr. Jacqualine Code.  In short, Dillon Schultz is a 76 y.o. male with a chief complaint of Altered Mental Status and Loss of Consciousness .  Refer to the original H&P for additional details.  The current plan of care is to follow-up lab results, anticipate admission for syncopal episode and AKI.  ----------------------------------------- 4:12 PM on 07/30/2021 ----------------------------------------- Labs remarkable for AKI with significantly elevated BUN to creatinine ratio consistent with dehydration.  We will hydrate with IV fluid bolus, patient remains awake and alert on reassessment with sinus bradycardia in the 50s but normal blood pressure.  Given concerns for syncope and dehydration, case discussed with hospitalist for admission.    Blake Divine, MD 07/30/21 [phone]

## 2021-07-30 NOTE — ED Notes (Signed)
Pt refused to have 2nd troponin blood draw.

## 2021-07-30 NOTE — H&P (Signed)
History and Physical    Patient: Dillon Schultz ZOX:[id number] DOB: [birth year] DOA: 07/30/2021 DOS: the patient was seen and examined on 07/30/2021 PCP: [name], MD  Patient coming from: Home  Chief Complaint:  Chief Complaint  Patient presents with   Altered Mental Status   Loss of Consciousness   Most of the history was obtained from patient's son, Dillon Schultz  over the phone HPI: Dillon Schultz is a 76 y.o. male with medical history significant for dementia with behavioral disturbances, hypertension, dyslipidemia, chronic diastolic dysfunction CHF, first-degree AV block who was brought into the ER for evaluation after a witnessed syncopal episode at home. Patient's son states that since his discharge from the hospital about 5 days ago he has refused to eat or drink and has had very minimal oral intake. According to the patient's son patient had complained about feeling "woozy" earlier in the day before passing out. I am unable to do review of systems on this patient due to his underlying dementia  Review of Systems: unable to review all systems due to the inability of the patient to answer questions. 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 His smoking use included cigarettes. He has never used smokeless  tobacco. He reports that he does not drink alcohol and does not use drugs.  Allergies  Allergen Reactions   Metformin Shortness Of Breath    Made him feel "crazy"    Glucophage [Metformin Hcl]     Made him feel "crazy"     History reviewed. No pertinent family history.  Prior to Admission medications   Medication Sig Start Date End Date Taking? Authorizing Provider  amLODipine (NORVASC) 10 MG tablet Take 1 tablet (10 mg total) by mouth daily. 07/12/21  Yes Fritzi Mandes, MD  aspirin 81 MG chewable tablet Chew 81 mg by mouth daily. 07/18/21  Yes [provider]  citalopram (CELEXA) 40 MG tablet Take 40 mg by mouth daily. 03/27/21  Yes [provider]  divalproex (DEPAKOTE) 125 MG DR tablet Take 1 tablet (125 mg total) by mouth every 12 (twelve) hours. 07/11/21  Yes Fritzi Mandes, MD  gabapentin (NEURONTIN) 100 MG capsule Take 100 mg by mouth 3 (three) times daily. 07/18/21  Yes [provider]  hydrALAZINE (APRESOLINE) 25 MG tablet Take 1 tablet (25 mg total) by mouth 2 (two) times daily. 07/11/21  Yes Fritzi Mandes, MD  azelastine (ASTELIN) 137 MCG/SPRAY nasal spray Place 2 sprays into the nose 2 (two) times daily. Reported on 12/08/2015  12/22/20  [provider]  cetirizine (ZYRTEC) 10 MG tablet Take 10 mg by mouth daily. Reported on 12/08/2015  12/22/20  [provider]  furosemide (LASIX) 20 MG tablet Take 20 mg by  mouth daily.  12/22/20  [provider]    Physical Exam: Vitals:   07/30/21 1426 07/30/21 1430 07/30/21 1540 07/30/21 1600  BP:  (!) 143/88 (!) 143/82 125/85  Pulse:  (!) 52  (!) 53  Resp:  11 11 14   Temp:      TempSrc:      SpO2:  100%  93%  Weight: 70 kg     Height: 5\' 8"  (1.727 m)      Physical Exam Constitutional:      Comments: Sleeping but arouses to name call.  Thin and frail  HENT:     Head: Normocephalic and atraumatic.     Nose: Nose normal.     Mouth/Throat:     Mouth: Mucous membranes are dry.   Cardiovascular:     Rate and Rhythm: Bradycardia present.  Pulmonary:     Effort: Pulmonary effort is normal.     Breath sounds: Normal breath sounds.  Abdominal:     General: Abdomen is flat. Bowel sounds are normal.     Palpations: Abdomen is soft.  Musculoskeletal:     Cervical back: Normal range of motion and neck supple.     Comments: Unable to assess.  Lower extremity lymphedema  Skin:    General: Skin is warm and dry.  Neurological:     Comments: Unable to assess  Psychiatric:     Comments: Unable to assess      Data Reviewed: Labs reviewed Serum creatinine on admission is 2.7 compared to baseline of 1.67 and BUN is elevated at 79 Troponin 82 CBC reviewed and within normal limits Chest x-ray reviewed and shows hyperinflated lung fields CT scan of the head without contrast shows no acute intracranial abnormalities.  Chronic small vessel ischemic disease and brain atrophy. Twelve-lead EKG reviewed by me shows sinus rhythm.  LVH.  T wave abnormalities in the lateral leads.  There are no new results to review at this time.  Assessment and Plan: Principal Problem:   Syncope and collapse Active Problems:   Dementia with behavioral disturbance   Acute kidney injury (HCC)   1st degree AV block     Syncope and collapse Most likely secondary to volume depletion from poor oral intake versus symptomatic bradycardia Orthostatic blood pressure checks Patient had a recent 2D echocardiogram which showed an LVEF of 60 to 65% with no evidence of aortic stenosis We will consult cardiology    AKI Secondary to poor oral intake Patient has a baseline serum creatinine of 1.67 and today on admission it is 2.7 Continue IV fluid hydration Repeat renal parameters in a.m.     Dementia with behavioral disturbances Continue Depakote and citalopram    Hypertension Blood pressure stable Continue amlodipine and hydralazine  Advance Care Planning:   Code Status: Full Code    Consults: Cardiology  Family Communication: Greater than 50% of time was spent discussing patient's condition and plan of care with his son over the phone.  All questions and concerns have been addressed.  CODE STATUS was discussed and patient is full code  Severity of Illness: The appropriate patient status for this patient is OBSERVATION. Observation status is judged to be reasonable and necessary in order to provide the required intensity of service to ensure the patient's safety. The patient's presenting symptoms, physical exam findings, and initial radiographic and laboratory data in the context of their medical condition is felt to place them at decreased risk for further clinical deterioration. Furthermore, it is anticipated that  the patient will be medically stable for discharge from the hospital within 2 midnights of admission.   Author: Collier Bullock, MD 07/30/2021 4:39 PM  For on call review [email or web address].

## 2021-07-30 NOTE — Plan of Care (Signed)
°  Problem: Clinical Measurements: Goal: Cardiovascular complication will be avoided Outcome: Progressing   Problem: Nutrition: Goal: Adequate nutrition will be maintained Outcome: Progressing   Problem: Safety: Goal: Ability to remain free from injury will improve Outcome: Progressing   Problem: Coping: Goal: Level of anxiety will decrease 07/30/2021 2239 by Liliane Channel, RN Outcome: Progressing 07/30/2021 2236 by Liliane Channel, RN Outcome: Progressing   Problem: Pain Managment: Goal: General experience of comfort will improve Outcome: Progressing

## 2021-07-30 NOTE — ED Triage Notes (Signed)
Pt ems from home for AMS and syncopal episode. Pt states that he was dizzy, does not remember anything else. Pt says he feels at baseline. Pt is GCS 14. Follows commands.

## 2021-07-30 NOTE — Progress Notes (Signed)
Unable to complete screening assessment pt disoriented x 4. Will notify incoming shift. Will continue to monitor.

## 2021-07-30 NOTE — ED Provider Notes (Signed)
[facility] Provider Note    Event Date/Time   First MD Initiated Contact with Patient 07/30/21 1407     (approximate)   History   Syncope  HPI  EM caveat: Some limitation due to dementia at baseline Dillon Schultz is a 76 y.o. male reports he fell, "woozy" today.  He thinks he might of passed out.  He does not recall well  Further history obtained from EMS, who also discussed with the patient's son.  They were called out for a syncopal episode witnessed by family.  EMS reports he is primarily communicated to them through hand gestures.  But he is not had any distress his vital signs of been stable.  Report that he had a syncopal episode but was not witnessed by EMS  He was recently discharged from the hospital, and EMS reports he still has his hospital bracelets on for which she was treated for dehydration and possible urinary tract infection from what they were told  Patient himself verbalizes to me without difficulty, tells me he is in no pain or discomfort.  Just felt that he felt "woozy" earlier     Physical Exam   Triage Vital Signs: ED Triage Vitals  Enc Vitals Group     BP      Pulse      Resp      Temp      Temp src      SpO2      Weight      Height      Head Circumference      Peak Flow      Pain Score      Pain Loc      Pain Edu?      Excl. in [place]?     Most recent vital signs: Vitals:   07/30/21 1424 07/30/21 1430  BP: (!) 143/81 (!) 143/88  Pulse: (!) 56 (!) 52  Resp: 10 11  Temp: 97.9 F (36.6 C)   SpO2: 100% 100%     General: Awake, no distress.  He is oriented to self and being in a hospital and to the fact that it is cold in winter outside.  He is not oriented to year or date though CV:  Good peripheral perfusion.  Normal heart tones and no murmurs or irregularities noted Resp:  Normal effort.  Clear lung sounds bilateral without distress Abd:  No distention.  Abdomen soft nontender nondistended all  quadrant Other:  Moderate bilateral lower extremity lymphedema, no erythema redness or skin breakdown to noted except for a small area bandaged ulceration of the left anterior shin. Moves all extremities equally.  5 out of 5 strength in all extremities.  Equal facial smile and grip strength.  No neck pain.     ED Results / Procedures / Treatments   Labs (all labs ordered are listed, but only abnormal results are displayed) Labs Reviewed  CBC WITH DIFFERENTIAL/PLATELET - Abnormal; Notable for the following components:      Result Value   Platelets 149 (*)    Lymphs Abs 0.6 (*)    All other components within normal limits  COMPREHENSIVE METABOLIC PANEL - Abnormal; Notable for the following components:   BUN 79 (*)    Creatinine, Ser 2.77 (*)    GFR, Estimated 23 (*)    All other components within normal limits  TROPONIN I (HIGH SENSITIVITY) - Abnormal; Notable for the following components:   Troponin I (High Sensitivity) 82 (*)  All other components within normal limits  RESP PANEL BY RT-PCR (FLU A&B, COVID) ARPGX2  BRAIN NATRIURETIC PEPTIDE  URINALYSIS, COMPLETE (UACMP) WITH MICROSCOPIC  TROPONIN I (HIGH SENSITIVITY)     EKG  Reviewed and interpreted by me at 1420 Heart rate 55 9 QRS 99 QTc 445 Normal sinus rhythm, ST segment abnormality consistent with LVH with repolarization abnormality.  No frank ischemic findings to noted   RADIOLOGY   CT of the head personally viewed by me, I do not see evidence of acute gross abnormality   PROCEDURES:  Critical Care performed: No  Procedures   MEDICATIONS ORDERED IN ED: Medications  lactated ringers bolus 1,000 mL (has no administration in time range)     IMPRESSION / MDM / ASSESSMENT AND PLAN / ED COURSE  I reviewed the triage vital signs and the nursing notes.                              Differential diagnosis includes, but is not limited to, various etiologies of syncope.  Given his recent history and I  reviewed his previous admission for which she was discharged on January 31 for dehydration AKI hyperkalemia had altered mental status delirium and agitation at that time.  Patient appears to be alert oriented to self calm not combative not obviously showing any signs of delirium here.  Concern patient had a syncopal episode.  does not show obvious evidence CHF on clinical exam at this time.  Differential diagnosis certainly includes ACS arrhythmia electrolyte abnormality anemia etc.  Doubt acute neurologic etiology.  Appears to be baseline, verbalizes well perfused uses hands but when asked to speak he speaks easily fluently and clearly.   Labs reviewed to this point include normal CBC.  Troponin elevated at 82, but compared with previous this appears to be in keeping with previous troponin levels.  Labs reviewed elevated creatinine from his previous, but somewhat hard to distinguish what his actual baseline creatinine his though BUN is significantly elevated  Clinical Course as of 07/30/21 1544  Sun Jul 30, 2021  1506 Personally viewed and interpreted the patient's CT scan of the head, I do not see evidence of acute gross pathology such as intracranial hemorrhage.  Await radiologist final read [MQ]    Clinical Course User Index [MQ] Delman Kitten, MD   ----------------------------------------- 3:15 PM on 07/30/2021 ----------------------------------------- Ongoing care assigned to Dr. Charna Archer.  Follow-up on remaining labs, anticipate admission given the patient's clinical history and syncopal episode.  Possibly secondary to AKI / dehyrration, though exact etiology is unclear at this time   FINAL CLINICAL IMPRESSION(S) / ED DIAGNOSES   Final diagnoses:  Uremia  Dehydration  Syncope and collapse     Rx / DC Orders   ED Discharge Orders     None        Note:  This document was prepared using Dragon voice recognition software and may include unintentional dictation errors.    Delman Kitten, MD 07/30/21 1544

## 2021-07-31 ENCOUNTER — Encounter: Payer: Self-pay | Admitting: Internal Medicine

## 2021-07-31 DIAGNOSIS — I951 Orthostatic hypotension: Secondary | ICD-10-CM | POA: Diagnosis present

## 2021-07-31 DIAGNOSIS — F03918 Unspecified dementia, unspecified severity, with other behavioral disturbance: Secondary | ICD-10-CM | POA: Diagnosis present

## 2021-07-31 DIAGNOSIS — E440 Moderate protein-calorie malnutrition: Secondary | ICD-10-CM | POA: Diagnosis present

## 2021-07-31 DIAGNOSIS — I130 Hypertensive heart and chronic kidney disease with heart failure and stage 1 through stage 4 chronic kidney disease, or unspecified chronic kidney disease: Secondary | ICD-10-CM | POA: Diagnosis present

## 2021-07-31 DIAGNOSIS — N179 Acute kidney failure, unspecified: Principal | ICD-10-CM

## 2021-07-31 DIAGNOSIS — E860 Dehydration: Secondary | ICD-10-CM | POA: Diagnosis present

## 2021-07-31 DIAGNOSIS — E162 Hypoglycemia, unspecified: Secondary | ICD-10-CM | POA: Diagnosis present

## 2021-07-31 DIAGNOSIS — Z888 Allergy status to other drugs, medicaments and biological substances status: Secondary | ICD-10-CM | POA: Diagnosis not present

## 2021-07-31 DIAGNOSIS — Z79899 Other long term (current) drug therapy: Secondary | ICD-10-CM | POA: Diagnosis not present

## 2021-07-31 DIAGNOSIS — R001 Bradycardia, unspecified: Secondary | ICD-10-CM | POA: Diagnosis present

## 2021-07-31 DIAGNOSIS — Z87891 Personal history of nicotine dependence: Secondary | ICD-10-CM | POA: Diagnosis not present

## 2021-07-31 DIAGNOSIS — M1990 Unspecified osteoarthritis, unspecified site: Secondary | ICD-10-CM | POA: Diagnosis present

## 2021-07-31 DIAGNOSIS — E785 Hyperlipidemia, unspecified: Secondary | ICD-10-CM | POA: Diagnosis present

## 2021-07-31 DIAGNOSIS — I5032 Chronic diastolic (congestive) heart failure: Secondary | ICD-10-CM | POA: Diagnosis present

## 2021-07-31 DIAGNOSIS — R55 Syncope and collapse: Secondary | ICD-10-CM | POA: Diagnosis present

## 2021-07-31 DIAGNOSIS — Z6821 Body mass index (BMI) 21.0-21.9, adult: Secondary | ICD-10-CM | POA: Diagnosis not present

## 2021-07-31 DIAGNOSIS — N184 Chronic kidney disease, stage 4 (severe): Secondary | ICD-10-CM | POA: Diagnosis present

## 2021-07-31 DIAGNOSIS — I440 Atrioventricular block, first degree: Secondary | ICD-10-CM

## 2021-07-31 DIAGNOSIS — Z20822 Contact with and (suspected) exposure to covid-19: Secondary | ICD-10-CM | POA: Diagnosis present

## 2021-07-31 DIAGNOSIS — Z7982 Long term (current) use of aspirin: Secondary | ICD-10-CM | POA: Diagnosis not present

## 2021-07-31 LAB — GLUCOSE, CAPILLARY
Glucose-Capillary: 108 mg/dL — ABNORMAL HIGH (ref 70–99)
Glucose-Capillary: 128 mg/dL — ABNORMAL HIGH (ref 70–99)
Glucose-Capillary: 138 mg/dL — ABNORMAL HIGH (ref 70–99)
Glucose-Capillary: 220 mg/dL — ABNORMAL HIGH (ref 70–99)
Glucose-Capillary: 33 mg/dL — CL (ref 70–99)
Glucose-Capillary: 54 mg/dL — ABNORMAL LOW (ref 70–99)
Glucose-Capillary: 69 mg/dL — ABNORMAL LOW (ref 70–99)
Glucose-Capillary: 83 mg/dL (ref 70–99)

## 2021-07-31 LAB — BASIC METABOLIC PANEL
Anion gap: 7 (ref 5–15)
BUN: 70 mg/dL — ABNORMAL HIGH (ref 8–23)
CO2: 26 mmol/L (ref 22–32)
Calcium: 9.1 mg/dL (ref 8.9–10.3)
Chloride: 104 mmol/L (ref 98–111)
Creatinine, Ser: 2.22 mg/dL — ABNORMAL HIGH (ref 0.61–1.24)
GFR, Estimated: 30 mL/min — ABNORMAL LOW (ref >60)
Glucose, Bld: 106 mg/dL — ABNORMAL HIGH (ref 70–99)
Potassium: 4.4 mmol/L (ref 3.5–5.1)
Sodium: 137 mmol/L (ref 135–145)

## 2021-07-31 LAB — CBC
HCT: 33.9 % — ABNORMAL LOW (ref 39.0–52.0)
Hemoglobin: 11.1 g/dL — ABNORMAL LOW (ref 13.0–17.0)
MCH: 29.5 pg (ref 26.0–34.0)
MCHC: 32.7 g/dL (ref 30.0–36.0)
MCV: 90.2 fL (ref 80.0–100.0)
Platelets: 121 10*3/uL — ABNORMAL LOW (ref 150–400)
RBC: 3.76 MIL/uL — ABNORMAL LOW (ref 4.22–5.81)
RDW: 13.1 % (ref 11.5–15.5)
WBC: 4.9 10*3/uL (ref 4.0–10.5)
nRBC: 0 % (ref 0.0–0.2)

## 2021-07-31 LAB — GLUCOSE, RANDOM: Glucose, Bld: 111 mg/dL — ABNORMAL HIGH (ref 70–99)

## 2021-07-31 MED ORDER — DEXTROSE 50 % IV SOLN
12.5000 g | Freq: Once | INTRAVENOUS | Status: AC
  Administered 2021-07-31: 12.5 g via INTRAVENOUS
  Filled 2021-07-31: qty 50

## 2021-07-31 MED ORDER — ENSURE ENLIVE PO LIQD
237.0000 mL | Freq: Three times a day (TID) | ORAL | Status: DC
  Administered 2021-07-31 – 2021-08-05 (×8): 237 mL via ORAL

## 2021-07-31 MED ORDER — DEXTROSE IN LACTATED RINGERS 5 % IV SOLN: INTRAVENOUS | Status: DC

## 2021-07-31 MED ORDER — ADULT MULTIVITAMIN W/MINERALS CH
1.0000 | ORAL_TABLET | Freq: Every day | ORAL | Status: DC
  Administered 2021-07-31 – 2021-08-05 (×6): 1 via ORAL
  Filled 2021-07-31 (×6): qty 1

## 2021-07-31 NOTE — Progress Notes (Addendum)
Hypoglycemic Event  CBG: 41  Treatment: 4 oz juice/soda  Symptoms: None  Follow-up CBG: Time:0036 CBG Result: 69  Possible Reasons for Event: Inadequate meal intake  Comments/MD notified:0006 Neomia Glass NP. NP Foust added every 4 hours blood sugar check. Will continue to monitor.  Update 0057: NP Foust ordered 12.5 g of D50%  IV once. Administer at [place]. Blood sugar went up 220 at 0214 NP Foust made aware. Will continue to monitor.  Update 0118: Glucose random resulted at 111. NP Foust made aware. Will continue to monitor.    Dillon Schultz

## 2021-07-31 NOTE — Progress Notes (Signed)
PT Cancellation Note  Patient Details Name: Dillon Schultz MRN: [id number] DOB: [birth year]   Cancelled Treatment:    Reason Eval/Treat Not Completed: Patient declined, no reason specified. Upon entering room patient was asleep. Aroused to voice and tactile cueing. Author attempted max encouragement to work with physical therapy, however patient kept pulling covers up over his face. Due to patient's known aggressive behavior and patient refusing calmly, will re-attempt at a later time/date as available and patient medically appropriate for PT. Thank you!   Iva Boop, PT  07/31/21. 1:06 PM

## 2021-07-31 NOTE — Progress Notes (Deleted)
Hypoglycemic Event  CBG: 41  Treatment: 8 oz juice/soda  Symptoms: None  Follow-up CBG: Time:0 CBG Result:0100  Possible Reasons for Event: Inadequate meal intake  Comments/MD notified:0002    Dillon Schultz [name]

## 2021-07-31 NOTE — Progress Notes (Addendum)
Progress Note    Dillon Schultz  CXK:[id number] DOB: August 13, 1945  DOA: 07/30/2021 PCP: [name], MD      Brief Narrative:    Medical records reviewed and are as summarized below:  Dillon Schultz is a 76 y.o. male with medical history significant for recent discharge from the hospital on 07/25/2021, dementia with behavioral disturbances, hypertension, dyslipidemia, chronic diastolic CHF, first-degree AV block.,  CKD stage IV.  He presented to the hospital because of witnessed syncopal episode.  Reportedly, since he was discharged home, he had refused to eat or drink.  He was found to have AKI and he also had recurrent hypoglycemic episodes in the hospital.      Assessment/Plan:   Principal Problem:   Syncope and collapse Active Problems:   Dementia with behavioral disturbance   Acute kidney injury ([place])   1st degree AV block     Body mass index is 21.35 kg/m.   AKI: Continue IV fluids, monitor BMP  S/p syncope: Probably from dehydration and AKI.  Recurrent hypoglycemia: Likely from poor oral intake.  Hydrate with dextrose containing IV fluids containing dextrose  Dementia with behavioral disturbance: Continue Depakote and sertraline.  Continue supportive care  Generalized weakness: PT and OT evaluation  Sinus bradycardia, first-degree AV block, hypertension     Diet Order             Diet regular Room service appropriate? Yes; Fluid consistency: Thin  Diet effective now                        Consultants: None  Procedures: None    Medications:    amLODipine  10 mg Oral Daily   aspirin  81 mg Oral Daily   citalopram  40 mg Oral Daily   divalproex  125 mg Oral Q12H   enoxaparin (LOVENOX) injection  40 mg Subcutaneous Q24H   feeding supplement  237 mL Oral TID BM   gabapentin  100 mg Oral TID   hydrALAZINE  25 mg Oral BID   mouth rinse  15 mL Mouth Rinse BID   multivitamin with minerals  1 tablet Oral Daily   sodium chloride  flush  3 mL Intravenous Q12H   Continuous Infusions:  dextrose 5% lactated ringers 75 mL/hr at 07/31/21 1000     Anti-infectives (From admission, onward)    None              Family Communication/Anticipated D/C date and plan/Code Status   DVT prophylaxis: enoxaparin (LOVENOX) injection 40 mg Start: 07/30/21 2200     Code Status: Full Code  Family Communication: His wife and son, [name], over the phone Disposition Plan: Possible discharge home in 1 to 2 days   Status is: Inpatient Remains inpatient appropriate because: IV fluids, altered mental status                       Subjective:   Interval events noted.  He is confused and unable to provide any history  Objective:    Vitals:   07/31/21 0428 07/31/21 0726 07/31/21 0900 07/31/21 1106  BP: 127/85 128/70  120/69  Pulse: (!) 55 (!) 53    Resp: 18 19 17 19   Temp: 97.8 F (36.6 C) 98.3 F (36.8 C)  98.3 F (36.8 C)  TempSrc: Axillary     SpO2: 100% 99%  99%  Weight:      Height:  No data found.   Intake/Output Summary (Last 24 hours) at 07/31/2021 1332 Last data filed at 07/31/2021 1000 Gross per 24 hour  Intake 1247.16 ml  Output --  Net 1247.16 ml   Filed Weights   07/30/21 1426 07/30/21 1731 07/31/21 0217  Weight: 70 kg 63.1 kg 63.7 kg    Exam:  GEN: NAD SKIN: Warm and dry chronic wound on the left leg.  Healed wounds on the bilateral knees. EYES: EOMI ENT: MMM CV: RRR PULM: CTA B ABD: soft, ND, NT, +BS CNS: Drowsy but arousable, he is oriented to person only. EXT: No edema or tenderness        Data Reviewed:   I have personally reviewed following labs and imaging studies:  Labs: Labs show the following:   Basic Metabolic Panel: Recent Labs  Lab 07/25/21 0539 07/30/21 1431 07/31/21 0046  NA 144 136 137  K 4.4 4.2 4.4  CL 108 102 104  CO2 25 26 26   GLUCOSE 96 93 106*   111*  BUN 48* 79* 70*  CREATININE 1.67* 2.77* 2.22*  CALCIUM 9.9 9.6  9.1   GFR Estimated Creatinine Clearance: 25.1 mL/min (A) (by C-G formula based on SCr of 2.22 mg/dL (H)). Liver Function Tests: Recent Labs  Lab 07/30/21 1431  AST 23  ALT 14  ALKPHOS 81  BILITOT 0.9  PROT 7.7  ALBUMIN 3.9   No results for input(s): LIPASE, AMYLASE in the last 168 hours. No results for input(s): AMMONIA in the last 168 hours. Coagulation profile No results for input(s): INR, PROTIME in the last 168 hours.  CBC: Recent Labs  Lab 07/25/21 0539 07/30/21 1431 07/31/21 0401  WBC 7.3 5.5 4.9  NEUTROABS  --  4.3  --   HGB 12.7* 13.3 11.1*  HCT 38.3* 41.2 33.9*  MCV 91.2 91.6 90.2  PLT 179 149* 121*   Cardiac Enzymes: No results for input(s): CKTOTAL, CKMB, CKMBINDEX, TROPONINI in the last 168 hours. BNP (last 3 results) No results for input(s): PROBNP in the last 8760 hours. CBG: Recent Labs  Lab 07/31/21 0214 07/31/21 0421 07/31/21 1049 07/31/21 1123 07/31/21 1245  GLUCAP 220* 138* 54* 83 108*   D-Dimer: No results for input(s): DDIMER in the last 72 hours. Hgb A1c: No results for input(s): HGBA1C in the last 72 hours. Lipid Profile: No results for input(s): CHOL, HDL, LDLCALC, TRIG, CHOLHDL, LDLDIRECT in the last 72 hours. Thyroid function studies: Recent Labs    07/30/21 1745  TSH 1.220   Anemia work up: No results for input(s): VITAMINB12, FOLATE, FERRITIN, TIBC, IRON, RETICCTPCT in the last 72 hours. Sepsis Labs: Recent Labs  Lab 07/25/21 0539 07/30/21 1431 07/31/21 0401  WBC 7.3 5.5 4.9    Microbiology Recent Results (from the past 240 hour(s))  Resp Panel by RT-PCR (Flu A&B, Covid) Nasopharyngeal Swab     Status: None   Collection Time: 07/30/21  2:32 PM   Specimen: Nasopharyngeal Swab; Nasopharyngeal(NP) swabs in vial transport medium  Result Value Ref Range Status   SARS Coronavirus 2 by RT PCR NEGATIVE NEGATIVE Final    Comment: (NOTE) SARS-CoV-2 target nucleic acids are NOT DETECTED.  The SARS-CoV-2 RNA is generally  detectable in upper respiratory specimens during the acute phase of infection. The lowest concentration of SARS-CoV-2 viral copies this assay can detect is 138 copies/mL. A negative result does not preclude SARS-Cov-2 infection and should not be used as the sole basis for treatment or other patient management decisions. A negative result may  occur with  improper specimen collection/handling, submission of specimen other than nasopharyngeal swab, presence of viral mutation(s) within the areas targeted by this assay, and inadequate number of viral copies(<138 copies/mL). A negative result must be combined with clinical observations, patient history, and epidemiological information. The expected result is Negative.  Fact Sheet for Patients:  EntrepreneurPulse.com.au  Fact Sheet for Healthcare Providers:  IncredibleEmployment.be  This test is no t yet approved or cleared by the [place] FDA and  has been authorized for detection and/or diagnosis of SARS-CoV-2 by FDA under an Emergency Use Authorization (EUA). This EUA will remain  in effect (meaning this test can be used) for the duration of the COVID-19 declaration under Section 564(b)(1) of the Act, 21 U.S.C.section 360bbb-3(b)(1), unless the authorization is terminated  or revoked sooner.       Influenza A by PCR NEGATIVE NEGATIVE Final   Influenza B by PCR NEGATIVE NEGATIVE Final    Comment: (NOTE) The Xpert Xpress SARS-CoV-2/FLU/RSV plus assay is intended as an aid in the diagnosis of influenza from Nasopharyngeal swab specimens and should not be used as a sole basis for treatment. Nasal washings and aspirates are unacceptable for Xpert Xpress SARS-CoV-2/FLU/RSV testing.  Fact Sheet for Patients: EntrepreneurPulse.com.au  Fact Sheet for Healthcare Providers: IncredibleEmployment.be  This test is not yet approved or cleared by the [place] FDA  and has been authorized for detection and/or diagnosis of SARS-CoV-2 by FDA under an Emergency Use Authorization (EUA). This EUA will remain in effect (meaning this test can be used) for the duration of the COVID-19 declaration under Section 564(b)(1) of the Act, 21 U.S.C. section 360bbb-3(b)(1), unless the authorization is terminated or revoked.  Performed at [facility], [address], [place], [place] [address]     Procedures and diagnostic studies:  CT Head Wo Contrast  Result Date: 07/30/2021 CLINICAL DATA:  Altered mental status. Loss of consciousness. Fall. EXAM: CT HEAD WITHOUT CONTRAST TECHNIQUE: Contiguous axial images were obtained from the base of the skull through the vertex without intravenous contrast. RADIATION DOSE REDUCTION: This exam was performed according to the departmental dose-optimization program which includes automated exposure control, adjustment of the mA and/or kV according to patient size and/or use of iterative reconstruction technique. COMPARISON:  07/21/2021 FINDINGS: Brain: No evidence of acute infarction, hemorrhage, hydrocephalus, extra-axial collection or mass lesion/mass effect. There is mild diffuse low-attenuation within the subcortical and periventricular white matter compatible with chronic microvascular disease. Prominence of the sulci and the ventricles compatible with brain atrophy. Vascular: No hyperdense vessel or unexpected calcification. Skull: Normal. Negative for fracture or focal lesion. Sinuses/Orbits: No acute finding. Other: None. IMPRESSION: 1. No acute intracranial abnormalities. 2. Chronic small vessel ischemic disease and brain atrophy. Electronically Signed   By: Kerby Moors M.D.   On: 07/30/2021 15:08   MR BRAIN WO CONTRAST  Result Date: 07/30/2021 CLINICAL DATA:  Dizziness EXAM: MRI HEAD WITHOUT CONTRAST TECHNIQUE: Multiplanar, multiecho pulse sequences of the brain and surrounding structures were obtained without  intravenous contrast. COMPARISON:  No prior MRI head, correlation is made with CT head 07/30/2021 FINDINGS: Brain: No restricted diffusion to suggest acute or subacute infarct. No acute hemorrhage, mass, mass effect, or midline shift. No hydrocephalus or extra-axial collection. Ventriculomegaly, likely secondary to advanced cerebral atrophy for age. Confluent T2 hyperintense signal in the periventricular white matter, likely the sequela of severe chronic small vessel ischemic disease. Vascular: Normal flow voids. Skull and upper cervical spine: Normal marrow signal. Sinuses/Orbits: Negative. Other: None. IMPRESSION:  No acute intracranial process. Cerebral atrophy, which appears advanced for age. Electronically Signed   By: Merilyn Baba M.D.   On: 07/30/2021 21:47   DG Chest Portable 1 View  Result Date: 07/30/2021 CLINICAL DATA:  Syncope.  History of CHF. EXAM: PORTABLE CHEST 1 VIEW COMPARISON:  07/20/2021 FINDINGS: Heart size is normal. Lungs appear hyperinflated but clear. There is no pleural effusion or edema. No airspace opacities identified. Review of the visualized osseous structures is unremarkable. IMPRESSION: No active cardiopulmonary abnormalities. Electronically Signed   By: Kerby Moors M.D.   On: 07/30/2021 14:38               LOS: 0 days   Mckale Haffey  [facility]   Pager on [email or web address]. If 7PM-7AM, please contact night-coverage at [email or web address]     07/31/2021, 1:32 PM

## 2021-07-31 NOTE — Progress Notes (Signed)
Initial Nutrition Assessment  DOCUMENTATION CODES:   Non-severe (moderate) malnutrition in context of chronic illness  INTERVENTION:   -Ensure Enlive po TID, each supplement provides 350 kcal and 20 grams of protein.  -MVI with minerals daily -Liberalize diet to regular for increased variety of food selections   NUTRITION DIAGNOSIS:   Moderate Malnutrition related to chronic illness (dementia) as evidenced by mild fat depletion, moderate fat depletion, moderate muscle depletion, severe muscle depletion, percent weight loss.  GOAL:   Patient will meet greater than or equal to 90% of their needs  MONITOR:   PO intake, Supplement acceptance, Labs, Weight trends, Skin, I & O's  REASON FOR ASSESSMENT:   Malnutrition Screening Tool    ASSESSMENT:   Dillon Schultz is a 76 y.o. male with medical history significant for recent discharge from the hospital on 07/25/2021, dementia with behavioral disturbances, hypertension, dyslipidemia, chronic diastolic CHF, first-degree AV block.,  CKD stage IV.  He presented to the hospital because of witnessed syncopal episode.  Reportedly, since he was discharged home, he had refused to eat or drink.  Pt admitted with AKI and syncope and collapse.   Reviewed I/O's: +1.2 L x 24 hours  Spoke with pt at bedside, who reports feeling better today. He was sitting up, drinking orange juice at time of visit. Noted pt consumed about 20% of pancakes and sausage- pt shares "it is ok, but it is not toast and a fried egg, which I am used to".   Pt reports good appetite PTA, consuming 3 meals per day (Breakfast: toast, egg, and bacon; Lunch and dinner: meat, starch, and vegetable). RD questions accuracy of this statement. Pt son reports in H&P that pt with very poor oral intake over the past 5 days PTA.    Pt unsure if he has lost weight. He is unsure of UBW. Reviewed wt hx; pt has experienced a 17.4% wt loss over the past 8 months, which is significant for time  frame.   Discussed importance of good meal and supplement intake to promote healing.   Pt with malnutrition and poor oral intake; RD will liberalize diet for increased variety of food selections.   Medications reviewed and dextrose 5% in lactated ringers infusion @ 75 ml/hr.   Labs reviewed: CBGS: 54-220.  NUTRITION - FOCUSED PHYSICAL EXAM:  Flowsheet Row Most Recent Value  Orbital Region Mild depletion  Upper Arm Region Moderate depletion  Thoracic and Lumbar Region Severe depletion  Buccal Region Moderate depletion  Temple Region Moderate depletion  Clavicle Bone Region Severe depletion  Clavicle and Acromion Bone Region Moderate depletion  Scapular Bone Region Severe depletion  Dorsal Hand No depletion  Patellar Region No depletion  Anterior Thigh Region No depletion  Posterior Calf Region No depletion  Edema (RD Assessment) Moderate  Hair Reviewed  Eyes Reviewed  Mouth Reviewed  Skin Reviewed  Nails Reviewed       Diet Order:   Diet Order             Diet regular Room service appropriate? Yes; Fluid consistency: Thin  Diet effective now                   EDUCATION NEEDS:   Education needs have been addressed  Skin:  Skin Assessment: Skin Integrity Issues: Skin Integrity Issues:: Other (Comment) Other: opne scab to lt pretibial  Last BM:  Unknown  Height:   Ht Readings from Last 1 Encounters:  07/30/21 5\' 8"  (1.727 m)  Weight:   Wt Readings from Last 1 Encounters:  07/31/21 63.7 kg    Ideal Body Weight:  70 kg  BMI:  Body mass index is 21.35 kg/m.  Estimated Nutritional Needs:   Kcal:  1900-2100  Protein:  95-110 grams  Fluid:  > 1.9 L    Loistine Chance, RD, LDN, [place] Registered Dietitian II Certified Diabetes Care and Education Specialist Please refer to [facility] for RD and/or RD on-call/weekend/after hours pager

## 2021-07-31 NOTE — Evaluation (Signed)
Occupational Therapy Evaluation Patient Details Name: Dillon Schultz MRN: [id number] DOB: May 05, 1946 Today's Date: 07/31/2021   History of Present Illness Dillon Schultz is a 76 y.o. male with medical history significant for recent discharge from the hospital on 07/25/2021, dementia with behavioral disturbances, hypertension, dyslipidemia, chronic diastolic CHF, first-degree AV block.,  CKD stage IV.  He presented to the hospital because of witnessed syncopal episode.  Reportedly, since he was discharged home, he had refused to eat or drink.   Clinical Impression   [name] was seen for OT evaluation this date. Pt poor historian, unable to provide setup or PLOF however per chart pt recently d/c from hospital, lives with family. Pt presents to acute OT demonstrating impaired ADL performance and functional mobility 2/2 decreased activity tolerance, poor command following, and functional strength/ROM/balance deficits. Pt does not answer verbalize during sessions, inconsistently shakes/nods head. Follows 1 step commands inconsistently.  Upon arrival nursing student administering medication - pt noted to be pocketing. MAX A self-drinking seated EOB and cues to spit out pocketed food. MAX A don R sock seated EOB - appears in pain with attempts to don R sock and terminated attempt. MAX A bed mobility however once seated EOB fair balance with no UE or LE support. Pt would benefit from skilled OT to address noted impairments and functional limitations (see below for any additional details). Upon hospital discharge, recommend STR to maximize pt safety and return to PLOF.       Recommendations for follow up therapy are one component of a multi-disciplinary discharge planning process, led by the attending physician.  Recommendations may be updated based on patient status, additional functional criteria and insurance authorization.   Follow Up Recommendations  Skilled nursing-short term rehab (<3 hours/day)     Assistance Recommended at Discharge Frequent or constant Supervision/Assistance  Patient can return home with the following Two people to help with bathing/dressing/bathroom;Assistance with cooking/housework;Assistance with feeding;Help with stairs or ramp for entrance;Assist for transportation;Direct supervision/assist for financial management;Direct supervision/assist for medications management;Two people to help with walking and/or transfers    Functional Status Assessment  Patient has had a recent decline in their functional status and demonstrates the ability to make significant improvements in function in a reasonable and predictable amount of time.  Equipment Recommendations  BSC/3in1    Recommendations for Other Services       Precautions / Restrictions Precautions Precautions: Fall Restrictions Weight Bearing Restrictions: No      Mobility Bed Mobility Overal bed mobility: Needs Assistance Bed Mobility: Supine to Sit, Sit to Supine     Supine to sit: Max assist, HOB elevated Sit to supine: Max assist, HOB elevated        Transfers                   General transfer comment: deferred in setting of decreased cognition/command following      Balance Overall balance assessment: Needs assistance Sitting-balance support: No upper extremity supported, Feet unsupported Sitting balance-Leahy Scale: Fair                                     ADL either performed or assessed with clinical judgement   ADL Overall ADL's : Needs assistance/impaired  General ADL Comments: MAX A self-drinking at bed level, pt noted to be pocketing and assisted to spit out food. MAX A don R sock seated EOB - appears in pain with attempts to don R sock and terminated attempt.      Pertinent Vitals/Pain Pain Assessment Pain Assessment: Faces Faces Pain Scale: Hurts little more Pain Location: L foot to  touch Pain Descriptors / Indicators: Discomfort, Guarding, Grimacing Pain Intervention(s): Limited activity within patient's tolerance, Repositioned     Hand Dominance     Extremity/Trunk Assessment Upper Extremity Assessment Upper Extremity Assessment: Generalized weakness   Lower Extremity Assessment Lower Extremity Assessment: Generalized weakness;Difficult to assess due to impaired cognition       Communication Communication Communication: Other (comment) (does not verbalize during session - per chart and NT in room pt was talking earlier this date)   Cognition Arousal/Alertness: Lethargic Behavior During Therapy: Flat affect Overall Cognitive Status: No family/caregiver present to determine baseline cognitive functioning                                 General Comments: Does not answer questions or verbalize. Follows 1 step commands inconsistently.     General Comments  RUE and LLE swollen - RN notified            Home Living Family/patient expects to be discharged to:: Private residence Living Arrangements: Spouse/significant other;Other relatives Available Help at Discharge: Family   Home Access: Stairs to enter Entrance Stairs-Number of Steps: 2   Home Layout: One level                   Additional Comments: unable to verify      Prior Functioning/Environment Prior Level of Function : Patient poor historian/Family not available             Mobility Comments: no family present to report          OT Problem List: Decreased strength;Pain;Decreased cognition;Decreased safety awareness;Decreased activity tolerance;Impaired balance (sitting and/or standing);Decreased knowledge of use of DME or AE      OT Treatment/Interventions: Self-care/ADL training;Therapeutic exercise;Therapeutic activities;Cognitive remediation/compensation;DME and/or AE instruction;Balance training;Patient/family education    OT Goals(Current goals can be  found in the care plan section) Acute Rehab OT Goals Patient Stated Goal: to not be left alone OT Goal Formulation: With patient Time For Goal Achievement: 08/14/21 Potential to Achieve Goals: Fair ADL Goals Pt Will Perform Grooming: with supervision;sitting;with set-up Pt Will Perform Lower Body Dressing: with min assist;sitting/lateral leans Pt Will Transfer to Toilet: with mod assist;squat pivot transfer;bedside commode  OT Frequency: Min 2X/week    Co-evaluation              AM-PAC OT "6 Clicks" Daily Activity     Outcome Measure Help from another person eating meals?: A Lot Help from another person taking care of personal grooming?: A Lot Help from another person toileting, which includes using toliet, bedpan, or urinal?: A Lot Help from another person bathing (including washing, rinsing, drying)?: A Lot Help from another person to put on and taking off regular upper body clothing?: A Lot Help from another person to put on and taking off regular lower body clothing?: A Lot 6 Click Score: 12   End of Session Equipment Utilized During Treatment: Oxygen Nurse Communication: Mobility status;Other (comment) (spit out pill, LLE swollen and painful)  Activity Tolerance: Patient limited by lethargy Patient left: in bed;with call  bell/phone within reach;with bed alarm set;with nursing/sitter in room  OT Visit Diagnosis: Other abnormalities of gait and mobility (R26.89);Other symptoms and signs involving cognitive function;Pain                Time: 6433-2951 OT Time Calculation (min): 22 min Charges:  OT General Charges $OT Visit: 1 Visit OT Evaluation $OT Eval Moderate Complexity: 1 Mod OT Treatments $Self Care/Home Management : 8-22 mins  Dessie Coma, M.S. OTR/L  07/31/21, 3:42 PM  ascom [phone]

## 2021-08-01 DIAGNOSIS — I440 Atrioventricular block, first degree: Secondary | ICD-10-CM | POA: Diagnosis not present

## 2021-08-01 DIAGNOSIS — R55 Syncope and collapse: Secondary | ICD-10-CM | POA: Diagnosis not present

## 2021-08-01 DIAGNOSIS — F03918 Unspecified dementia, unspecified severity, with other behavioral disturbance: Secondary | ICD-10-CM | POA: Diagnosis not present

## 2021-08-01 DIAGNOSIS — N179 Acute kidney failure, unspecified: Secondary | ICD-10-CM | POA: Diagnosis not present

## 2021-08-01 DIAGNOSIS — E440 Moderate protein-calorie malnutrition: Secondary | ICD-10-CM

## 2021-08-01 LAB — CBC WITH DIFFERENTIAL/PLATELET
Abs Immature Granulocytes: 0.02 10*3/uL (ref 0.00–0.07)
Basophils Absolute: 0 10*3/uL (ref 0.0–0.1)
Basophils Relative: 0 %
Eosinophils Absolute: 0.1 10*3/uL (ref 0.0–0.5)
Eosinophils Relative: 1 %
HCT: 36.2 % — ABNORMAL LOW (ref 39.0–52.0)
Hemoglobin: 11.8 g/dL — ABNORMAL LOW (ref 13.0–17.0)
Immature Granulocytes: 0 %
Lymphocytes Relative: 10 %
Lymphs Abs: 0.6 10*3/uL — ABNORMAL LOW (ref 0.7–4.0)
MCH: 29.3 pg (ref 26.0–34.0)
MCHC: 32.6 g/dL (ref 30.0–36.0)
MCV: 89.8 fL (ref 80.0–100.0)
Monocytes Absolute: 0.6 10*3/uL (ref 0.1–1.0)
Monocytes Relative: 10 %
Neutro Abs: 5.2 10*3/uL (ref 1.7–7.7)
Neutrophils Relative %: 79 %
Platelets: 141 10*3/uL — ABNORMAL LOW (ref 150–400)
RBC: 4.03 MIL/uL — ABNORMAL LOW (ref 4.22–5.81)
RDW: 13.2 % (ref 11.5–15.5)
WBC: 6.6 10*3/uL (ref 4.0–10.5)
nRBC: 0 % (ref 0.0–0.2)

## 2021-08-01 LAB — BASIC METABOLIC PANEL
Anion gap: 7 (ref 5–15)
BUN: 46 mg/dL — ABNORMAL HIGH (ref 8–23)
CO2: 27 mmol/L (ref 22–32)
Calcium: 9.2 mg/dL (ref 8.9–10.3)
Chloride: 107 mmol/L (ref 98–111)
Creatinine, Ser: 1.67 mg/dL — ABNORMAL HIGH (ref 0.61–1.24)
GFR, Estimated: 42 mL/min — ABNORMAL LOW (ref >60)
Glucose, Bld: 113 mg/dL — ABNORMAL HIGH (ref 70–99)
Potassium: 3.9 mmol/L (ref 3.5–5.1)
Sodium: 141 mmol/L (ref 135–145)

## 2021-08-01 LAB — GLUCOSE, CAPILLARY
Glucose-Capillary: 99 mg/dL (ref 70–99)
Glucose-Capillary: 139 mg/dL — ABNORMAL HIGH (ref 70–99)
Glucose-Capillary: 106 mg/dL — ABNORMAL HIGH (ref 70–99)
Glucose-Capillary: 84 mg/dL (ref 70–99)
Glucose-Capillary: 140 mg/dL — ABNORMAL HIGH (ref 70–99)

## 2021-08-01 LAB — MAGNESIUM: Magnesium: 2.2 mg/dL (ref 1.7–2.4)

## 2021-08-01 LAB — PHOSPHORUS: Phosphorus: 2.5 mg/dL (ref 2.5–4.6)

## 2021-08-01 NOTE — Progress Notes (Signed)
PT Cancellation Note  Patient Details Name: Dillon Schultz MRN: [id number] DOB: [birth year]   Cancelled Treatment:    Reason Eval/Treat Not Completed: Other (comment). Evaluation attempted, pt sleeping upon arrival. Doesn't awaken to verbal cues but does arouse to tactile cues. Able to squeeze hands, however declines to open eyes or participate despite encouragement. Further attempts made for mobility efforts with patient squeezing eyes shut even further. Updated RN. Will re-attempt another time if patient is willing to participate.   Kimon Loewen 08/01/2021, 10:04 AM Greggory Stallion, PT, DPT, GCS [phone]

## 2021-08-01 NOTE — Progress Notes (Signed)
Progress Note    Dillon Schultz  RDE:[id number] DOB: [birth year]  DOA: 07/30/2021 PCP: [name], MD      Brief Narrative:    Medical records reviewed and are as summarized below:  Dillon Schultz is a 76 y.o. male with medical history significant for recent discharge from the hospital on 07/25/2021, dementia with behavioral disturbances, hypertension, dyslipidemia, chronic diastolic CHF, first-degree AV block.,  CKD stage IV.  He presented to the hospital because of witnessed syncopal episode.  Reportedly, since he was discharged home, he had refused to eat or drink.  He was found to have AKI and he also had recurrent hypoglycemic episodes in the hospital.  He was treated with IV fluids and IV dextrose.      Assessment/Plan:   Principal Problem:   Syncope and collapse Active Problems:   Dementia with behavioral disturbance   Acute kidney injury ([place])   AKI (acute kidney injury) ([facility])   1st degree AV block   Malnutrition of moderate degree     Body mass index is 21.35 kg/m.   AKI on CKD stage IIIa: Improved.  Monitor BMP.  S/p syncope: Probably from dehydration and AKI.  Recurrent hypoglycemia: Likely from poor oral intake.  Glucose levels are better today.  Continue to monitor  Dementia with behavioral disturbance: Continue Depakote and sertraline.  Continue supportive care  Generalized weakness: He will likely need rehab at discharge  Sinus bradycardia, first-degree AV block, hypertension: Heart rate and blood pressure are okay.  Of note, I spoke to his wife over the phone yesterday.  However, it was difficult to understand her.  I recommended that she come to the hospital if she could so we could have a face-to-face conversation.  It appears she has not shown up yet.     Diet Order             Diet regular Room service appropriate? Yes; Fluid consistency: Thin  Diet effective now                         Consultants: None  Procedures: None    Medications:    amLODipine  10 mg Oral Daily   aspirin  81 mg Oral Daily   citalopram  40 mg Oral Daily   divalproex  125 mg Oral Q12H   enoxaparin (LOVENOX) injection  40 mg Subcutaneous Q24H   feeding supplement  237 mL Oral TID BM   gabapentin  100 mg Oral TID   hydrALAZINE  25 mg Oral BID   mouth rinse  15 mL Mouth Rinse BID   multivitamin with minerals  1 tablet Oral Daily   sodium chloride flush  3 mL Intravenous Q12H   Continuous Infusions:     Anti-infectives (From admission, onward)    None              Family Communication/Anticipated D/C date and plan/Code Status   DVT prophylaxis: enoxaparin (LOVENOX) injection 40 mg Start: 07/30/21 2200     Code Status: Full Code  Family Communication: None Disposition Plan: Possible discharge home in 1 to 2 days   Status is: Inpatient Remains inpatient appropriate because: IV fluids, altered mental status                       Subjective:   Interval events noted.  He is unable to provide any history because of dementia.  He is more alert and  communicative today.  Objective:    Vitals:   07/31/21 2320 08/01/21 0543 08/01/21 0746 08/01/21 1104  BP: (!) 150/84 114/69 108/68 106/62  Pulse: (!) 57 67 60 61  Resp: 18 18    Temp: 98.2 F (36.8 C) 98.2 F (36.8 C)  97.6 F (36.4 C)  TempSrc: Oral Oral    SpO2: 99% 100% 99% 97%  Weight:      Height:       No data found.   Intake/Output Summary (Last 24 hours) at 08/01/2021 1553 Last data filed at 08/01/2021 0830 Gross per 24 hour  Intake 931.88 ml  Output 325 ml  Net 606.88 ml   Filed Weights   07/30/21 1426 07/30/21 1731 07/31/21 0217  Weight: 70 kg 63.1 kg 63.7 kg    Exam:  GEN: NAD SKIN: Chronic wound on the anterior aspect of the left leg EYES: No pallor or icterus ENT: MMM CV: RRR PULM: CTA B ABD: soft, ND, NT, +BS CNS: AAO x 1 (person), non focal EXT:  No edema or tenderness       Data Reviewed:   I have personally reviewed following labs and imaging studies:  Labs: Labs show the following:   Basic Metabolic Panel: Recent Labs  Lab 07/30/21 1431 07/31/21 0046 08/01/21 0539  NA 136 137 141  K 4.2 4.4 3.9  CL 102 104 107  CO2 26 26 27   GLUCOSE 93 106*   111* 113*  BUN 79* 70* 46*  CREATININE 2.77* 2.22* 1.67*  CALCIUM 9.6 9.1 9.2  MG  --   --  2.2  PHOS  --   --  2.5   GFR Estimated Creatinine Clearance: 33.4 mL/min (A) (by C-G formula based on SCr of 1.67 mg/dL (H)). Liver Function Tests: Recent Labs  Lab 07/30/21 1431  AST 23  ALT 14  ALKPHOS 81  BILITOT 0.9  PROT 7.7  ALBUMIN 3.9   No results for input(s): LIPASE, AMYLASE in the last 168 hours. No results for input(s): AMMONIA in the last 168 hours. Coagulation profile No results for input(s): INR, PROTIME in the last 168 hours.  CBC: Recent Labs  Lab 07/30/21 1431 07/31/21 0401 08/01/21 0539  WBC 5.5 4.9 6.6  NEUTROABS 4.3  --  5.2  HGB 13.3 11.1* 11.8*  HCT 41.2 33.9* 36.2*  MCV 91.6 90.2 89.8  PLT 149* 121* 141*   Cardiac Enzymes: No results for input(s): CKTOTAL, CKMB, CKMBINDEX, TROPONINI in the last 168 hours. BNP (last 3 results) No results for input(s): PROBNP in the last 8760 hours. CBG: Recent Labs  Lab 07/31/21 1245 07/31/21 1534 08/01/21 0529 08/01/21 0748 08/01/21 1106  GLUCAP 108* 128* 99 139* 106*   D-Dimer: No results for input(s): DDIMER in the last 72 hours. Hgb A1c: No results for input(s): HGBA1C in the last 72 hours. Lipid Profile: No results for input(s): CHOL, HDL, LDLCALC, TRIG, CHOLHDL, LDLDIRECT in the last 72 hours. Thyroid function studies: Recent Labs    07/30/21 1745  TSH 1.220   Anemia work up: No results for input(s): VITAMINB12, FOLATE, FERRITIN, TIBC, IRON, RETICCTPCT in the last 72 hours. Sepsis Labs: Recent Labs  Lab 07/30/21 1431 07/31/21 0401 08/01/21 0539  WBC 5.5 4.9 6.6     Microbiology Recent Results (from the past 240 hour(s))  Resp Panel by RT-PCR (Flu A&B, Covid) Nasopharyngeal Swab     Status: None   Collection Time: 07/30/21  2:32 PM   Specimen: Nasopharyngeal Swab; Nasopharyngeal(NP) swabs in vial transport  medium  Result Value Ref Range Status   SARS Coronavirus 2 by RT PCR NEGATIVE NEGATIVE Final    Comment: (NOTE) SARS-CoV-2 target nucleic acids are NOT DETECTED.  The SARS-CoV-2 RNA is generally detectable in upper respiratory specimens during the acute phase of infection. The lowest concentration of SARS-CoV-2 viral copies this assay can detect is 138 copies/mL. A negative result does not preclude SARS-Cov-2 infection and should not be used as the sole basis for treatment or other patient management decisions. A negative result may occur with  improper specimen collection/handling, submission of specimen other than nasopharyngeal swab, presence of viral mutation(s) within the areas targeted by this assay, and inadequate number of viral copies(<138 copies/mL). A negative result must be combined with clinical observations, patient history, and epidemiological information. The expected result is Negative.  Fact Sheet for Patients:  EntrepreneurPulse.com.au  Fact Sheet for Healthcare Providers:  IncredibleEmployment.be  This test is no t yet approved or cleared by the [place] FDA and  has been authorized for detection and/or diagnosis of SARS-CoV-2 by FDA under an Emergency Use Authorization (EUA). This EUA will remain  in effect (meaning this test can be used) for the duration of the COVID-19 declaration under Section 564(b)(1) of the Act, 21 U.S.C.section 360bbb-3(b)(1), unless the authorization is terminated  or revoked sooner.       Influenza A by PCR NEGATIVE NEGATIVE Final   Influenza B by PCR NEGATIVE NEGATIVE Final    Comment: (NOTE) The Xpert Xpress SARS-CoV-2/FLU/RSV plus assay is  intended as an aid in the diagnosis of influenza from Nasopharyngeal swab specimens and should not be used as a sole basis for treatment. Nasal washings and aspirates are unacceptable for Xpert Xpress SARS-CoV-2/FLU/RSV testing.  Fact Sheet for Patients: EntrepreneurPulse.com.au  Fact Sheet for Healthcare Providers: IncredibleEmployment.be  This test is not yet approved or cleared by the [place] FDA and has been authorized for detection and/or diagnosis of SARS-CoV-2 by FDA under an Emergency Use Authorization (EUA). This EUA will remain in effect (meaning this test can be used) for the duration of the COVID-19 declaration under Section 564(b)(1) of the Act, 21 U.S.C. section 360bbb-3(b)(1), unless the authorization is terminated or revoked.  Performed at [facility], [address] [place], [place] [address]     Procedures and diagnostic studies:  MR BRAIN WO CONTRAST  Result Date: 07/30/2021 CLINICAL DATA:  Dizziness EXAM: MRI HEAD WITHOUT CONTRAST TECHNIQUE: Multiplanar, multiecho pulse sequences of the brain and surrounding structures were obtained without intravenous contrast. COMPARISON:  No prior MRI head, correlation is made with CT head 07/30/2021 FINDINGS: Brain: No restricted diffusion to suggest acute or subacute infarct. No acute hemorrhage, mass, mass effect, or midline shift. No hydrocephalus or extra-axial collection. Ventriculomegaly, likely secondary to advanced cerebral atrophy for age. Confluent T2 hyperintense signal in the periventricular white matter, likely the sequela of severe chronic small vessel ischemic disease. Vascular: Normal flow voids. Skull and upper cervical spine: Normal marrow signal. Sinuses/Orbits: Negative. Other: None. IMPRESSION: No acute intracranial process. Cerebral atrophy, which appears advanced for age. Electronically Signed   By: Merilyn Baba M.D.   On: 07/30/2021 21:47                LOS: 1 day   Dillon Schultz  [facility]   Pager on [email or web address]. If 7PM-7AM, please contact night-coverage at [email or web address]     08/01/2021, 3:53 PM

## 2021-08-02 DIAGNOSIS — R55 Syncope and collapse: Secondary | ICD-10-CM | POA: Diagnosis not present

## 2021-08-02 DIAGNOSIS — E162 Hypoglycemia, unspecified: Secondary | ICD-10-CM

## 2021-08-02 DIAGNOSIS — R531 Weakness: Secondary | ICD-10-CM

## 2021-08-02 LAB — BASIC METABOLIC PANEL
Anion gap: 4 — ABNORMAL LOW (ref 5–15)
BUN: 33 mg/dL — ABNORMAL HIGH (ref 8–23)
CO2: 30 mmol/L (ref 22–32)
Calcium: 8.9 mg/dL (ref 8.9–10.3)
Chloride: 109 mmol/L (ref 98–111)
Creatinine, Ser: 1.46 mg/dL — ABNORMAL HIGH (ref 0.61–1.24)
GFR, Estimated: 49 mL/min — ABNORMAL LOW (ref >60)
Glucose, Bld: 91 mg/dL (ref 70–99)
Potassium: 4 mmol/L (ref 3.5–5.1)
Sodium: 143 mmol/L (ref 135–145)

## 2021-08-02 LAB — GLUCOSE, CAPILLARY
Glucose-Capillary: 92 mg/dL (ref 70–99)
Glucose-Capillary: 92 mg/dL (ref 70–99)
Glucose-Capillary: 81 mg/dL (ref 70–99)
Glucose-Capillary: 99 mg/dL (ref 70–99)
Glucose-Capillary: 128 mg/dL — ABNORMAL HIGH (ref 70–99)
Glucose-Capillary: 121 mg/dL — ABNORMAL HIGH (ref 70–99)
Glucose-Capillary: 84 mg/dL (ref 70–99)

## 2021-08-02 MED ORDER — AMLODIPINE BESYLATE 5 MG PO TABS: 5.0000 mg | ORAL_TABLET | Freq: Every day | ORAL | Status: DC

## 2021-08-02 NOTE — Assessment & Plan Note (Addendum)
Recurrent.  Likely due to poor PO intake.   Improved. CBG's past 24 hours 91>> 171>> 102>> 89>> 78>> 96>> 101>> 108 Encourage PO intake. Hypoglycemia protocol.

## 2021-08-02 NOTE — Evaluation (Addendum)
Physical Therapy Evaluation Patient Details Name: Dillon Schultz MRN: [id number] DOB: 06-21-1946 Today's Date: 08/02/2021  History of Present Illness  Dillon Schultz is a 76 y.o. male with medical history significant for recent discharge from the hospital on 07/25/2021, dementia with behavioral disturbances, hypertension, dyslipidemia, chronic diastolic CHF, first-degree AV block.,  CKD stage IV.  He presented to the hospital because of witnessed syncopal episode.  Reportedly, since he was discharged home, he had refused to eat or drink.  Clinical Impression  CO-treat performed with OT. Pt is a pleasant 76 year old male who was admitted for syncopal episode. Pt performs bed mobility, transfers, and ambulation with min assist +2 for safety. Poor orientation and takes max encouragement for participation. Upon arrival, pt with covers over head. Pt demonstrates deficits with strength/pain/mobility. Complains of L LE pain and demonstrates weakness. Needs RW for all mobility, and +2 for safety. Currently not at baseline level. BP drops with standing and reports dizziness. Due to cognition status, will place on 3 day trial to monitor progress with therapy and retention. Anticipate pt may be better suited in a LTC as he seems hard to manage at home with family per review of chart. Would benefit from skilled PT to address above deficits and promote optimal return to PLOF; recommend transition to STR upon discharge from acute hospitalization.     Recommendations for follow up therapy are one component of a multi-disciplinary discharge planning process, led by the attending physician.  Recommendations may be updated based on patient status, additional functional criteria and insurance authorization.  Follow Up Recommendations Skilled nursing-short term rehab (<3 hours/day)    Assistance Recommended at Discharge Frequent or constant Supervision/Assistance  Patient can return home with the following  Two people to  help with walking and/or transfers;Two people to help with bathing/dressing/bathroom;Direct supervision/assist for medications management;Assistance with feeding;Assist for transportation;Help with stairs or ramp for entrance;Assistance with cooking/housework;Direct supervision/assist for financial management    Equipment Recommendations None recommended by PT  Recommendations for Other Services       Functional Status Assessment Patient has had a recent decline in their functional status and/or demonstrates limited ability to make significant improvements in function in a reasonable and predictable amount of time     Precautions / Restrictions Precautions Precautions: Fall Restrictions Weight Bearing Restrictions: No      Mobility  Bed Mobility Overal bed mobility: Needs Assistance Bed Mobility: Supine to Sit, Sit to Supine     Supine to sit: Min assist Sit to supine: Min assist   General bed mobility comments: needs heavy cues for sequencing and participation. Once seated at EOB, able to maintain upright posture.    Transfers Overall transfer level: Needs assistance Equipment used: Rolling walker (2 wheels) Transfers: Sit to/from Stand Sit to Stand: Min assist, From elevated surface           General transfer comment: needs assist for initiation and once standing, able to hold onto RW. CGA to maintain static balance.    Ambulation/Gait Ambulation/Gait assistance: Min assist, +2 physical assistance, +2 safety/equipment Gait Distance (Feet): 3 Feet Assistive device: Rolling walker (2 wheels) Gait Pattern/deviations: Step-to pattern       General Gait Details: able to perform side steps up towards EOB. Cues and physical assist required for sequencing of RW. Reports increased pain with WB on L LE.  Stairs            Wheelchair Mobility    Modified Rankin (Stroke Patients Only)  Balance Overall balance assessment: Needs assistance Sitting-balance  support: No upper extremity supported, Feet unsupported Sitting balance-Leahy Scale: Good     Standing balance support: Bilateral upper extremity supported Standing balance-Leahy Scale: Fair                               Pertinent Vitals/Pain Pain Assessment Pain Assessment: Faces Faces Pain Scale: Hurts little more Pain Location: L foot Pain Descriptors / Indicators: Discomfort, Guarding, Grimacing Pain Intervention(s): Limited activity within patient's tolerance, Repositioned    Home Living Family/patient expects to be discharged to:: Private residence Living Arrangements: Spouse/significant other;Other relatives Available Help at Discharge: Family   Home Access: Stairs to enter   Entrance Stairs-Number of Steps: 2   Home Layout: One level   Additional Comments: pt is poor historian, all home living obtained from previous chart    Prior Function Prior Level of Function : Patient poor historian/Family not available             Mobility Comments: no family present to report       Hand Dominance        Extremity/Trunk Assessment   Upper Extremity Assessment Upper Extremity Assessment: Generalized weakness (grip strength 5/5; limited shoulder ROM unable to lift above 90 degrees)    Lower Extremity Assessment Lower Extremity Assessment: Generalized weakness (B LE grossly 3+/5; L LE slightly weaker due to pain)       Communication   Communication: No difficulties  Cognition Arousal/Alertness: Awake/alert Behavior During Therapy: Flat affect Overall Cognitive Status: No family/caregiver present to determine baseline cognitive functioning                                 General Comments: very tangential, confused- only alert to self. Thinks it is 15 and that he is in [place] comments (skin integrity, edema, etc.): sitting BP: 100/65, MAP 78, HR 84. Standing: BP 86/60, MAP 69, HR 103     Exercises Other Exercises Other Exercises: seated ther-ex performed on B LE including LAQ, alt marching, and AP. Has difficulty following commands and needs encouragement for participation. 10 reps with safe technique. Has difficulty staying on topic. Other Exercises: Another attempt for standing performed for BP with pt reporting dizziness with standing. Other Exercises: while standing, able to perform static quiet standing while 2nd person changed bed linen. Slight increased in sway, however no formal LOB noted   Assessment/Plan    PT Assessment  (will see pt for trial PT)  PT Problem List Decreased strength;Decreased activity tolerance;Decreased balance;Decreased mobility;Pain       PT Treatment Interventions DME instruction;Therapeutic exercise;Gait training;Balance training;Neuromuscular re-education;Stair training;Functional mobility training;Therapeutic activities;Patient/family education;Manual techniques    PT Goals (Current goals can be found in the Care Plan section)  Acute Rehab PT Goals Patient Stated Goal: unable to state PT Goal Formulation: Patient unable to participate in goal setting Time For Goal Achievement: 08/16/21 Potential to Achieve Goals: Fair    Frequency  (3 day trial)     Co-evaluation PT/OT/SLP Co-Evaluation/Treatment: Yes Reason for Co-Treatment: To address functional/ADL transfers;For patient/therapist safety;Necessary to address cognition/behavior during functional activity PT goals addressed during session: Mobility/safety with mobility OT goals addressed during session: ADL's and self-care       AM-PAC PT "6 Clicks" Mobility  Outcome Measure Help needed turning from your back  to your side while in a flat bed without using bedrails?: A Little Help needed moving from lying on your back to sitting on the side of a flat bed without using bedrails?: A Little Help needed moving to and from a bed to a chair (including a wheelchair)?: A Lot Help  needed standing up from a chair using your arms (e.g., wheelchair or bedside chair)?: A Lot Help needed to walk in hospital room?: A Lot Help needed climbing 3-5 steps with a railing? : A Lot 6 Click Score: 14    End of Session   Activity Tolerance: Patient tolerated treatment well Patient left: in bed;with bed alarm set Nurse Communication: Mobility status PT Visit Diagnosis: Difficulty in walking, not elsewhere classified (R26.2);Muscle weakness (generalized) (M62.81);Pain Pain - Right/Left: Left Pain - part of body: Leg    Time: 0940-1005 PT Time Calculation (min) (ACUTE ONLY): 25 min   Charges:   PT Evaluation $PT Eval Low Complexity: 1 Low PT Treatments $Therapeutic Exercise: 8-22 mins        Greggory Stallion, PT, DPT, GCS [phone]   Trenese Haft 08/02/2021, 2:36 PM

## 2021-08-02 NOTE — Assessment & Plan Note (Addendum)
Superimposed on CKD stage IIIb.   AKI likely due to dehydration.  Renal function improved with IV fluids. Monitor BMP.  Encourage oral hydration.

## 2021-08-02 NOTE — Hospital Course (Addendum)
Dillon Schultz is a 76 y.o. male with medical history significant for recent discharge from the hospital on 07/25/2021, dementia with behavioral disturbances, hypertension, dyslipidemia, chronic diastolic CHF, first-degree AV block.,  CKD stage IV.  He presented to the hospital because of witnessed syncopal episode.  Reportedly, since he was discharged home, he had refused to eat or drink.   He was found to have AKI and he also had recurrent hypoglycemic episodes in the hospital.  He was treated with IV fluids and IV dextrose.  Orthostatic vitals were positive and patient reported significant dizziness when upright.  Following attempts at repeat orthostatic vitals, pt would refuse to sit up or stand, therefore it is unclear if dizziness and orthostatic hypotension were improved.

## 2021-08-02 NOTE — Assessment & Plan Note (Addendum)
PT/OT recommend SNF. TOC following. Fall precautions.  2/9: family want to bring pt home with [facility] services, declining SNF 2/10: TOC unable to secure any [facility] agency; family made aware

## 2021-08-02 NOTE — Assessment & Plan Note (Addendum)
Likely due to orthostatic hypotension and dehydration with AKI.   2/9: Positive orthostatic vitals again today De-escalating antihypertensive therapy slowly.  Stop amlodipine and reduce hydralazine to twice daily --Fall precautions --Daily orthostatic vitals --Telemetry

## 2021-08-02 NOTE — Assessment & Plan Note (Addendum)
With soft BP's and orthostatic hypotension noted by OT).  Home regimen included amlodipine 10 mg, hydralazine 25 mg TID.   --Stop amlodipine -- Reduce hydralazine to twice daily --Added midodrine to help orthostatic sx's

## 2021-08-02 NOTE — Assessment & Plan Note (Addendum)
Continue Depakote and Celexa.   Supportive Care.  Delirium Precautions. 2/9: Depakote level low at 26 --Increase Depakote dose to 250 mg BID --Monitor levels

## 2021-08-02 NOTE — Progress Notes (Signed)
Occupational Therapy Treatment Patient Details Name: Dillon Schultz MRN: [id number] DOB: May 01, 1946 Today's Date: 08/02/2021   History of present illness Dillon Schultz is a 76 y.o. male with medical history significant for recent discharge from the hospital on 07/25/2021, dementia with behavioral disturbances, hypertension, dyslipidemia, chronic diastolic CHF, first-degree AV block.,  CKD stage IV.  He presented to the hospital because of witnessed syncopal episode.  Reportedly, since he was discharged home, he had refused to eat or drink.   OT comments  Mr Guia was seen for OT/PT co-treatment on this date. Upon arrival to room pt reclined in bed with sheets over head, oriented to self only, cues to participate. Pt requires MIN A x2 + RW sit<>stand and steps along EOB. MIN A standing reaching inside BOS. MIN A don/doff gown sitting EOB, wet linens changed and pt left in dry bed with all needs in reach. Pt making good progress toward goals. Pt continues to benefit from skilled OT services to maximize return to PLOF and minimize risk of future falls, injury, caregiver burden, and readmission. Will continue to follow POC. Discharge recommendation remains appropriate.   SITTING: BP 100/65, MAP 78, HR 84 STANDING: BP 86/60, MAP 69, HR 103    Recommendations for follow up therapy are one component of a multi-disciplinary discharge planning process, led by the attending physician.  Recommendations may be updated based on patient status, additional functional criteria and insurance authorization.    Follow Up Recommendations  Skilled nursing-short term rehab (<3 hours/day)    Assistance Recommended at Discharge Frequent or constant Supervision/Assistance  Patient can return home with the following  Two people to help with bathing/dressing/bathroom;Assistance with cooking/housework;Assistance with feeding;Help with stairs or ramp for entrance;Assist for transportation;Direct supervision/assist for  financial management;Direct supervision/assist for medications management;Two people to help with walking and/or transfers   Equipment Recommendations  BSC/3in1    Recommendations for Other Services      Precautions / Restrictions Precautions Precautions: Fall Restrictions Weight Bearing Restrictions: No       Mobility Bed Mobility Overal bed mobility: Needs Assistance Bed Mobility: Supine to Sit, Sit to Supine     Supine to sit: Min assist Sit to supine: Min assist        Transfers Overall transfer level: Needs assistance Equipment used: Rolling walker (2 wheels) Transfers: Sit to/from Stand Sit to Stand: Min assist, From elevated surface           General transfer comment: +2 for side steps     Balance Overall balance assessment: Needs assistance Sitting-balance support: No upper extremity supported, Feet unsupported Sitting balance-Leahy Scale: Fair     Standing balance support: Single extremity supported, During functional activity Standing balance-Leahy Scale: Fair                             ADL either performed or assessed with clinical judgement   ADL Overall ADL's : Needs assistance/impaired                                       General ADL Comments: MIN A x2 + RW for ADL t/f. MIN A standing reaching task inside BOS. MIN A don/doff gown sitting EOB.      Cognition Arousal/Alertness: Awake/alert Behavior During Therapy: Flat affect Overall Cognitive Status: No family/caregiver present to determine baseline cognitive functioning  General Comments: more talkative this date, tangential, oriented to self only              General Comments SITTING: BP 100/65, MAP 78, HR 84. STANDING: BP 86/60, MAP 69, HR 103    Pertinent Vitals/ Pain       Pain Assessment Pain Assessment: Faces Faces Pain Scale: Hurts little more Pain Location: L foot Pain Descriptors / Indicators:  Discomfort, Guarding, Grimacing Pain Intervention(s): Limited activity within patient's tolerance, Repositioned   Frequency  Min 2X/week        Progress Toward Goals  OT Goals(current goals can now be found in the care plan section)  Progress towards OT goals: Progressing toward goals  Acute Rehab OT Goals Patient Stated Goal: to walk OT Goal Formulation: With patient Time For Goal Achievement: 08/14/21 Potential to Achieve Goals: Fair ADL Goals Pt Will Perform Grooming: with supervision;sitting;with set-up Pt Will Perform Lower Body Dressing: with min assist;sitting/lateral leans Pt Will Transfer to Toilet: with mod assist;squat pivot transfer;bedside commode  Plan Discharge plan remains appropriate;Frequency remains appropriate    Co-evaluation    PT/OT/SLP Co-Evaluation/Treatment: Yes Reason for Co-Treatment: To address functional/ADL transfers;Necessary to address cognition/behavior during functional activity PT goals addressed during session: Mobility/safety with mobility OT goals addressed during session: ADL's and self-care      AM-PAC OT "6 Clicks" Daily Activity     Outcome Measure   Help from another person eating meals?: A Little Help from another person taking care of personal grooming?: A Lot Help from another person toileting, which includes using toliet, bedpan, or urinal?: A Lot Help from another person bathing (including washing, rinsing, drying)?: A Lot Help from another person to put on and taking off regular upper body clothing?: A Little Help from another person to put on and taking off regular lower body clothing?: A Lot 6 Click Score: 14    End of Session Equipment Utilized During Treatment: Rolling walker (2 wheels)  OT Visit Diagnosis: Other abnormalities of gait and mobility (R26.89);Other symptoms and signs involving cognitive function;Pain Pain - Right/Left: Left Pain - part of body: Ankle and joints of foot   Activity Tolerance  Patient tolerated treatment well   Patient Left in bed;with call bell/phone within reach;with bed alarm set   Nurse Communication Mobility status        Time: 2924-4628 OT Time Calculation (min): 26 min  Charges: OT General Charges $OT Visit: 1 Visit OT Treatments $Self Care/Home Management : 8-22 mins  Dessie Coma, M.S. OTR/L  08/02/21, 10:47 AM  ascom [phone]

## 2021-08-02 NOTE — Assessment & Plan Note (Addendum)
Due to chronic illness (dementia) as evidenced by mild fat depletion, moderate fat depletion, moderate muscle depletion, severe muscle depletion, percent weight loss. --Dietitian following -Continue Ensure Enlive poTID -Continue MVI with minerals daily -Continue regular diet -30 ml Prosource Plus BID

## 2021-08-02 NOTE — Assessment & Plan Note (Addendum)
With sinus bradycardia.   Stable HR's and BP. Monitor.

## 2021-08-02 NOTE — Progress Notes (Addendum)
Progress Note   Patient: Dillon Schultz JHE:[id number] DOB: 17-Jul-1945 DOA: 07/30/2021     2 DOS: the patient was seen and examined on 08/02/2021   Brief hospital course: Haneef Hallquist is a 76 y.o. male with medical history significant for recent discharge from the hospital on 07/25/2021, dementia with behavioral disturbances, hypertension, dyslipidemia, chronic diastolic CHF, first-degree AV block.,  CKD stage IV.  He presented to the hospital because of witnessed syncopal episode.  Reportedly, since he was discharged home, he had refused to eat or drink.   He was found to have AKI and he also had recurrent hypoglycemic episodes in the hospital.  He was treated with IV fluids and IV dextrose.  Assessment and Plan: * Syncope and collapse- (present on admission) Likely due to orthostatic hypotension and dehydration with AKI.  Has underlying bradycardia as well.    HR's running 50's to 70's past 24 hours. Positive orthostatic vitals with OT today. De-escalating antihypertensive therapy slowly. 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  AKI likely due to dehydration.  Renal function improved with IV fluids. Monitor BMP. Encourage oral hydration.  Generalized weakness PT/OT recommend SNF. TOC following for placement. Fall precautions.  Hypoglycemia Recurrent.  Likely due to poor PO intake.   Improved. CBG's past 24 hours 92 >> 81 >> 99>> 128.  Encourage PO intake. Hypoglycemia protocol.  Malnutrition of moderate degree Due to chronic illness (dementia) as evidenced by mild fat depletion, moderate fat depletion, moderate muscle depletion, severe muscle depletion, percent weight loss. --Dietitian following ---Ensure Enlive po TID, each supplement provides 350 kcal and 20 grams of protein.  -MVI with minerals daily -Liberalize diet to regular for increased variety of food selections      1st degree AV block- (present on admission) With sinus bradycardia.  Stable HR's and BP. Monitor.  Hypertension- (present on admission) With soft BP's and orthostatic hypotension noted by OT).  Currently on amlodipine 10 mg, hydralazine 25 mg TID.  Reduce amlodipine to 5 mg.  Further reduce as needed.  Suspect soft BP's related to poor PO intake.  Dementia with behavioral disturbance- (present on admission) Continue Depakote and Zoloft.   Supportive Care.  Delirium Precautions.        Subjective: Pt awake resting in bed when seen.  He says he needs a barrel of money.  Pleasantly confused.  Says this a few times but offers no other complaints.  No family at bedside at the time.   Physical Exam: Vitals:   08/02/21 0338 08/02/21 0801 08/02/21 1053 08/02/21 1510  BP: 126/80 138/88 116/88 137/73  Pulse: 62 71 65 (!) 52  Resp: 20 17 17 18   Temp: 98 F (36.7 C) 98 F (36.7 C) 98.7 F (37.1 C) 98 F (36.7 C)  TempSrc: Oral     SpO2: 99% 100%  100%  Weight:      Height:       General exam: awake, alert, no acute distress HEENT: moist mucus membranes, hearing grossly normal  Respiratory system: CTAB, no wheezes, rales or rhonchi, normal respiratory effort. Cardiovascular system: normal S1/S2, RRR, no pedal edema.   Gastrointestinal system: soft, non-tender abdomen, +bowel sounds Central nervous system: no gross focal neurologic deficits, normal speech Extremities: moves all , no edema, normal tone Skin: dry, intact, normal temperature Psychiatry: normal mood, congruent affect, abnormal judgement and insight due to dementia   Data Reviewed:  Labs reviewed notable for Cr  1.46, BUN 33, glucose 81>>99>>128  Family Communication: none at bedside on rounds, will attempt to call wife  Disposition: Status is: Inpatient Remains inpatient appropriate because: requires SNF placement, remains with inadequate PO intake and orthostatic hypotension.          Planned  Discharge Destination: Skilled nursing facility     Time spent: 35 minutes  Author: Ezekiel Slocumb, DO 08/02/2021 5:38 PM  For on call review [email or web address].

## 2021-08-03 DIAGNOSIS — I951 Orthostatic hypotension: Secondary | ICD-10-CM | POA: Clinically undetermined

## 2021-08-03 DIAGNOSIS — R55 Syncope and collapse: Secondary | ICD-10-CM | POA: Diagnosis not present

## 2021-08-03 LAB — BASIC METABOLIC PANEL
Anion gap: 7 (ref 5–15)
BUN: 23 mg/dL (ref 8–23)
CO2: 30 mmol/L (ref 22–32)
Calcium: 8.9 mg/dL (ref 8.9–10.3)
Chloride: 102 mmol/L (ref 98–111)
Creatinine, Ser: 1.34 mg/dL — ABNORMAL HIGH (ref 0.61–1.24)
GFR, Estimated: 55 mL/min — ABNORMAL LOW (ref >60)
Glucose, Bld: 94 mg/dL (ref 70–99)
Potassium: 3.9 mmol/L (ref 3.5–5.1)
Sodium: 139 mmol/L (ref 135–145)

## 2021-08-03 LAB — GLUCOSE, CAPILLARY
Glucose-Capillary: 102 mg/dL — ABNORMAL HIGH (ref 70–99)
Glucose-Capillary: 162 mg/dL — ABNORMAL HIGH (ref 70–99)
Glucose-Capillary: 171 mg/dL — ABNORMAL HIGH (ref 70–99)
Glucose-Capillary: 78 mg/dL (ref 70–99)
Glucose-Capillary: 89 mg/dL (ref 70–99)
Glucose-Capillary: 91 mg/dL (ref 70–99)

## 2021-08-03 LAB — VALPROIC ACID LEVEL: Valproic Acid Lvl: 26 ug/mL — ABNORMAL LOW (ref 50.0–100.0)

## 2021-08-03 MED ORDER — BISACODYL 5 MG PO TBEC: 5.0000 mg | DELAYED_RELEASE_TABLET | Freq: Every day | ORAL | Status: DC | PRN

## 2021-08-03 MED ORDER — DIVALPROEX SODIUM 250 MG PO DR TAB
250.0000 mg | DELAYED_RELEASE_TABLET | Freq: Two times a day (BID) | ORAL | Status: DC
  Administered 2021-08-03 – 2021-08-05 (×4): 250 mg via ORAL
  Filled 2021-08-03 (×8): qty 1

## 2021-08-03 MED ORDER — SENNOSIDES-DOCUSATE SODIUM 8.6-50 MG PO TABS
1.0000 | ORAL_TABLET | Freq: Two times a day (BID) | ORAL | Status: DC
  Administered 2021-08-03 – 2021-08-04 (×2): 1 via ORAL
  Filled 2021-08-03 (×3): qty 1

## 2021-08-03 MED ORDER — MIDODRINE HCL 5 MG PO TABS
5.0000 mg | ORAL_TABLET | Freq: Three times a day (TID) | ORAL | Status: DC
  Administered 2021-08-03 – 2021-08-05 (×4): 5 mg via ORAL
  Filled 2021-08-03 (×6): qty 1

## 2021-08-03 MED ORDER — PROSOURCE PLUS PO LIQD
30.0000 mL | Freq: Two times a day (BID) | ORAL | Status: DC
  Administered 2021-08-04 – 2021-08-05 (×2): 30 mL via ORAL
  Filled 2021-08-03: qty 30

## 2021-08-03 MED ORDER — POLYETHYLENE GLYCOL 3350 17 G PO PACK
17.0000 g | PACK | Freq: Every day | ORAL | Status: DC
  Administered 2021-08-03: 17 g via ORAL
  Filled 2021-08-03: qty 1

## 2021-08-03 NOTE — Progress Notes (Signed)
Nutrition Follow-up  DOCUMENTATION CODES:   Non-severe (moderate) malnutrition in context of chronic illness  INTERVENTION:   -Continue Ensure Enlive po TID, each supplement provides 350 kcal and 20 grams of protein.  -Continue MVI with minerals daily -Continue regular diet  -30 ml Prosource Plus BID, each supplement provides 100 kcals and 15 grams protein -No documented BM since admission; messaged RN and MD about possibility of starting bowel regimen  NUTRITION DIAGNOSIS:   Moderate Malnutrition related to chronic illness (dementia) as evidenced by mild fat depletion, moderate fat depletion, moderate muscle depletion, severe muscle depletion, percent weight loss.  Ongoing  GOAL:   Patient will meet greater than or equal to 90% of their needs  Progressing   MONITOR:   PO intake, Supplement acceptance, Labs, Weight trends, Skin, I & O's  REASON FOR ASSESSMENT:   Malnutrition Screening Tool    ASSESSMENT:   Dillon Schultz is a 76 y.o. male with medical history significant for recent discharge from the hospital on 07/25/2021, dementia with behavioral disturbances, hypertension, dyslipidemia, chronic diastolic CHF, first-degree AV block.,  CKD stage IV.  He presented to the hospital because of witnessed syncopal episode.  Reportedly, since he was discharged home, he had refused to eat or drink.  Reviewed I/O's: +540 ml x 24 hours and +2.4 L since admission  UOP: 300 ml x 24 hours  Pt sleeping soundly at time of visit. Observed that pt consumed about 1/3 of Ensure supplement. Pt has refused some Ensure yesterday.   Pt's intake has been improving. Noted meal completions 5-75%.    Noted pt has not had a BM since admission. Case discussed with MD and RN via secure chat about possibility of initiating bowel regimen.   Per TOC notes, plan to discharge home with home health once medically stable.   Labs reviewed: CBGS: 81-162 (inpatient orders for glycemic control are none).     Diet Order:   Diet Order             Diet regular Room service appropriate? Yes; Fluid consistency: Thin  Diet effective now                   EDUCATION NEEDS:   Education needs have been addressed  Skin:  Skin Assessment: Skin Integrity Issues: Skin Integrity Issues:: Other (Comment) Other: opne scab to lt pretibial  Last BM:  Unknown  Height:   Ht Readings from Last 1 Encounters:  07/30/21 5\' 8"  (1.727 m)    Weight:   Wt Readings from Last 1 Encounters:  08/03/21 63.5 kg    Ideal Body Weight:  70 kg  BMI:  Body mass index is 21.29 kg/m.  Estimated Nutritional Needs:   Kcal:  1900-2100  Protein:  95-110 grams  Fluid:  > 1.9 L    Loistine Chance, RD, LDN, [place] Registered Dietitian II Certified Diabetes Care and Education Specialist Please refer to [facility] for RD and/or RD on-call/weekend/after hours pager

## 2021-08-03 NOTE — Progress Notes (Signed)
Progress Note   Patient: Dillon Schultz LKT:[id number] DOB: 01-28-1946 DOA: 07/30/2021     3 DOS: the patient was seen and examined on 08/03/2021   Brief hospital course: Psalm Schappell is a 76 y.o. male with medical history significant for recent discharge from the hospital on 07/25/2021, dementia with behavioral disturbances, hypertension, dyslipidemia, chronic diastolic CHF, first-degree AV block.,  CKD stage IV.  He presented to the hospital because of witnessed syncopal episode.  Reportedly, since he was discharged home, he had refused to eat or drink.   He was found to have AKI and he also had recurrent hypoglycemic episodes in the hospital.  He was treated with IV fluids and IV dextrose.  2/9: pt again has positive orthostatic vitals and symptomatic with dizziness   Assessment and Plan: * Syncope and collapse- (present on admission) Likely due to orthostatic hypotension and dehydration with AKI.   2/9: Positive orthostatic vitals again today De-escalating antihypertensive therapy slowly.  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   AKI likely due to dehydration.  Renal function improved with IV fluids. Monitor BMP.  Encourage oral hydration.  Orthostatic hypotension Patient has dizziness when upright and orthostatic vitals positive yesterday and today.  Scaling back antihypertensive regimen.  Consider adding midodrine if not improved with liberalized blood pressure control.  Generalized weakness PT/OT recommend SNF. TOC following. Fall precautions.  2/9: family want to bring pt home with [facility] services, declining SNF  Hypoglycemia Recurrent.  Likely due to poor PO intake.   Improved. CBG's past 24 hours 128 >> 121 >> 84 >> 162 >> 91 Encourage PO intake. Hypoglycemia protocol.  Malnutrition of moderate degree Due to chronic  illness (dementia) as evidenced by mild fat depletion, moderate fat depletion, moderate muscle depletion, severe muscle depletion, percent weight loss. --Dietitian following -Continue Ensure Enlive po TID -Continue MVI with minerals daily -Continue regular diet  -30 ml Prosource Plus BID    1st degree AV block- (present on admission) With sinus bradycardia.   Stable HR's and BP. Monitor.  Hypertension- (present on admission) With soft BP's and orthostatic hypotension noted by OT).  Home regimen included amlodipine 10 mg, hydralazine 25 mg TID.  --Stop amlodipine -- Reduce hydralazine to twice daily --Consider adding midodrine if orthostatics not improved  Dementia with behavioral disturbance- (present on admission) Continue Depakote and Celexa.   Supportive Care.  Delirium Precautions. 2/9: Depakote level low at 26 --Increase Depakote dose to 250 mg BID --Monitor levels        Subjective: Patient awake is sitting in recliner when seen today.  He reports feeling well.  Denied dizziness on standing but when discussed with bedside RN, orthostatics were positive again today and patient did complain of dizziness at that time.  Patient denies other acute complaints.  Physical Exam: Vitals:   08/03/21 1018 08/03/21 1158 08/03/21 1159 08/03/21 1200  BP: (!) 146/89 132/84 106/71 90/66  Pulse: 88 71 87 89  Resp:   18 20  Temp:  98.7 F (37.1 C) 98.7 F (37.1 C) 98.7 F (37.1 C)  TempSrc:  Oral Oral Oral  SpO2:  98% 100% 99%  Weight:      Height:       General exam: awake, alert, no acute distress HEENT: atraumatic, clear conjunctiva, anicteric sclera, moist mucus membranes, hearing grossly normal  Respiratory system: CTAB, no wheezes, rales or rhonchi, normal  respiratory effort. Cardiovascular system: normal S1/S2, RRR, no pedal edema.   Gastrointestinal system: soft, NT, ND, no HSM felt, +bowel sounds. Central nervous system: no gross focal neurologic deficits, normal  speech Extremities: moves all, no edema, normal tone Psychiatry: normal mood, congruent affect, abnormal judgment and insight due to dementia   Data Reviewed:  Labs reviewed and notable for creatinine improved from 1.46-1.34, CBGs this morning 91, 171 with no hypoglycemic events.  Depakote level low at 26  Family Communication: None at bedside.  Will attempt to call this afternoon.    Disposition: Status is: Inpatient Remains inpatient appropriate because: Persistent orthostatic hypotension requiring further monitoring and adjusting of medications      Planned Discharge Destination: Home with Home Health     Time spent: 35 minutes  Author: Ezekiel Slocumb, DO 08/03/2021 12:44 PM  For on call review [email or web address].

## 2021-08-03 NOTE — Care Management Important Message (Signed)
Important Message  Patient Details  Name: Dillon Schultz MRN: [id number] Date of Birth: 03-02-1946   Medicare Important Message Given:  Yes  Patient asleep at time of visit with no family in room.  Copy of Medicare IM left on counter for reference.    Dannette Barbara 08/03/2021, 1:01 PM

## 2021-08-03 NOTE — TOC Initial Note (Addendum)
Transition of Care [facility]) - Initial/Assessment Note    Patient Details  Name: Dillon Schultz MRN: [id number] Date of Birth: 03/02/1946  [facility] CM/SW Contact:    Alberteen Sam, LCSW Phone Number: 08/03/2021, 10:45 AM  Clinical Narrative:                  Update: 3in1 and rollator ordered with Danielle with [employer] to be shipped to patient's home, referral for max hh services sent to [facility] with [facility].    CSW spoke with patient's son and spouse regarding SNF rec from PT evals. They report they wish for patient to return home health at time of discharge, would [facility] PT, OT, RN aide and social work. They report they will need a rollator (rolling walker with seat) and 3in1 shipped to the home. Patient will need EMS upon discharge.   No further needs identified at this time. CSW ahs sent out home health referrals pending acceptance at this time.   Expected Discharge Plan: [place] Barriers to Discharge: Continued Medical Work up   Patient Goals and CMS Choice Patient states their goals for this hospitalization and ongoing recovery are:: to go home CMS Medicare.gov Compare Post Acute Care list provided to:: Patient Represenative (must comment) (son and spouse) Choice offered to / list presented to : Adult Children  Expected Discharge Plan and Services Expected Discharge Plan: [place]                         DME Arranged: Walker rolling with seat, 3-N-1 DME Agency: [employer] Date DME Agency Contacted: 08/03/21     HH Arranged: PT, OT, RN, Nurse's Aide, Social Work [employer] Agency:  (TBD)        Prior Living Arrangements/Services   Lives with:: Spouse                   Activities of Daily Living      Permission Sought/Granted                  Emotional Assessment       Orientation: : Fluctuating Orientation (Suspected and/or reported Sundowners) Alcohol / Substance Use: Not Applicable Psych Involvement:  No (comment)  Admission diagnosis:  Syncope and collapse [R55] Dehydration [E86.0] Uremia [N19] AKI (acute kidney injury) ([place]) [N17.9] Patient Active Problem List   Diagnosis Date Noted   Hypoglycemia 08/02/2021   Generalized weakness 08/02/2021   Malnutrition of moderate degree 07/31/2021   1st degree AV block    AKI (acute kidney injury) ([place]) 07/21/2021   Altered mental status    Delirium    Syncope and collapse 07/07/2021   Acute kidney injury ([place]) 07/07/2021   Dementia with behavioral disturbance 12/22/2020   Hypertension    Special screening for malignant neoplasms, colon    Benign neoplasm of cecum    Benign neoplasm of ascending colon    Benign neoplasm of transverse colon    Benign neoplasm of sigmoid colon    PCP:  [name], MD Pharmacy:   [facility], [place] [place] [address] Phone: [phone] Fax: [phone]     Social Determinants of Health (SDOH) Interventions    Readmission Risk Interventions No flowsheet data found.

## 2021-08-03 NOTE — Assessment & Plan Note (Addendum)
Patient has dizziness when upright and orthostatic vitals positive past two days.   --Scaled back antihypertensive regimen.   --Added low dose midodrine --Encourage PO intake / hydration

## 2021-08-04 DIAGNOSIS — R55 Syncope and collapse: Secondary | ICD-10-CM | POA: Diagnosis not present

## 2021-08-04 LAB — CBC
HCT: 34.5 % — ABNORMAL LOW (ref 39.0–52.0)
Hemoglobin: 11.3 g/dL — ABNORMAL LOW (ref 13.0–17.0)
MCH: 29.9 pg (ref 26.0–34.0)
MCHC: 32.8 g/dL (ref 30.0–36.0)
MCV: 91.3 fL (ref 80.0–100.0)
Platelets: 154 10*3/uL (ref 150–400)
RBC: 3.78 MIL/uL — ABNORMAL LOW (ref 4.22–5.81)
RDW: 13.4 % (ref 11.5–15.5)
WBC: 7.7 10*3/uL (ref 4.0–10.5)
nRBC: 0 % (ref 0.0–0.2)

## 2021-08-04 LAB — BASIC METABOLIC PANEL
Anion gap: 7 (ref 5–15)
BUN: 22 mg/dL (ref 8–23)
CO2: 29 mmol/L (ref 22–32)
Calcium: 8.8 mg/dL — ABNORMAL LOW (ref 8.9–10.3)
Chloride: 99 mmol/L (ref 98–111)
Creatinine, Ser: 1.33 mg/dL — ABNORMAL HIGH (ref 0.61–1.24)
GFR, Estimated: 55 mL/min — ABNORMAL LOW (ref >60)
Glucose, Bld: 87 mg/dL (ref 70–99)
Potassium: 4 mmol/L (ref 3.5–5.1)
Sodium: 135 mmol/L (ref 135–145)

## 2021-08-04 LAB — GLUCOSE, CAPILLARY
Glucose-Capillary: 101 mg/dL — ABNORMAL HIGH (ref 70–99)
Glucose-Capillary: 108 mg/dL — ABNORMAL HIGH (ref 70–99)
Glucose-Capillary: 136 mg/dL — ABNORMAL HIGH (ref 70–99)
Glucose-Capillary: 96 mg/dL (ref 70–99)
Glucose-Capillary: 98 mg/dL (ref 70–99)

## 2021-08-04 NOTE — Progress Notes (Signed)
Occupational Therapy Treatment Patient Details Name: Dillon Schultz MRN: [id number] DOB: 1946/05/05 Today's Date: 08/04/2021   History of present illness Dillon Schultz is a 76 y.o. male with medical history significant for recent discharge from the hospital on 07/25/2021, dementia with behavioral disturbances, hypertension, dyslipidemia, chronic diastolic CHF, first-degree AV block.,  CKD stage IV.  He presented to the hospital because of witnessed syncopal episode.  Reportedly, since he was discharged home, he had refused to eat or drink.   OT comments  Dillon Schultz was seen for OT/PT co-treatment on this date. Arrived at request from PT to assist with mobility. Pt requires MAX A don B socks at bed level. MAX A x2 bed mobility. Pt tolerates ~10 min static sitting EOB, unable to follow commands for transfers or ADLs. Attempted x2 transfers with near TOTAL A + RW to clear rear - pt with rambling tangential speech, difficult to attend to task. Pt left with all needs in reach. Pt making minimal progress toward goals. Pt continues to benefit from skilled OT services to maximize return to PLOF and minimize risk of future falls, injury, caregiver burden, and readmission. Will continue to follow POC. Discharge recommendation remains appropriate.     Recommendations for follow up therapy are one component of a multi-disciplinary discharge planning process, led by the attending physician.  Recommendations may be updated based on patient status, additional functional criteria and insurance authorization.    Follow Up Recommendations  Skilled nursing-short term rehab (<3 hours/day)    Assistance Recommended at Discharge Frequent or constant Supervision/Assistance  Patient can return home with the following  Two people to help with bathing/dressing/bathroom;Assistance with cooking/housework;Assistance with feeding;Help with stairs or ramp for entrance;Assist for transportation;Direct supervision/assist for financial  management;Direct supervision/assist for medications management;Two people to help with walking and/or transfers   Equipment Recommendations  BSC/3in1    Recommendations for Other Services      Precautions / Restrictions Precautions Precautions: Fall Restrictions Weight Bearing Restrictions: No       Mobility Bed Mobility Overal bed mobility: Needs Assistance Bed Mobility: Supine to Sit, Sit to Supine     Supine to sit: Max assist, +2 for physical assistance Sit to supine: Max assist, +2 for physical assistance        Transfers Overall transfer level: Needs assistance Equipment used: Rolling walker (2 wheels) Transfers: Sit to/from Stand Sit to Stand: Total assist, +2 physical assistance, From elevated surface           General transfer comment: Max multi-modal cues for sequencing with pt's hands placed on RW and physical assistance for foot positioning; pt able to clear the surface of the bed x 2 but only with total assist     Balance Overall balance assessment: Needs assistance Sitting-balance support: No upper extremity supported, Feet unsupported Sitting balance-Leahy Scale: Poor Sitting balance - Comments: Occasional mod A to achieve and maintain neutral sitting position with static sitting balance improving grossly as session progressed   Standing balance support: Bilateral upper extremity supported Standing balance-Leahy Scale: Zero Standing balance comment: Heavy +2 assist to maintain standing position                           ADL either performed or assessed with clinical judgement   ADL Overall ADL's : Needs assistance/impaired  General ADL Comments: MAX A don B socks at bed level. Pt tolerates ~10 min static sitting EOB, unable to follow commands for transfers or ADLs.      Cognition Arousal/Alertness: Lethargic Behavior During Therapy: Flat affect Overall Cognitive Status: No  family/caregiver present to determine baseline cognitive functioning                                 General Comments: very tangential, confused- only alert to self. Thinks it is 18 and that he is in [place]                   Pertinent Vitals/ Pain       Pain Assessment Pain Assessment: No/denies pain   Progress Toward Goals  OT Goals(current goals can now be found in the care plan section)  Progress towards OT goals: Progressing toward goals  Acute Rehab OT Goals Patient Stated Goal: unable to state OT Goal Formulation: With patient Time For Goal Achievement: 08/14/21 Potential to Achieve Goals: Fair ADL Goals Pt Will Perform Grooming: with supervision;sitting;with set-up Pt Will Perform Lower Body Dressing: with min assist;sitting/lateral leans Pt Will Transfer to Toilet: with mod assist;squat pivot transfer;bedside commode  Plan Discharge plan remains appropriate;Frequency remains appropriate    Co-evaluation    PT/OT/SLP Co-Evaluation/Treatment: Yes Reason for Co-Treatment: Necessary to address cognition/behavior during functional activity;For patient/therapist safety;To address functional/ADL transfers PT goals addressed during session: Mobility/safety with mobility OT goals addressed during session: ADL's and self-care      AM-PAC OT "6 Clicks" Daily Activity     Outcome Measure   Help from another person eating meals?: A Little Help from another person taking care of personal grooming?: A Lot Help from another person toileting, which includes using toliet, bedpan, or urinal?: A Lot Help from another person bathing (including washing, rinsing, drying)?: A Lot Help from another person to put on and taking off regular upper body clothing?: A Little Help from another person to put on and taking off regular lower body clothing?: A Lot 6 Click Score: 14    End of Session Equipment Utilized During Treatment: Rolling walker (2 wheels)  OT Visit  Diagnosis: Other abnormalities of gait and mobility (R26.89);Other symptoms and signs involving cognitive function   Activity Tolerance Patient tolerated treatment well   Patient Left in bed;with call bell/phone within reach;with bed alarm set   Nurse Communication Mobility status        Time: 0205-0219 OT Time Calculation (min): 14 min  Charges: OT General Charges $OT Visit: 1 Visit OT Treatments $Therapeutic Activity: 8-22 mins  Dessie Coma, M.S. OTR/L  08/04/21, 4:05 PM  ascom [phone]

## 2021-08-04 NOTE — Progress Notes (Signed)
[facility]) Hospital Liaison note:  This is a pending outpatient-based Palliative Care patient. Will continue to follow for disposition.  Please call with any outpatient palliative questions or concerns.  Thank you, Lorelee Market, LPN [facility] Liaison [phone]

## 2021-08-04 NOTE — Progress Notes (Signed)
Patient very confused, pulled out IV and telemetry cords, refusing reinsertion, unable to re-direct, becomes verbally aggressive when trying to change clothes. Patient has no IV medications ordered, will delay re-insertion until patient has calmed down.

## 2021-08-04 NOTE — Progress Notes (Signed)
Progress Note   Patient: Dillon Schultz WFU:[id number] DOB: 1946/04/21 DOA: 07/30/2021     4 DOS: the patient was seen and examined on 08/04/2021   Brief hospital course: Dillon Schultz is a 76 y.o. male with medical history significant for recent discharge from the hospital on 07/25/2021, dementia with behavioral disturbances, hypertension, dyslipidemia, chronic diastolic CHF, first-degree AV block.,  CKD stage IV.  He presented to the hospital because of witnessed syncopal episode.  Reportedly, since he was discharged home, he had refused to eat or drink.   He was found to have AKI and he also had recurrent hypoglycemic episodes in the hospital.  He was treated with IV fluids and IV dextrose.  2/9: pt again has positive orthostatic vitals and symptomatic with dizziness   Assessment and Plan: * Syncope and collapse- (present on admission) Likely due to orthostatic hypotension and dehydration with AKI.   2/9: Positive orthostatic vitals again today De-escalating antihypertensive therapy slowly.  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   AKI likely due to dehydration.  Renal function improved with IV fluids. Monitor BMP.  Encourage oral hydration.  Orthostatic hypotension Patient has dizziness when upright and orthostatic vitals positive past two days.   --Scaling back antihypertensive regimen.   --Added low dose midodrine --Repeat orthostatic vitals daily  Generalized weakness PT/OT recommend SNF. TOC following. Fall precautions.  2/9: family want to bring pt home with [facility] services, declining SNF 2/10: TOC unable to secure any [facility] agency; family made aware  Hypoglycemia Recurrent.  Likely due to poor PO intake.   Improved. CBG's past 24 hours 91>> 171>> 102>> 89>> 78>> 96>> 101>> 108 Encourage PO intake. Hypoglycemia  protocol.  Malnutrition of moderate degree Due to chronic illness (dementia) as evidenced by mild fat depletion, moderate fat depletion, moderate muscle depletion, severe muscle depletion, percent weight loss. --Dietitian following -Continue Ensure Enlive po TID -Continue MVI with minerals daily -Continue regular diet  -30 ml Prosource Plus BID    1st degree AV block- (present on admission) With sinus bradycardia.   Stable HR's and BP. Monitor.  Hypertension- (present on admission) With soft BP's and orthostatic hypotension noted by OT).  Home regimen included amlodipine 10 mg, hydralazine 25 mg TID.  --Stop amlodipine -- Reduce hydralazine to twice daily --Consider adding midodrine if orthostatics not improved  Dementia with behavioral disturbance- (present on admission) Continue Depakote and Celexa.   Supportive Care.  Delirium Precautions. 2/9: Depakote level low at 26 --Increase Depakote dose to 250 mg BID --Monitor levels        Subjective: Patient awake resting in bed when seen today.  He is wearing mittens on both hands and appears restless pulling at things in the bed.  He offers no acute complaints.  No acute events reported.  Physical Exam: Vitals:   08/03/21 2040 08/04/21 0343 08/04/21 0406 08/04/21 0819  BP: 117/66  115/65 (!) 152/88  Pulse: (!) 55  63 73  Resp: 18  16 16   Temp: 97.8 F (36.6 C)  (!) 97.5 F (36.4 C) 98.3 F (36.8 C)  TempSrc:      SpO2: 100%  96% 98%  Weight:  64.8 kg    Height:       General exam: awake, alert, no acute distress, appears restless Respiratory system: CTAB, no wheezes, rales or rhonchi, normal respiratory effort. Cardiovascular system: normal S1/S2,  RRR  Gastrointestinal system: soft, NT, ND, no HSM felt, +bowel sounds. Central nervous system: no gross focal neurologic deficits, normal speech Extremities: soft mitten restraints on b/l hands, no edema, normal tone Psychiatry: normal mood, congruent affect, judgement  and insight appear normal   Data Reviewed:  Labs reviewed and notable for creatinine 1.33 stable.  Hemoglobin 11.3  Family Communication: None at bedside  Disposition: Status is: Inpatient  Remains inpatient appropriate because: Ongoing orthostatic hypotension.  Home health planning underway as family is declining SNF is recommended for rehab.          Planned Discharge Destination: Home with Home Health.  Family declined SNF     Time spent: 25 minutes  Author: Ezekiel Slocumb, DO 08/04/2021 1:38 PM  For on call review [email or web address].

## 2021-08-04 NOTE — Plan of Care (Signed)
°  Problem: Health Behavior/Discharge Planning: Goal: Ability to manage health-related needs will improve Outcome: Not Progressing   Problem: Activity: Goal: Risk for activity intolerance will decrease Outcome: Not Progressing   Problem: Nutrition: Goal: Adequate nutrition will be maintained Outcome: Not Progressing   Problem: Safety: Goal: Ability to remain free from injury will improve Outcome: Not Progressing

## 2021-08-04 NOTE — TOC Progression Note (Addendum)
Transition of Care [facility]) - Progression Note    Patient Details  Name: Dillon Schultz MRN: [id number] Date of Birth: Dec 05, 1945  Transition of Care [facility]) CM/SW [place], RN Phone Number: 08/04/2021, 9:01 AM  Clinical Narrative:   Reached out to [facility], [facility], [facility], [facility], [facility], [facility], and [facility]. To inquire if any could accept the patient for [facility] PT, OT and RN, [facility] is not able to accept, [facility] is not able to accept, [facility] is not able to accept the patient, [facility] is not able to accept the patient, [facility] is not able to accept the patient, [facility] is unable to accept the patient  I spoke with the son and his wife on the phone and explained that the patient will not have Home health services, He wants the Bedside nurse to call to review the patient and get an update I notified the Unit Secretary and asked to relay the message The patient will need EMS to transport when he goes home  Expected Discharge Plan: [place] Barriers to Discharge: Continued Medical Work up  Expected Discharge Plan and Services Expected Discharge Plan: [place]                         DME Arranged: Walker rolling with seat, 3-N-1 DME Agency: [facility] Date DME Agency Contacted: 08/03/21     HH Arranged: PT, OT, RN, Nurse's Aide, Social Work [employer] Agency:  (TBD)         Social Determinants of Health (SDOH) Interventions    Readmission Risk Interventions No flowsheet data found.

## 2021-08-04 NOTE — Progress Notes (Signed)
Physical Therapy Treatment Patient Details Name: Dillon Schultz MRN: [id number] DOB: May 11, 1946 Today's Date: 08/04/2021   History of Present Illness Dillon Schultz is a 76 y.o. male with medical history significant for recent discharge from the hospital on 07/25/2021, dementia with behavioral disturbances, hypertension, dyslipidemia, chronic diastolic CHF, first-degree AV block.,  CKD stage IV.  He presented to the hospital because of witnessed syncopal episode.  Reportedly, since he was discharged home, he had refused to eat or drink.    PT Comments    Pt required max encouragement and cuing to participate during the session and even with this put forth very little detectible effort. Pt's static sitting at the EOB grossly improved as the session progressed but pt required +2 total assist to come to standing and to remain in standing despite multiple attempts and cues for sequencing.  Pt likely more appropriate for LTC vs SNF secondary to poor cognition and limited to no participation but will continue trial of PT services in hopes to see improvement.  Pt will benefit from PT services in a SNF setting upon discharge to safely address deficits listed in patient problem list for decreased caregiver assistance and eventual return to PLOF.    Recommendations for follow up therapy are one component of a multi-disciplinary discharge planning process, led by the attending physician.  Recommendations may be updated based on patient status, additional functional criteria and insurance authorization.  Follow Up Recommendations  Skilled nursing-short term rehab (<3 hours/day)     Assistance Recommended at Discharge Frequent or constant Supervision/Assistance  Patient can return home with the following Two people to help with walking and/or transfers;Two people to help with bathing/dressing/bathroom;Direct supervision/assist for medications management;Assistance with feeding;Assist for transportation;Help with  stairs or ramp for entrance;Assistance with cooking/housework;Direct supervision/assist for financial management   Equipment Recommendations  None recommended by PT    Recommendations for Other Services       Precautions / Restrictions Precautions Precautions: Fall Restrictions Weight Bearing Restrictions: No     Mobility  Bed Mobility Overal bed mobility: Needs Assistance Bed Mobility: Supine to Sit, Sit to Supine     Supine to sit: Max assist, +2 for physical assistance Sit to supine: Max assist, +2 for physical assistance   General bed mobility comments: +2 Max A for BLE and trunk control    Transfers Overall transfer level: Needs assistance Equipment used: Rolling walker (2 wheels) Transfers: Sit to/from Stand Sit to Stand: Total assist, +2 physical assistance           General transfer comment: Max multi-modal cues for sequencing with pt's hands placed on RW and physical assistance for foot positioning; pt able to clear the surface of the bed x 2 but only with total assist    Ambulation/Gait               General Gait Details: unable/unsafe to attempt   Stairs             Wheelchair Mobility    Modified Rankin (Stroke Patients Only)       Balance Overall balance assessment: Needs assistance Sitting-balance support: No upper extremity supported, Feet unsupported Sitting balance-Leahy Scale: Poor Sitting balance - Comments: Occasional mod A to achieve and maintain neutral sitting position with static sitting balance improving grossly as session progressed   Standing balance support: Bilateral upper extremity supported Standing balance-Leahy Scale: Zero Standing balance comment: Heavy +2 assist to maintain standing position  Cognition Arousal/Alertness: Lethargic Behavior During Therapy: Flat affect Overall Cognitive Status: No family/caregiver present to determine baseline cognitive functioning                                           Exercises Other Exercises Other Exercises: AA/PROM to BLE hips and knees in supine to patient's tolerance    General Comments        Pertinent Vitals/Pain Pain Assessment Pain Assessment: PAINAD Breathing: normal Negative Vocalization: none Facial Expression: smiling or inexpressive Body Language: relaxed Consolability: no need to console PAINAD Score: 0    Home Living                          Prior Function            PT Goals (current goals can now be found in the care plan section) Progress towards PT goals: Not progressing toward goals - comment;PT to reassess next treatment    Frequency    Min 2X/week      PT Plan Current plan remains appropriate    Co-evaluation PT/OT/SLP Co-Evaluation/Treatment: Yes Reason for Co-Treatment: To address functional/ADL transfers;Necessary to address cognition/behavior during functional activity PT goals addressed during session: Mobility/safety with mobility;Strengthening/ROM OT goals addressed during session: ADL's and self-care      AM-PAC PT "6 Clicks" Mobility   Outcome Measure  Help needed turning from your back to your side while in a flat bed without using bedrails?: A Lot Help needed moving from lying on your back to sitting on the side of a flat bed without using bedrails?: Total Help needed moving to and from a bed to a chair (including a wheelchair)?: Total Help needed standing up from a chair using your arms (e.g., wheelchair or bedside chair)?: Total Help needed to walk in hospital room?: Total Help needed climbing 3-5 steps with a railing? : Total 6 Click Score: 7    End of Session Equipment Utilized During Treatment: Gait belt Activity Tolerance: Patient tolerated treatment well Patient left: in bed;with bed alarm set;with call bell/phone within reach;Other (comment) (Safety mittens donned) Nurse Communication: Mobility status PT  Visit Diagnosis: Difficulty in walking, not elsewhere classified (R26.2);Muscle weakness (generalized) (M62.81);Pain Pain - Right/Left: Left Pain - part of body: Leg     Time: 4010-2725 PT Time Calculation (min) (ACUTE ONLY): 23 min  Charges:  $Therapeutic Activity: 8-22 mins                     D. Scott Tonio Seider PT, DPT 08/04/21, 2:39 PM

## 2021-08-05 LAB — GLUCOSE, CAPILLARY
Glucose-Capillary: 65 mg/dL — ABNORMAL LOW (ref 70–99)
Glucose-Capillary: 82 mg/dL (ref 70–99)
Glucose-Capillary: 118 mg/dL — ABNORMAL HIGH (ref 70–99)
Glucose-Capillary: 145 mg/dL — ABNORMAL HIGH (ref 70–99)

## 2021-08-05 MED ORDER — MIDODRINE HCL 5 MG PO TABS: 5.0000 mg | ORAL_TABLET | Freq: Three times a day (TID) | ORAL | 1 refills | Status: DC

## 2021-08-05 MED ORDER — ENSURE ENLIVE PO LIQD: 237.0000 mL | Freq: Three times a day (TID) | ORAL | 12 refills | Status: DC

## 2021-08-05 MED ORDER — ADULT MULTIVITAMIN W/MINERALS CH: 1.0000 | ORAL_TABLET | Freq: Every day | ORAL | Status: DC

## 2021-08-05 MED ORDER — PROSOURCE PLUS PO LIQD: 30.0000 mL | Freq: Two times a day (BID) | ORAL | 2 refills | Status: DC

## 2021-08-05 NOTE — Discharge Summary (Signed)
Physician Discharge Summary   Patient: Dillon Schultz MRN: [id number] DOB: [birth year]  Admit date:     07/30/2021  Discharge date: 08/23/21  Discharge Physician: Ezekiel Slocumb   PCP: [name], MD   Recommendations at discharge:    Follow up on BP and orthostatic hypotension.   Follow up with Primary Care in 1-2 weeks Follow up CBC/BMP in 1-2 weeks Recommend Palliative Care to follow patient Recommend ongoing goals of care discussions given patient's advanced dementia   Discharge Diagnoses: Active Problems:   Dementia with behavioral disturbance   Essential hypertension   1st degree AV block   Malnutrition of moderate degree   Orthostatic hypotension    Hospital Course: Dillon Schultz is a 76 y.o. male with medical history significant for recent discharge from the hospital on 07/25/2021, dementia with behavioral disturbances, hypertension, dyslipidemia, chronic diastolic CHF, first-degree AV block.,  CKD stage IV.  He presented to the hospital because of witnessed syncopal episode.  Reportedly, since he was discharged home, he had refused to eat or drink.   He was found to have AKI and he also had recurrent hypoglycemic episodes in the hospital.  He was treated with IV fluids and IV dextrose.  Orthostatic vitals were positive and patient reported significant dizziness when upright.  Following attempts at repeat orthostatic vitals, pt would refuse to sit up or stand, therefore it is unclear if dizziness and orthostatic hypotension were improved.     2/11: Patient is clinically improved and medically stable for discharge home with family and home health services.  Assessment and Plan: * Syncope and collapse-resolved as of 08/20/2021, (present on admission) Likely due to orthostatic hypotension and dehydration with AKI.   2/9: Positive orthostatic vitals again today De-escalating antihypertensive therapy slowly.  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   AKI likely due to dehydration.  Renal function improved with IV fluids. Monitor BMP.  Encourage oral hydration.  Orthostatic hypotension Patient has dizziness when upright and orthostatic vitals positive past two days.   --Scaled back antihypertensive regimen.   --Added low dose midodrine --Encourage PO intake / hydration  Malnutrition of moderate degree Due to chronic illness (dementia) as evidenced by mild fat depletion, moderate fat depletion, moderate muscle depletion, severe muscle depletion, percent weight loss. --Dietitian following -Continue Ensure Enlive po TID -Continue MVI with minerals daily -Continue regular diet  -30 ml Prosource Plus BID    1st degree AV block- (present on admission) With sinus bradycardia.   Stable HR's and BP. Monitor.  Essential hypertension- (present on admission) With soft BP's and orthostatic hypotension noted by OT).  Home regimen included amlodipine 10 mg, hydralazine 25 mg TID.   --Stop amlodipine -- Reduce hydralazine to twice daily --Added midodrine to help orthostatic sx's  Dementia with behavioral disturbance- (present on admission) Continue Depakote and Celexa.   Supportive Care.  Delirium Precautions. 2/9: Depakote level low at 26 --Increase Depakote dose to 250 mg BID --Monitor levels  Generalized weakness-resolved as of 08/20/2021 PT/OT recommend SNF. TOC following. Fall precautions.  2/9: family want to bring pt home with [facility] services, declining SNF 2/10: TOC unable to secure any [facility] agency; family made aware  Hypoglycemia-resolved as of 08/20/2021 Recurrent.  Likely due to poor PO intake.   Improved. CBG's past 24 hours 91>> 171>> 102>> 89>> 78>> 96>> 101>> 108 Encourage PO  intake. Hypoglycemia protocol.           Consultants: none Procedures  performed: none  Disposition: Home Diet recommendation:  Discharge Diet Orders (From admission, onward)     Start     Ordered   08/05/21 0000  Diet - low sodium heart healthy        08/05/21 1327           Regular diet  DISCHARGE MEDICATION: Allergies as of 08/05/2021       Reactions   Metformin Shortness Of Breath   Made him feel "crazy"    Glucophage [metformin Hcl]    Made him feel "crazy"         Medication List     STOP taking these medications    amLODipine 10 MG tablet Commonly known as: NORVASC       TAKE these medications    citalopram 40 MG tablet Commonly known as: CELEXA Take 40 mg by mouth daily.   divalproex 125 MG DR tablet Commonly known as: DEPAKOTE Take 1 tablet (125 mg total) by mouth every 12 (twelve) hours.   gabapentin 100 MG capsule Commonly known as: NEURONTIN Take 100 mg by mouth 3 (three) times daily.         Discharge Exam: Filed Weights   08/03/21 0500 08/04/21 0343 08/05/21 0500  Weight: 63.5 kg 64.8 kg 63.1 kg   General exam: sleeping but responsive, quickly falls back to sleep, no acute distress HEENT: atraumatic, moist mucus membranes, hearing grossly normal  Respiratory system: CTAB, no wheezes, rales or rhonchi, normal respiratory effort. Cardiovascular system: normal S1/S2, RRR, no JVD, murmurs, rubs, gallops, no pedal edema.   Gastrointestinal system: soft, NT, ND, no HSM felt, +bowel sounds. Central nervous system: no gross focal neurologic deficits, normal but minimal speech Extremities: no edema, normal tone Skin: dry, intact, normal temperature   Condition at discharge: stable  The results of significant diagnostics from this hospitalization (including imaging, microbiology, ancillary and laboratory) are listed below for reference.   Imaging Studies: DG Chest 2 View  Result Date: 08/11/2021 CLINICAL DATA:  Weakness. EXAM: CHEST - 2 VIEW COMPARISON:  July 30, 2021. FINDINGS: The heart size and  mediastinal contours are within normal limits. Both lungs are clear. The visualized skeletal structures are unremarkable. IMPRESSION: No active cardiopulmonary disease. Electronically Signed   By: Marijo Conception M.D.   On: 08/11/2021 08:13   CT HEAD WO CONTRAST (5MM)  Result Date: 08/19/2021 CLINICAL DATA:  Head trauma, minor (Age >= 65y) EXAM: CT HEAD WITHOUT CONTRAST TECHNIQUE: Contiguous axial images were obtained from the base of the skull through the vertex without intravenous contrast. RADIATION DOSE REDUCTION: This exam was performed according to the departmental dose-optimization program which includes automated exposure control, adjustment of the mA and/or kV according to patient size and/or use of iterative reconstruction technique. COMPARISON:  07/30/2021 FINDINGS: Brain: There is no acute intracranial hemorrhage, mass effect, or edema. No new loss of gray-white differentiation. There is no extra-axial fluid collection. Ventricles and sulci are stable in size and configuration. Stable findings of probable chronic microvascular ischemic changes in the cerebral white matter. Vascular: There is atherosclerotic calcification at the skull base. Skull: Calvarium is unremarkable. Sinuses/Orbits: No acute finding. Other: None. IMPRESSION: No evidence of acute intracranial injury. No significant change since recent prior study. Electronically Signed   By: Macy Mis M.D.   On: 08/19/2021 13:31   CT Head Wo Contrast  Result Date: 07/30/2021 CLINICAL DATA:  Altered  mental status. Loss of consciousness. Fall. EXAM: CT HEAD WITHOUT CONTRAST TECHNIQUE: Contiguous axial images were obtained from the base of the skull through the vertex without intravenous contrast. RADIATION DOSE REDUCTION: This exam was performed according to the departmental dose-optimization program which includes automated exposure control, adjustment of the mA and/or kV according to patient size and/or use of iterative reconstruction  technique. COMPARISON:  07/21/2021 FINDINGS: Brain: No evidence of acute infarction, hemorrhage, hydrocephalus, extra-axial collection or mass lesion/mass effect. There is mild diffuse low-attenuation within the subcortical and periventricular white matter compatible with chronic microvascular disease. Prominence of the sulci and the ventricles compatible with brain atrophy. Vascular: No hyperdense vessel or unexpected calcification. Skull: Normal. Negative for fracture or focal lesion. Sinuses/Orbits: No acute finding. Other: None. IMPRESSION: 1. No acute intracranial abnormalities. 2. Chronic small vessel ischemic disease and brain atrophy. Electronically Signed   By: Kerby Moors M.D.   On: 07/30/2021 15:08   CT Cervical Spine Wo Contrast  Result Date: 08/19/2021 CLINICAL DATA:  Neck trauma (Age >= 65y) EXAM: CT CERVICAL SPINE WITHOUT CONTRAST TECHNIQUE: Multidetector CT imaging of the cervical spine was performed without intravenous contrast. Multiplanar CT image reconstructions were also generated. RADIATION DOSE REDUCTION: This exam was performed according to the departmental dose-optimization program which includes automated exposure control, adjustment of the mA and/or kV according to patient size and/or use of iterative reconstruction technique. COMPARISON:  None. FINDINGS: Alignment: No significant listhesis. Skull base and vertebrae: Vertebral body heights are maintained. No acute fracture. Soft tissues and spinal canal: No prevertebral fluid or swelling. No visible canal hematoma. Disc levels: Multilevel degenerative changes are present including disc space narrowing, endplate osteophytes, and facet and uncovertebral hypertrophy. Left paracentral partially calcified disc extrusion extending slightly above disc level at C3-C4. Possible cord compression. Upper chest: Included lung apices are clear. Other: Calcified plaque at the common carotid bifurcations. IMPRESSION: No acute cervical spine  fracture. Cervical spondylosis.  Possible cord compression at C3-C4. Electronically Signed   By: Macy Mis M.D.   On: 08/19/2021 13:35   MR BRAIN WO CONTRAST  Result Date: 07/30/2021 CLINICAL DATA:  Dizziness EXAM: MRI HEAD WITHOUT CONTRAST TECHNIQUE: Multiplanar, multiecho pulse sequences of the brain and surrounding structures were obtained without intravenous contrast. COMPARISON:  No prior MRI head, correlation is made with CT head 07/30/2021 FINDINGS: Brain: No restricted diffusion to suggest acute or subacute infarct. No acute hemorrhage, mass, mass effect, or midline shift. No hydrocephalus or extra-axial collection. Ventriculomegaly, likely secondary to advanced cerebral atrophy for age. Confluent T2 hyperintense signal in the periventricular white matter, likely the sequela of severe chronic small vessel ischemic disease. Vascular: Normal flow voids. Skull and upper cervical spine: Normal marrow signal. Sinuses/Orbits: Negative. Other: None. IMPRESSION: No acute intracranial process. Cerebral atrophy, which appears advanced for age. Electronically Signed   By: Merilyn Baba M.D.   On: 07/30/2021 21:47   MR Cervical Spine Wo Contrast  Result Date: 08/19/2021 CLINICAL DATA:  Multiple falls.  Possible cord compression on CT EXAM: MRI CERVICAL SPINE WITHOUT CONTRAST TECHNIQUE: Multiplanar, multisequence MR imaging of the cervical spine was performed. No intravenous contrast was administered. COMPARISON:  CT 08/19/2021 FINDINGS: Technical Note: Despite efforts by the technologist and patient, motion artifact is present on today's exam and could not be eliminated. This reduces exam sensitivity and specificity. Alignment: Physiologic. Vertebrae: No fracture, evidence of discitis, or bone lesion. Cord: No obvious cord signal abnormality on motion degraded images. The cervical cord is compressed at the C3-4 level  by posterior disc osteophyte complex (series 14, image 11). Posterior Fossa, vertebral  arteries, paraspinal tissues: No gross abnormality on limited exam. Disc levels: C2-C3: Small disc osteophyte complex with mild bilateral facet arthropathy. Mild canal and mild left foraminal stenosis. C3-C4: Prominent central disc osteophyte complex results in deformation of the cervical cord and severe canal stenosis. Right greater than left facet and uncovertebral arthropathy contribute to severe right and moderate-severe left foraminal stenosis. C4-C5: Small posterior disc osteophyte complex with bilateral facet and uncovertebral arthropathy. Findings result in moderate canal stenosis with severe left and moderate right foraminal stenosis. C5-C6: Small disc osteophyte complex with bilateral facet and uncovertebral arthropathy. Findings result in mild-moderate canal stenosis with moderate bilateral foraminal stenosis. C6-C7: Disc osteophyte complex with mild facet and uncovertebral arthropathy. Findings result in mild canal stenosis with moderate bilateral foraminal stenosis. C7-T1: No significant disc protrusion, foraminal stenosis, or canal stenosis. IMPRESSION: 1. Motion degraded exam. 2. 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 No obvious cord signal abnormality/edema on limited exam. 3. At least mild canal stenosis from C2-C3 through C6-C7. Multilevel high-grade bilateral foraminal stenosis, as detailed above. These results will be called to the ordering clinician or representative by the Radiologist Assistant, and communication documented in the PACS or [employer]. Electronically Signed   By: Davina Poke D.O.   On: 08/19/2021 17:08   DG Pelvis Portable  Result Date: 08/19/2021 CLINICAL DATA:  Pt via EMS from home. Pt c/o generalized weakness and multiple falls for the past couple days. EMS reports bilateral leg swelling. Son states decreased appetite. Pt  is baseline per son. Fall EXAM: PORTABLE PELVIS 1-2 VIEWS COMPARISON:  None. FINDINGS: Hips are located., no femoral neck fracture. No pelvic fracture. Degenerate spurring of the spine. IMPRESSION: No acute osseous abnormality. Electronically Signed   By: Suzy Bouchard M.D.   On: 08/19/2021 13:27   US Venous Img Lower Bilateral (DVT)  Result Date: 08/19/2021 CLINICAL DATA:  Bilateral leg pain EXAM: BILATERAL LOWER EXTREMITY VENOUS DOPPLER ULTRASOUND TECHNIQUE: Gray-scale sonography with graded compression, as well as color Doppler and duplex ultrasound were performed to evaluate the lower extremity deep venous systems from the level of the common femoral vein and including the common femoral, femoral, profunda femoral, popliteal and calf veins including the posterior tibial, peroneal and gastrocnemius veins when visible. The superficial great saphenous vein was also interrogated. Spectral Doppler was utilized to evaluate flow at rest and with distal augmentation maneuvers in the common femoral, femoral and popliteal veins. COMPARISON:  Left lower extremity duplex 02/23/2016 FINDINGS: RIGHT LOWER EXTREMITY Common Femoral Vein: No evidence of thrombus. Normal compressibility, respiratory phasicity and response to augmentation. Saphenofemoral Junction: No evidence of thrombus. Normal compressibility and flow on color Doppler imaging. Profunda Femoral Vein: No evidence of thrombus. Normal compressibility and flow on color Doppler imaging. Femoral Vein: No evidence of thrombus. Normal compressibility, respiratory phasicity and response to augmentation. Popliteal Vein: No evidence of thrombus. Normal compressibility, respiratory phasicity and response to augmentation. Calf Veins: No evidence of thrombus. Normal compressibility and flow on color Doppler imaging. Superficial Great Saphenous Vein: No evidence of thrombus. Normal compressibility. Venous Reflux:  None. Other Findings:  Calf soft tissue edema. LEFT LOWER  EXTREMITY Common Femoral Vein: No evidence of thrombus. Normal compressibility, respiratory phasicity and response to augmentation. Saphenofemoral Junction: There is peripheral nonocclusive thrombus consistent with chronic clot. The vessel remains compressible.  Profunda Femoral Vein: No evidence of thrombus. Normal compressibility and flow on color Doppler imaging. Femoral Vein: No evidence of thrombus. Normal compressibility, respiratory phasicity and response to augmentation. Popliteal Vein: No evidence of thrombus. Normal compressibility, respiratory phasicity and response to augmentation. Calf Veins: No evidence of thrombus. Normal compressibility and flow on color Doppler imaging. Superficial Great Saphenous Vein: No evidence of thrombus. Normal compressibility. Venous Reflux:  None. Other Findings:  Calf soft tissue edema. IMPRESSION: 1. Peripheral nonocclusive thrombus at the left saphenofemoral junction is most consistent with chronic clot. 2. No evidence of acute DVT in either lower extremity. 3. Calf soft tissue edema. Electronically Signed   By: Keith Rake M.D.   On: 08/19/2021 19:06   DG Chest Portable 1 View  Result Date: 08/19/2021 CLINICAL DATA:  76 year old male with history of trauma from a fall. EXAM: PORTABLE CHEST 1 VIEW COMPARISON:  Chest x-ray 08/11/2021. FINDINGS: Lung volumes are normal. No consolidative airspace disease. No pleural effusions. No pneumothorax. No pulmonary nodule or mass noted. Pulmonary vasculature and the cardiomediastinal silhouette are within normal limits. Atherosclerosis in the thoracic aorta. IMPRESSION: 1.  No radiographic evidence of acute cardiopulmonary disease. 2. Aortic atherosclerosis. Electronically Signed   By: Vinnie Langton M.D.   On: 08/19/2021 13:16   DG Chest Portable 1 View  Result Date: 07/30/2021 CLINICAL DATA:  Syncope.  History of CHF. EXAM: PORTABLE CHEST 1 VIEW COMPARISON:  07/20/2021 FINDINGS: Heart size is normal. Lungs appear  hyperinflated but clear. There is no pleural effusion or edema. No airspace opacities identified. Review of the visualized osseous structures is unremarkable. IMPRESSION: No active cardiopulmonary abnormalities. Electronically Signed   By: Kerby Moors M.D.   On: 07/30/2021 14:38   DG Abd 2 Views  Result Date: 08/11/2021 CLINICAL DATA:  Decreased urine output.  No bowel movement. EXAM: ABDOMEN - 2 VIEW COMPARISON:  None. FINDINGS: The bowel gas pattern is normal. There is no evidence of free air. No radio-opaque calculi or other significant radiographic abnormality is seen. IMPRESSION: Negative. Electronically Signed   By: Marijo Conception M.D.   On: 08/11/2021 08:12    Microbiology: Results for orders placed or performed during the hospital encounter of 07/30/21  Resp Panel by RT-PCR (Flu A&B, Covid) Nasopharyngeal Swab     Status: None   Collection Time: 07/30/21  2:32 PM   Specimen: Nasopharyngeal Swab; Nasopharyngeal(NP) swabs in vial transport medium  Result Value Ref Range Status   SARS Coronavirus 2 by RT PCR NEGATIVE NEGATIVE Final    Comment: (NOTE) SARS-CoV-2 target nucleic acids are NOT DETECTED.  The SARS-CoV-2 RNA is generally detectable in upper respiratory specimens during the acute phase of infection. The lowest concentration of SARS-CoV-2 viral copies this assay can detect is 138 copies/mL. A negative result does not preclude SARS-Cov-2 infection and should not be used as the sole basis for treatment or other patient management decisions. A negative result may occur with  improper specimen collection/handling, submission of specimen other than nasopharyngeal swab, presence of viral mutation(s) within the areas targeted by this assay, and inadequate number of viral copies(<138 copies/mL). A negative result must be combined with clinical observations, patient history, and epidemiological information. The expected result is Negative.  Fact Sheet for Patients:   EntrepreneurPulse.com.au  Fact Sheet for Healthcare Providers:  IncredibleEmployment.be  This test is no t yet approved or cleared by the [place] FDA and  has been authorized for detection and/or diagnosis of SARS-CoV-2 by FDA under an Emergency Use  Authorization (EUA). This EUA will remain  in effect (meaning this test can be used) for the duration of the COVID-19 declaration under Section 564(b)(1) of the Act, 21 U.S.C.section 360bbb-3(b)(1), unless the authorization is terminated  or revoked sooner.       Influenza A by PCR NEGATIVE NEGATIVE Final   Influenza B by PCR NEGATIVE NEGATIVE Final    Comment: (NOTE) The Xpert Xpress SARS-CoV-2/FLU/RSV plus assay is intended as an aid in the diagnosis of influenza from Nasopharyngeal swab specimens and should not be used as a sole basis for treatment. Nasal washings and aspirates are unacceptable for Xpert Xpress SARS-CoV-2/FLU/RSV testing.  Fact Sheet for Patients: EntrepreneurPulse.com.au  Fact Sheet for Healthcare Providers: IncredibleEmployment.be  This test is not yet approved or cleared by the [place] FDA and has been authorized for detection and/or diagnosis of SARS-CoV-2 by FDA under an Emergency Use Authorization (EUA). This EUA will remain in effect (meaning this test can be used) for the duration of the COVID-19 declaration under Section 564(b)(1) of the Act, 21 U.S.C. section 360bbb-3(b)(1), unless the authorization is terminated or revoked.  Performed at [facility], [address] [place], [place] [address]     Labs: CBC: Recent Labs  Lab 08/17/21 0332 08/19/21 1143 08/20/21 0448  WBC 4.8 7.7 6.1  NEUTROABS 3.2  --   --   HGB 10.6* 10.6* 8.3*  HCT 32.1* 33.8* 26.1*  MCV 89.4 93.4 91.6  PLT 175 198 333   Basic Metabolic Panel: Recent Labs  Lab 08/17/21 0332 08/19/21 1143 08/20/21 0448 08/20/21 1446  08/21/21 0601  NA 142 140 140  --  144  K 4.0 4.4 3.9  --  3.9  CL 107 102 105  --  108  CO2 31 27 29   --  29  GLUCOSE 94 101* 80  --  59*  BUN 13 33* 30*  --  30*  CREATININE 1.09 2.61* 1.94*  --  1.50*  CALCIUM 8.3* 9.0 8.2*  --  8.1*  MG 1.8  --   --  1.8  --   PHOS 2.5  --   --   --   --    Liver Function Tests: Recent Labs  Lab 08/19/21 1143  AST 25  ALT 14  ALKPHOS 64  BILITOT 0.5  PROT 6.0*  ALBUMIN 3.1*   CBG: Recent Labs  Lab 08/17/21 0744 08/17/21 1123  GLUCAP 81 113*    Discharge time spent: less than 30 minutes.  Signed: Ezekiel Slocumb, DO [facility] 08/23/2021

## 2021-08-05 NOTE — Progress Notes (Addendum)
Hypoglycemic Event  CBG: 65 at 0445   Treatment: 4 oz juice/soda  Symptoms: None  Follow-up CBG: Time:0530 CBG Result:82  Possible Reasons for Event: Inadequate meal intake  Comments/MD notified: MD aware of episodes of hypoglycemia; standing orders for hypoglycemia followed.     Rielynn Trulson D Lorah Kalina

## 2021-08-11 ENCOUNTER — Inpatient Hospital Stay
Admission: EM | Admit: 2021-08-11 | Discharge: 2021-08-17 | DRG: 641 | Disposition: A | Discharge status: Home / Self Care | Payer: Medicare Other | Attending: Internal Medicine | Admitting: Internal Medicine

## 2021-08-11 ENCOUNTER — Emergency Department: Payer: Medicare Other

## 2021-08-11 ENCOUNTER — Other Ambulatory Visit: Payer: Self-pay

## 2021-08-11 DIAGNOSIS — I130 Hypertensive heart and chronic kidney disease with heart failure and stage 1 through stage 4 chronic kidney disease, or unspecified chronic kidney disease: Secondary | ICD-10-CM | POA: Diagnosis present

## 2021-08-11 DIAGNOSIS — Z7982 Long term (current) use of aspirin: Secondary | ICD-10-CM

## 2021-08-11 DIAGNOSIS — Z888 Allergy status to other drugs, medicaments and biological substances status: Secondary | ICD-10-CM

## 2021-08-11 DIAGNOSIS — Z66 Do not resuscitate: Secondary | ICD-10-CM | POA: Diagnosis not present

## 2021-08-11 DIAGNOSIS — M6250 Muscle wasting and atrophy, not elsewhere classified, unspecified site: Secondary | ICD-10-CM | POA: Diagnosis present

## 2021-08-11 DIAGNOSIS — D649 Anemia, unspecified: Secondary | ICD-10-CM | POA: Diagnosis not present

## 2021-08-11 DIAGNOSIS — E860 Dehydration: Secondary | ICD-10-CM | POA: Diagnosis present

## 2021-08-11 DIAGNOSIS — N179 Acute kidney failure, unspecified: Secondary | ICD-10-CM | POA: Diagnosis present

## 2021-08-11 DIAGNOSIS — R55 Syncope and collapse: Secondary | ICD-10-CM | POA: Diagnosis present

## 2021-08-11 DIAGNOSIS — R627 Adult failure to thrive: Secondary | ICD-10-CM | POA: Diagnosis present

## 2021-08-11 DIAGNOSIS — R296 Repeated falls: Secondary | ICD-10-CM | POA: Diagnosis present

## 2021-08-11 DIAGNOSIS — N184 Chronic kidney disease, stage 4 (severe): Secondary | ICD-10-CM | POA: Diagnosis present

## 2021-08-11 DIAGNOSIS — W19XXXA Unspecified fall, initial encounter: Secondary | ICD-10-CM | POA: Diagnosis present

## 2021-08-11 DIAGNOSIS — F03911 Unspecified dementia, unspecified severity, with agitation: Secondary | ICD-10-CM | POA: Diagnosis present

## 2021-08-11 DIAGNOSIS — M79604 Pain in right leg: Secondary | ICD-10-CM | POA: Diagnosis not present

## 2021-08-11 DIAGNOSIS — Z79899 Other long term (current) drug therapy: Secondary | ICD-10-CM

## 2021-08-11 DIAGNOSIS — I440 Atrioventricular block, first degree: Secondary | ICD-10-CM | POA: Diagnosis present

## 2021-08-11 DIAGNOSIS — W19XXXD Unspecified fall, subsequent encounter: Secondary | ICD-10-CM | POA: Diagnosis not present

## 2021-08-11 DIAGNOSIS — Z6821 Body mass index (BMI) 21.0-21.9, adult: Secondary | ICD-10-CM | POA: Diagnosis not present

## 2021-08-11 DIAGNOSIS — Z515 Encounter for palliative care: Secondary | ICD-10-CM | POA: Diagnosis not present

## 2021-08-11 DIAGNOSIS — E440 Moderate protein-calorie malnutrition: Secondary | ICD-10-CM | POA: Diagnosis present

## 2021-08-11 DIAGNOSIS — E2740 Unspecified adrenocortical insufficiency: Secondary | ICD-10-CM | POA: Diagnosis present

## 2021-08-11 DIAGNOSIS — K5900 Constipation, unspecified: Secondary | ICD-10-CM | POA: Diagnosis present

## 2021-08-11 DIAGNOSIS — E785 Hyperlipidemia, unspecified: Secondary | ICD-10-CM | POA: Diagnosis present

## 2021-08-11 DIAGNOSIS — E162 Hypoglycemia, unspecified: Secondary | ICD-10-CM | POA: Diagnosis present

## 2021-08-11 DIAGNOSIS — I951 Orthostatic hypotension: Secondary | ICD-10-CM | POA: Diagnosis present

## 2021-08-11 DIAGNOSIS — R531 Weakness: Secondary | ICD-10-CM

## 2021-08-11 DIAGNOSIS — Z20822 Contact with and (suspected) exposure to covid-19: Secondary | ICD-10-CM | POA: Diagnosis present

## 2021-08-11 DIAGNOSIS — I10 Essential (primary) hypertension: Secondary | ICD-10-CM | POA: Diagnosis not present

## 2021-08-11 DIAGNOSIS — Z87891 Personal history of nicotine dependence: Secondary | ICD-10-CM

## 2021-08-11 DIAGNOSIS — F03918 Unspecified dementia, unspecified severity, with other behavioral disturbance: Secondary | ICD-10-CM | POA: Diagnosis present

## 2021-08-11 DIAGNOSIS — M4802 Spinal stenosis, cervical region: Secondary | ICD-10-CM | POA: Diagnosis not present

## 2021-08-11 DIAGNOSIS — I5032 Chronic diastolic (congestive) heart failure: Secondary | ICD-10-CM | POA: Diagnosis present

## 2021-08-11 DIAGNOSIS — Z7189 Other specified counseling: Secondary | ICD-10-CM | POA: Diagnosis not present

## 2021-08-11 DIAGNOSIS — M79605 Pain in left leg: Secondary | ICD-10-CM | POA: Diagnosis not present

## 2021-08-11 DIAGNOSIS — R937 Abnormal findings on diagnostic imaging of other parts of musculoskeletal system: Secondary | ICD-10-CM | POA: Diagnosis not present

## 2021-08-11 LAB — COMPREHENSIVE METABOLIC PANEL
ALT: 12 U/L (ref 0–44)
AST: 24 U/L (ref 15–41)
Albumin: 3.2 g/dL — ABNORMAL LOW (ref 3.5–5.0)
Alkaline Phosphatase: 66 U/L (ref 38–126)
Anion gap: 9 (ref 5–15)
BUN: 53 mg/dL — ABNORMAL HIGH (ref 8–23)
CO2: 29 mmol/L (ref 22–32)
Calcium: 9.2 mg/dL (ref 8.9–10.3)
Chloride: 100 mmol/L (ref 98–111)
Creatinine, Ser: 2.5 mg/dL — ABNORMAL HIGH (ref 0.61–1.24)
GFR, Estimated: 26 mL/min — ABNORMAL LOW (ref >60)
Glucose, Bld: 77 mg/dL (ref 70–99)
Potassium: 4.4 mmol/L (ref 3.5–5.1)
Sodium: 138 mmol/L (ref 135–145)
Total Bilirubin: 0.7 mg/dL (ref 0.3–1.2)
Total Protein: 7.1 g/dL (ref 6.5–8.1)

## 2021-08-11 LAB — URINALYSIS, ROUTINE W REFLEX MICROSCOPIC
Bacteria, UA: NONE SEEN
Bilirubin Urine: NEGATIVE
Glucose, UA: NEGATIVE mg/dL
Ketones, ur: NEGATIVE mg/dL
Leukocytes,Ua: NEGATIVE
Nitrite: NEGATIVE
Protein, ur: NEGATIVE mg/dL
Specific Gravity, Urine: 1.017 (ref 1.005–1.030)
Squamous Epithelial / HPF: NONE SEEN (ref 0–5)
pH: 5 (ref 5.0–8.0)

## 2021-08-11 LAB — CBC WITH DIFFERENTIAL/PLATELET
Abs Immature Granulocytes: 0.01 10*3/uL (ref 0.00–0.07)
Basophils Absolute: 0 10*3/uL (ref 0.0–0.1)
Basophils Relative: 0 %
Eosinophils Absolute: 0 10*3/uL (ref 0.0–0.5)
Eosinophils Relative: 1 %
HCT: 37.3 % — ABNORMAL LOW (ref 39.0–52.0)
Hemoglobin: 11.9 g/dL — ABNORMAL LOW (ref 13.0–17.0)
Immature Granulocytes: 0 %
Lymphocytes Relative: 13 %
Lymphs Abs: 0.6 10*3/uL — ABNORMAL LOW (ref 0.7–4.0)
MCH: 29.2 pg (ref 26.0–34.0)
MCHC: 31.9 g/dL (ref 30.0–36.0)
MCV: 91.6 fL (ref 80.0–100.0)
Monocytes Absolute: 0.4 10*3/uL (ref 0.1–1.0)
Monocytes Relative: 7 %
Neutro Abs: 4.1 10*3/uL (ref 1.7–7.7)
Neutrophils Relative %: 79 %
Platelets: 218 10*3/uL (ref 150–400)
RBC: 4.07 MIL/uL — ABNORMAL LOW (ref 4.22–5.81)
RDW: 13.4 % (ref 11.5–15.5)
WBC: 5.1 10*3/uL (ref 4.0–10.5)
nRBC: 0 % (ref 0.0–0.2)

## 2021-08-11 LAB — RESP PANEL BY RT-PCR (FLU A&B, COVID) ARPGX2
Influenza A by PCR: NEGATIVE
Influenza B by PCR: NEGATIVE
SARS Coronavirus 2 by RT PCR: NEGATIVE

## 2021-08-11 LAB — CK: Total CK: 64 U/L (ref 49–397)

## 2021-08-11 LAB — CBG MONITORING, ED
Glucose-Capillary: 62 mg/dL — ABNORMAL LOW (ref 70–99)
Glucose-Capillary: 110 mg/dL — ABNORMAL HIGH (ref 70–99)
Glucose-Capillary: 148 mg/dL — ABNORMAL HIGH (ref 70–99)

## 2021-08-11 LAB — TROPONIN I (HIGH SENSITIVITY): Troponin I (High Sensitivity): 53 ng/L — ABNORMAL HIGH (ref <18)

## 2021-08-11 MED ORDER — MIDODRINE HCL 5 MG PO TABS
5.0000 mg | ORAL_TABLET | Freq: Three times a day (TID) | ORAL | Status: DC
  Administered 2021-08-16 – 2021-08-17 (×2): 5 mg via ORAL
  Filled 2021-08-11 (×5): qty 1

## 2021-08-11 MED ORDER — SODIUM CHLORIDE 0.9 % IV BOLUS
500.0000 mL | Freq: Once | INTRAVENOUS | Status: AC
  Administered 2021-08-11: 500 mL via INTRAVENOUS

## 2021-08-11 MED ORDER — AMLODIPINE BESYLATE 5 MG PO TABS
2.5000 mg | ORAL_TABLET | Freq: Every day | ORAL | Status: DC
  Administered 2021-08-14: 2.5 mg via ORAL
  Filled 2021-08-11 (×2): qty 1

## 2021-08-11 MED ORDER — POLYETHYLENE GLYCOL 3350 17 G PO PACK
17.0000 g | PACK | Freq: Every day | ORAL | Status: DC
  Administered 2021-08-11 – 2021-08-17 (×3): 17 g via ORAL
  Filled 2021-08-11 (×5): qty 1

## 2021-08-11 MED ORDER — ONDANSETRON HCL 4 MG/2ML IJ SOLN: 4.0000 mg | Freq: Four times a day (QID) | INTRAMUSCULAR | Status: DC | PRN

## 2021-08-11 MED ORDER — DIVALPROEX SODIUM 125 MG PO DR TAB
125.0000 mg | DELAYED_RELEASE_TABLET | Freq: Two times a day (BID) | ORAL | Status: DC
  Administered 2021-08-11: 125 mg via ORAL
  Filled 2021-08-11 (×3): qty 1

## 2021-08-11 MED ORDER — HALOPERIDOL LACTATE 5 MG/ML IJ SOLN
1.0000 mg | Freq: Four times a day (QID) | INTRAMUSCULAR | Status: DC | PRN
  Administered 2021-08-11: 1 mg via INTRAMUSCULAR
  Filled 2021-08-11 (×2): qty 1

## 2021-08-11 MED ORDER — CITALOPRAM HYDROBROMIDE 10 MG PO TABS
40.0000 mg | ORAL_TABLET | Freq: Every day | ORAL | Status: DC
  Administered 2021-08-14 – 2021-08-17 (×3): 40 mg via ORAL
  Filled 2021-08-11 (×4): qty 4

## 2021-08-11 MED ORDER — ENOXAPARIN SODIUM 40 MG/0.4ML IJ SOSY: 40.0000 mg | PREFILLED_SYRINGE | INTRAMUSCULAR | Status: DC

## 2021-08-11 MED ORDER — ONDANSETRON HCL 4 MG PO TABS: 4.0000 mg | ORAL_TABLET | Freq: Four times a day (QID) | ORAL | Status: DC | PRN

## 2021-08-11 MED ORDER — DEXTROSE-NACL 5-0.9 % IV SOLN
1000.0000 mL | Freq: Once | INTRAVENOUS | Status: AC
  Administered 2021-08-11: 1000 mL via INTRAVENOUS

## 2021-08-11 MED ORDER — ENOXAPARIN SODIUM 30 MG/0.3ML IJ SOSY
30.0000 mg | PREFILLED_SYRINGE | INTRAMUSCULAR | Status: DC
  Administered 2021-08-11: 30 mg via SUBCUTANEOUS
  Filled 2021-08-11: qty 0.3

## 2021-08-11 MED ORDER — SODIUM CHLORIDE 0.9 % IV BOLUS
1000.0000 mL | Freq: Once | INTRAVENOUS | Status: AC
  Administered 2021-08-11: 1000 mL via INTRAVENOUS

## 2021-08-11 MED ORDER — ASPIRIN 81 MG PO CHEW
81.0000 mg | CHEWABLE_TABLET | Freq: Every day | ORAL | Status: DC
  Administered 2021-08-14 – 2021-08-17 (×3): 81 mg via ORAL
  Filled 2021-08-11 (×4): qty 1

## 2021-08-11 MED ORDER — GABAPENTIN 100 MG PO CAPS
100.0000 mg | ORAL_CAPSULE | Freq: Three times a day (TID) | ORAL | Status: DC
Start: 2021-08-11 — End: 2021-08-17
  Administered 2021-08-11 – 2021-08-17 (×7): 100 mg via ORAL
  Filled 2021-08-11 (×11): qty 1

## 2021-08-11 MED ORDER — PROSOURCE PLUS PO LIQD
30.0000 mL | Freq: Two times a day (BID) | ORAL | Status: DC
  Administered 2021-08-12 – 2021-08-17 (×4): 30 mL via ORAL
  Filled 2021-08-11 (×14): qty 30

## 2021-08-11 MED ORDER — ACETAMINOPHEN 325 MG PO TABS: 650.0000 mg | ORAL_TABLET | Freq: Four times a day (QID) | ORAL | Status: DC | PRN

## 2021-08-11 MED ORDER — BISACODYL 10 MG RE SUPP
10.0000 mg | Freq: Once | RECTAL | Status: DC
  Filled 2021-08-11: qty 1

## 2021-08-11 MED ORDER — ACETAMINOPHEN 650 MG RE SUPP: 650.0000 mg | Freq: Four times a day (QID) | RECTAL | Status: DC | PRN

## 2021-08-11 MED ORDER — SODIUM CHLORIDE 0.45 % IV SOLN: INTRAVENOUS | Status: DC

## 2021-08-11 MED ORDER — ENSURE ENLIVE PO LIQD
237.0000 mL | Freq: Three times a day (TID) | ORAL | Status: DC
  Administered 2021-08-11 – 2021-08-17 (×8): 237 mL via ORAL

## 2021-08-11 MED ORDER — ADULT MULTIVITAMIN W/MINERALS CH
1.0000 | ORAL_TABLET | Freq: Every day | ORAL | Status: DC
  Administered 2021-08-14 – 2021-08-17 (×3): 1 via ORAL
  Filled 2021-08-11 (×4): qty 1

## 2021-08-11 MED ORDER — SODIUM CHLORIDE 0.9 % IV SOLN: INTRAVENOUS | Status: DC

## 2021-08-11 NOTE — Consult Note (Signed)
Consultation Note Date: 08/11/2021   Patient Name: Dillon Schultz  DOB: 1946/04/20  MRN: [id number]  Age / Sex: 76 y.o., male  PCP: [name], MD Referring Physician: Collier Bullock, MD  Reason for Consultation: Establishing goals of care  HPI/Patient Profile: 76 y.o. male  with past medical history of dementia with behavioral disturbances, hypertension, functional constipation, multiple hospitalizations in the last several weeks admitted on 08/11/2021 with poor PO intake, multiple falls, and constipation. Found to have AKI with creatinine of 2.50 (baseline 1.3). 3 admissions recently. PMT consulted to discuss [place].  Clinical Assessment and Goals of Care: I have reviewed medical records including EPIC notes, labs and imaging, assessed the patient and then spoke with son/HCPOA [name] to discuss diagnosis prognosis, GOC, [name] wishes, disposition and options.  Attempted to speak with patient but he is confused - tells me he is at home. I tell him I will call his son and he tells me he will call him later.   I introduced Palliative Medicine as specialized medical care for people living with serious illness. It focuses on providing relief from the symptoms and stress of a serious illness. The goal is to improve quality of life for both the patient and the family.  [name] tells me patient has had a decline recently. He spends most of his time in bed and when he does attempt to get up he falls. [name] tells me patient is having more frequent incontinent episodes. [name] also says the patient barely eats. He recognizes patient is declining. 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   We discussed patient's current illness and what it means in the larger context of patient's on-going co-morbidities. Discuss decline in function, nutrition, and cognition.   I attempted to elicit values and goals of care important to the patient.    The  difference between aggressive medical intervention and comfort care was considered in light of the patient's goals of care. [name] shares that he and family are interested in all medical efforts to prolong life - including full code interventions and feeding tube if necessary.   Discussed with family the importance of continued conversation with family and the medical providers regarding overall plan of care and treatment options, ensuring decisions are within the context of the patients values and GOCs.    [name] shares that patient has been adamant in the past that he would not want to go to SNF. [name] wants to honor this however he also shares he may be open to short-term rehab  - he will speak to other family members before deciding.   Patient is already connected to outpatient palliative.   Questions and concerns were addressed. The family was encouraged to call with questions or concerns.   Primary Decision Maker NEXT OF KIN/HCPOA son Stavros Cail   SUMMARY OF RECOMMENDATIONS   - family requests full code/full scope intervention - son brings up feeding tube and tells me family would want this if warranted - family considering rehab but undecided - continue outpatient palliative  Code Status/Advance Care Planning: Full code   Discharge Planning: To Be Determined      Primary Diagnoses: Present on Admission:  AKI (acute kidney injury) ([place])  Hypertension  Malnutrition of moderate degree  Falls  Syncope and collapse  Dementia with behavioral disturbance   I have reviewed the medical record, interviewed the patient and family, and examined the patient. The following aspects are pertinent.  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   Social History    Socioeconomic History   Marital status: Married    Spouse name: Not on file   Number of children: Not on file   Years of education: Not on file   Highest education level: Not on file  Occupational History   Not on file  Tobacco Use   Smoking status: Former    Years: 5.00    Types: Cigarettes    Quit date: 06/26/1967    Years since quitting: 54.1   Smokeless tobacco: Never  Vaping Use   Vaping Use: Never used  Substance and Sexual Activity   Alcohol use: Never   Drug use: Never   Sexual activity: Not on file  Other Topics Concern   Not on file  Social History Narrative   Not on file   Social Determinants of Health   Financial Resource Strain: Not on file  Food Insecurity: Not on file  Transportation Needs: Not on file  Physical Activity: Not on file  Stress: Not on file  Social Connections: Not on file   History reviewed. No pertinent family history. Scheduled Meds:  (feeding supplement) PROSource Plus  30 mL Oral BID BM   amLODipine  2.5 mg Oral Daily   aspirin  81 mg Oral Daily   bisacodyl  10 mg Rectal Once   citalopram  40 mg Oral Daily   divalproex  125 mg Oral Q12H   enoxaparin (LOVENOX) injection  30 mg Subcutaneous Q24H   feeding supplement  237 mL Oral TID BM   gabapentin  100 mg Oral TID   midodrine  5 mg Oral TID WC   multivitamin with minerals  1 tablet Oral Daily   polyethylene glycol  17 g Oral Daily   Continuous Infusions: PRN Meds:.acetaminophen **OR** acetaminophen, haloperidol lactate, ondansetron **OR** ondansetron (ZOFRAN) IV Allergies  Allergen Reactions   Metformin Shortness Of Breath    Made him feel "crazy"    Glucophage [Metformin Hcl]     Made him feel "crazy"    Review of Systems  Unable to perform ROS: Dementia   Physical Exam Constitutional:      General: He is not in acute distress.    Appearance: He is ill-appearing.     Comments: Frail, thin, temporal wasting  Cardiovascular:     Rate and Rhythm: Bradycardia present.   Pulmonary:     Effort: Pulmonary effort is normal.  Skin:    General: Skin is warm and dry.  Neurological:     Mental Status: He is disoriented.  Psychiatric:     Comments: Withdrawn, cognitive impairment    Vital Signs: BP (!) 103/58    Pulse (!) 46    Temp 97.9 F (36.6 C) (Oral)    Resp 11    Wt 63 kg    SpO2 100%    BMI 21.12 kg/m  Pain Scale: 0-10   Pain Score: Asleep   SpO2: SpO2: 100 % O2 Device:SpO2: 100 % O2 Flow Rate: .   IO: Intake/output summary:  Intake/Output Summary (Last 24 hours) at 08/11/2021 1609 Last data filed at 08/11/2021 1059 Gross per 24 hour  Intake 1300 ml  Output --  Net 1300 ml    LBM:   Baseline Weight: Weight: 63 kg Most recent weight: Weight: 63 kg     Palliative Assessment/Data: PPS 20%     Juel Burrow, DNP, AGNP-C Palliative Medicine Team [phone] Pager: [phone]

## 2021-08-11 NOTE — ED Notes (Signed)
Provided warm blankets and turned heat up in room.

## 2021-08-11 NOTE — ED Notes (Signed)
During last encounter with pt, pt was also cursing and raising voice. Refused to leave arm out of blankets and grabbed this nurse as described in previous note. Attending provider aware.

## 2021-08-11 NOTE — ED Notes (Signed)
CBG rechecked. First sample was diluted and incorrect. Second sample was good and revealed bloog glucose of 106. Pt sleeping. VS normal. Has promofit placed correctly, still has not voided. Bladder scan earlier revealed 23mL urine. Pt is not retaining. Attending is aware.

## 2021-08-11 NOTE — ED Notes (Signed)
Attempted CBG check 5 times. Unable to get enough blood for reading. Attending provider informed. Pt will need IV team consult as his only working IV has grossly infiltrated into L upper arm. Pt is not combative at this time but is not following commands to keep fingers open and continues to clench fingers while staff attempting to get blood for CBG.

## 2021-08-11 NOTE — ED Provider Notes (Signed)
[facility] Provider Note    Event Date/Time   First MD Initiated Contact with Patient 08/11/21 [phone]     (approximate)   History   Weakness   HPI  Level 5 caveat: History of present illness limited due to dementia  Dillon Schultz is a 76 y.o. male with a history of dementia with behavioral disturbances, hypertension, dyslipidemia, chronic diastolic CHF, first-degree AV block, and CKD stage IV who presents via EMS from home with increased generalized weakness, increased thirst, decreased urine output, and no bowel movement over the last week.  He has also had multiple falls daily.  The patient himself is unable to provide any relevant history.   Physical Exam   Triage Vital Signs: ED Triage Vitals  Enc Vitals Group     BP 08/11/21 0659 (!) 141/77     Pulse Rate 08/11/21 0659 (!) 44     Resp 08/11/21 0659 16     Temp 08/11/21 0659 97.9 F (36.6 C)     Temp Source 08/11/21 0659 Oral     SpO2 08/11/21 0659 97 %     Weight 08/11/21 0658 138 lb 14.2 oz (63 kg)     Height --      Head Circumference --      Peak Flow --      Pain Score 08/11/21 0658 0     Pain Loc --      Pain Edu? --      Excl. in [place]? --     Most recent vital signs: Vitals:   08/11/21 0830 08/11/21 0900  BP: (!) 155/77 (!) 166/81  Pulse: (!) 41 (!) 50  Resp: 14 13  Temp:    SpO2: 99% 100%    General: Alert, no distress.  Oriented x1. CV:  Good peripheral perfusion.  Normal heart sounds. Resp:  Normal effort.  Lungs clear to auscultation bilaterally. Abd:  No distention.  Soft and nontender. Other:  EOMI.  PERRLA.  Motor intact in all extremities.  Normal coordination.   ED Results / Procedures / Treatments   Labs (all labs ordered are listed, but only abnormal results are displayed) Labs Reviewed  CBC WITH DIFFERENTIAL/PLATELET - Abnormal; Notable for the following components:      Result Value   RBC 4.07 (*)    Hemoglobin 11.9 (*)    HCT 37.3 (*)    Lymphs Abs 0.6  (*)    All other components within normal limits  COMPREHENSIVE METABOLIC PANEL - Abnormal; Notable for the following components:   BUN 53 (*)    Creatinine, Ser 2.50 (*)    Albumin 3.2 (*)    GFR, Estimated 26 (*)    All other components within normal limits  TROPONIN I (HIGH SENSITIVITY) - Abnormal; Notable for the following components:   Troponin I (High Sensitivity) 53 (*)    All other components within normal limits  RESP PANEL BY RT-PCR (FLU A&B, COVID) [id number]  URINALYSIS, ROUTINE W REFLEX MICROSCOPIC  CBG MONITORING, ED  TROPONIN I (HIGH SENSITIVITY)     EKG  ED ECG REPORT I, Arta Silence, the attending physician, personally viewed and interpreted this ECG.  Date: 08/11/2021 EKG Time: 0659 Rate: 46 Rhythm: Sinus bradycardia QRS Axis: normal Intervals: normal ST/T Wave abnormalities: Nonspecific ST abnormality Narrative Interpretation: no evidence of acute ischemia    RADIOLOGY  I independently viewed and interpreted the chest x-ray which is negative for focal consolidation, edema, or other acute findings.  I independently  viewed and interpreted the abdominal x-ray which shows a nonobstructive gas pattern and no other acute abnormalities.  PROCEDURES:  Critical Care performed: No  Procedures   MEDICATIONS ORDERED IN ED: Medications  polyethylene glycol (MIRALAX / GLYCOLAX) packet 17 g (has no administration in time range)  sodium chloride 0.9 % bolus 500 mL (0 mLs Intravenous Stopped 08/11/21 0905)  sodium chloride 0.9 % bolus 1,000 mL (1,000 mLs Intravenous New Bag/Given 08/11/21 0902)     IMPRESSION / MDM / ASSESSMENT AND PLAN / ED COURSE  I reviewed the triage vital signs and the nursing notes.  76 year old male with history as noted above presents with increased generalized weakness and multiple falls, increased thirst, and decreased urinary output and constipation over the last week.  I have reviewed the past medical records including the  discharge summary from the hospital service from the patient's last admission on 2/11.  The patient was admitted at that time due to syncope and positive orthostatics as well as AKI.  He also had several hypoglycemic episodes while admitted.  Physical therapy evaluation was performed and SNF was recommended although the family decided to bring the patient home.  The patient is alert but oriented only x1.  He is bradycardic with otherwise normal vital signs.  Physical exam is otherwise unremarkable.    Differential diagnosis includes, but is not limited to, dehydration/hypovolemia, recurrent AKI, other electrolyte disturbance, hypoglycemia or other metabolic cause, symptomatic bradycardia, other cardiac etiology, acute infection.  We will obtain lab work-up, chest and abdominal x-rays, give fluids, and reassess.  The patient is on the cardiac monitor to evaluate for evidence of arrhythmia and/or significant heart rate changes.  ----------------------------------------- 9:06 AM on 08/11/2021 -----------------------------------------  Lab work-up is significant for recurrent AKI with a creatinine of 2.5.  Other labs are unremarkable.  The CBC is normal.  The troponin is chronically minimally elevated.  We have been unable to obtain a urinalysis so far due to the patient's low urinary output.  Additional fluids have been ordered.  I discussed the patient's case with his son [name] over the phone.  He confirms the history given above including generalized weakness, multiple falls, and poor p.o. intake.  Given the AKI and significant dehydration the patient will require admission for further IV hydration.  I also discussed with the son the possibility of SNF placement and he states that the patient has been resistant in the past although the family may be amenable this time.  I consulted Dr. Francine Graven from the hospitalist service for; based on our discussion she agrees to admit the patient.   FINAL  CLINICAL IMPRESSION(S) / ED DIAGNOSES   Final diagnoses:  Constipation  Dehydration  Generalized weakness  Acute kidney injury ([place])     Rx / DC Orders   ED Discharge Orders     None        Note:  This document was prepared using Dragon voice recognition software and may include unintentional dictation errors.    Arta Silence, MD 08/11/21 [phone]

## 2021-08-11 NOTE — ED Notes (Signed)
Pt given meal tray, pt sleeping at this time.

## 2021-08-11 NOTE — ED Notes (Signed)
In and out cath performed. Only several drops of clear dark yellow urine noted in catheter. Not enough urine to send for UA. EDP informed. Will perform bladder scan.

## 2021-08-11 NOTE — ED Triage Notes (Signed)
Pt presents to ER via ems from home.  Per ems and pt's HCPOA, pt has had increased thirst, decreased urine output and no BM x1 week.  Pt also reported to be having frequent falls.  Ems states pt has been having around 5 falls/day.  Pt has hx of dementia.  Was Dc'd from hospital last week for dehydration.  Pt A&O x1.

## 2021-08-11 NOTE — Progress Notes (Signed)
Initial Nutrition Assessment  DOCUMENTATION CODES:   Not applicable  INTERVENTION:   -Continue regular diet -Continue Ensure Enlive po TID, each supplement provides 350 kcal and 20 grams of protein.  -Continue 30 ml Prosource Plus TID, each supplement provides 100 kcals and 15 grams -Continue MVI with minerals daily  NUTRITION DIAGNOSIS:   Predicted suboptimal nutrient intake related to chronic illness (dementia) as evidenced by estimated needs.  GOAL:   Patient will meet greater than or equal to 90% of their needs  MONITOR:   PO intake, Supplement acceptance, Labs, Weight trends, Skin, I & O's  REASON FOR ASSESSMENT:   Malnutrition Screening Tool    ASSESSMENT:   Dillon Schultz is a 76 y.o. male with medical history significant for dementia with behavioral disturbances, hypertension, functional constipation, multiple hospitalizations in the last several weeks who presents again to the emergency room via EMS for evaluation of poor oral intake, constipation, Patient Has Not Had a Bowel Movement in 1 Week and Multiple "Fall at Home described as "passing out".  Per his son he does not lose consciousness when he falls but is very disoriented.  Pt admitted with AKI.   Reviewed I/O's: +1.3 L x 24 hours  Pt unavailable at time of visit. RD unable to obtain further nutrition-related history or complete nutrition-focused physical exam at this time.     Pt familiar to this RD from prior admissions. He has a history of malnutrition, which is likely ongoing. He is on a regular diet, but no meal completions currently available.   Reviewed wt hx; pt has experienced 11.1% wt loss over the past month, which is significant for time frame.   Palliative care following for goals of care. Pt desires full scope of care and would be interested in feeding   Medications reviewed and include miralax.   Labs reviewed: CBGS: 62-145.   Diet Order:   Diet Order             Diet regular Room  service appropriate? Yes; Fluid consistency: Thin  Diet effective now                   EDUCATION NEEDS:   Not appropriate for education at this time  Skin:  Skin Assessment: Skin Integrity Issues: Skin Integrity Issues:: Other (Comment) Other: open scab to lt pretibial  Last BM:  Unknown  Height:   Ht Readings from Last 1 Encounters:  07/30/21 5\' 8"  (1.727 m)    Weight:   Wt Readings from Last 1 Encounters:  08/11/21 63 kg    Ideal Body Weight:  70 kg  BMI:  Body mass index is 21.12 kg/m.  Estimated Nutritional Needs:   Kcal:  1900-2100  Protein:  95-110 grams  Fluid:  > 1.9 L    Loistine Chance, RD, LDN, [place] Registered Dietitian II Certified Diabetes Care and Education Specialist Please refer to [facility] for RD and/or RD on-call/weekend/after hours pager

## 2021-08-11 NOTE — ED Notes (Signed)
EMS IV severely infiltrated in upper arm. Had to be removed.  Attempting to obtain CBG but barely able to get enough blood. Pt resting with eyes closed at this time.

## 2021-08-11 NOTE — ED Notes (Signed)
Informed RN bed assigned 

## 2021-08-11 NOTE — ED Notes (Signed)
Attending provider was just at bedside and evaluated pt.

## 2021-08-11 NOTE — ED Notes (Signed)
Bladder scan reveals ">31mL" urine. Repeated 3 times. Bladder area does not appear or feel distended. See new orders.

## 2021-08-11 NOTE — ED Notes (Signed)
EDP at bedside. Pt believes he is in [place], [place] and the year is 45. Pt calm and cooperative at this time.

## 2021-08-11 NOTE — H&P (Signed)
History and Physical    Patient: Dillon Schultz IWO:[id number] DOB: 1945-07-27 DOA: 08/11/2021 DOS: the patient was seen and examined on 08/11/2021 PCP: [name], MD  Patient coming from: Home  Chief Complaint:  Chief Complaint  Patient presents with   Weakness   Most of the history was obtained from his son over the phone HPI: Dillon Schultz is a 76 y.o. male with medical history significant for dementia with behavioral disturbances, hypertension, functional constipation, multiple hospitalizations in the last several weeks who presents again to the emergency room via EMS for evaluation of poor oral intake, constipation, Patient Has Not Had a Bowel Movement in 1 Week and Multiple "Fall at Home described as "passing out".  Per his son he does not lose consciousness when he falls but is very disoriented. I am unable to do review of systems on this patient due to his underlying dementia.  Review of Systems: unable to review all systems due to the inability of the patient to answer questions. 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 His smoking use included cigarettes. He has never used smokeless tobacco. He reports that he does not drink alcohol and does not use drugs.  Allergies  Allergen Reactions   Metformin  Shortness Of Breath    Made him feel "crazy"    Glucophage [Metformin Hcl]     Made him feel "crazy"     History reviewed. No pertinent family history.  Prior to Admission medications   Medication Sig Start Date End Date Taking? Authorizing Provider  amLODipine (NORVASC) 10 MG tablet Take 10 mg by mouth daily. 08/10/21  Yes [provider]  aspirin 81 MG chewable tablet Chew 81 mg by mouth daily. 07/18/21  Yes [provider]  citalopram (CELEXA) 40 MG tablet Take 40 mg by mouth daily. 03/27/21  Yes [provider]  divalproex (DEPAKOTE) 125 MG DR tablet Take 1 tablet (125 mg total) by mouth every 12 (twelve) hours. 07/11/21  Yes Fritzi Mandes, MD  feeding supplement (ENSURE ENLIVE / ENSURE PLUS) LIQD Take 237 mLs by mouth 3 (three) times daily between meals. 08/05/21  Yes Nicole Kindred A, DO  gabapentin (NEURONTIN) 100 MG capsule Take 100 mg by mouth 3 (three) times daily. 07/18/21  Yes [provider]  hydrALAZINE (APRESOLINE) 25 MG tablet Take 1 tablet (25 mg total) by mouth 2 (two) times daily. 07/11/21  Yes Fritzi Mandes, MD  midodrine (PROAMATINE) 5 MG tablet Take 1 tablet (5 mg total) by mouth 3 (three) times daily with meals. 08/05/21  Yes Ezekiel Slocumb, DO  Multiple Vitamin (MULTIVITAMIN WITH MINERALS) TABS tablet Take 1 tablet by mouth daily. 08/06/21  Yes Nicole Kindred A, DO  Nutritional Supplements (,FEEDING  SUPPLEMENT, PROSOURCE PLUS) liquid Take 30 mLs by mouth 2 (two) times daily between meals. 08/05/21  Yes Nicole Kindred A, DO  azelastine (ASTELIN) 137 MCG/SPRAY nasal spray Place 2 sprays into the nose 2 (two) times daily. Reported on 12/08/2015  12/22/20  [provider]  cetirizine (ZYRTEC) 10 MG tablet Take 10 mg by mouth daily. Reported on 12/08/2015  12/22/20  [provider]  furosemide (LASIX) 20 MG tablet Take 20 mg by mouth daily.  12/22/20  [provider]    Physical Exam: Vitals:   08/11/21 0815 08/11/21 0830  08/11/21 0900 08/11/21 0930  BP:  (!) 155/77 (!) 166/81 (!) 141/71  Pulse: (!) 39 (!) 41 (!) 50 (!) 42  Resp: 16 14 13 18   Temp:      TempSrc:      SpO2: 98% 99% 100% 100%  Weight:       Physical Exam Vitals and nursing note reviewed.  Constitutional:      Appearance: Normal appearance.     Comments: Oriented only to person not to place or time  HENT:     Head: Normocephalic and atraumatic.     Nose: Nose normal.     Mouth/Throat:     Mouth: Mucous membranes are dry.  Eyes:     Pupils: Pupils are equal, round, and reactive to light.  Cardiovascular:     Rate and Rhythm: Bradycardia present.  Pulmonary:     Effort: Pulmonary effort is normal.     Breath sounds: Normal breath sounds.  Abdominal:     General: Abdomen is flat. Bowel sounds are normal.     Palpations: Abdomen is soft.  Musculoskeletal:        General: Normal range of motion.     Cervical back: Normal range of motion and neck supple.  Skin:    General: Skin is warm and dry.  Neurological:     General: No focal deficit present.     Mental Status: He is alert.     Comments: Oriented only to person  Psychiatric:     Comments: Cooperative with exam     Data Reviewed: Notes from primary care and specialist visits, past discharge summaries. Prior diagnostic testing as applicable to current admission diagnoses Updated medications and problem lists for reconciliation ED course, including vitals, labs, imaging, treatment and response to treatment Triage notes and ED providers notes BUN is elevated at 53 with serum creatinine of 2.50 compared to baseline of 1.3 Chest x-ray reviewed by me shows no evidence of active cardiopulmonary disease Abdominal x-ray reviewed by me shows no evidence of free air.  Normal bowel gas pattern. 12Lead EKG reviewed by me shows sinus bradycardia There are no new results to review at this time.  Assessment and Plan: Principal Problem:   AKI (acute kidney injury) ([place]) Active  Problems:   Dementia with behavioral disturbance   Hypertension   Syncope and collapse   Malnutrition of moderate degree   Falls    Acute kidney injury Patient has a baseline serum creatinine of 1.3 and today on admission it is 2.50 Worsening renal function is secondary to poor oral intake and dehydration. IV fluid hydration Check CK levels Repeat renal parameters in a.m.    Falls/syncope and collapse Patient was recently hospitalized and at that time was noted to have orthostatic hypotension which could explain his symptoms. He remains bradycardic and has been evaluated by cardiology, an event monitor was considered but due to patient's dementia was not  done due to patient's mental status. He was started on midodrine and adjustments were made to his blood pressure medications Repeat orthostatic blood pressure checks Fall precautions Telemetry monitoring     Moderate malnutrition Secondary to poor oral intake from advanced dementia Continue nutritional supplements     Dementia with behavioral disturbances Continue Depakote and Celexa   Advance Care Planning:   Code Status: Full Code   Consults: Palliative care  Family Communication: Greater than 50% of time was spent discussing patient's condition and plan of care with his son [name] over the phone.  All questions and concerns have been addressed  Severity of Illness: The appropriate patient status for this patient is INPATIENT. Inpatient status is judged to be reasonable and necessary in order to provide the required intensity of service to ensure the patient's safety. The patient's presenting symptoms, physical exam findings, and initial radiographic and laboratory data in the context of their chronic comorbidities is felt to place them at high risk for further clinical deterioration. Furthermore, it is not anticipated that the patient will be medically stable for discharge from the hospital within 2 midnights of  admission.   * I certify that at the point of admission it is my clinical judgment that the patient will require inpatient hospital care spanning beyond 2 midnights from the point of admission due to high intensity of service, high risk for further deterioration and high frequency of surveillance required.*  Author: Collier Bullock, MD 08/11/2021 10:08 AM  For on call review [email or web address].

## 2021-08-11 NOTE — ED Notes (Signed)
CN has spoken with floor about assigning an RN as pt has assigned (dirty) bed.

## 2021-08-11 NOTE — ED Notes (Signed)
Attempted to insert second IV to draw repeat troponin because pt had removed second IV that was placed earlier and EMS IV does not draw back. Pt became extremely combative and grabbed this nurse arm. Unable to place IV or draw troponin at this time.

## 2021-08-11 NOTE — ED Notes (Signed)
Informed attending that unable to get second troponin and second IV at this time. Pt will likely need U/S IV and sedation to tolerate IV placement.

## 2021-08-11 NOTE — ED Notes (Signed)
Pt in xray

## 2021-08-11 NOTE — ED Notes (Signed)
IV team RN at bedside. PIV placed and CBG checked using venous blood. CBG found to be 62. U/S IV will also be placed. Attending provider informed to see if would order D5LR or D5NS.

## 2021-08-12 DIAGNOSIS — W19XXXD Unspecified fall, subsequent encounter: Secondary | ICD-10-CM

## 2021-08-12 DIAGNOSIS — F03918 Unspecified dementia, unspecified severity, with other behavioral disturbance: Secondary | ICD-10-CM | POA: Diagnosis not present

## 2021-08-12 DIAGNOSIS — I10 Essential (primary) hypertension: Secondary | ICD-10-CM

## 2021-08-12 DIAGNOSIS — E440 Moderate protein-calorie malnutrition: Secondary | ICD-10-CM

## 2021-08-12 DIAGNOSIS — E860 Dehydration: Secondary | ICD-10-CM

## 2021-08-12 DIAGNOSIS — N179 Acute kidney failure, unspecified: Secondary | ICD-10-CM | POA: Diagnosis not present

## 2021-08-12 DIAGNOSIS — K5900 Constipation, unspecified: Secondary | ICD-10-CM

## 2021-08-12 LAB — CBC
HCT: 34.3 % — ABNORMAL LOW (ref 39.0–52.0)
Hemoglobin: 11.1 g/dL — ABNORMAL LOW (ref 13.0–17.0)
MCH: 29.5 pg (ref 26.0–34.0)
MCHC: 32.4 g/dL (ref 30.0–36.0)
MCV: 91.2 fL (ref 80.0–100.0)
Platelets: 179 10*3/uL (ref 150–400)
RBC: 3.76 MIL/uL — ABNORMAL LOW (ref 4.22–5.81)
RDW: 13.3 % (ref 11.5–15.5)
WBC: 4.3 10*3/uL (ref 4.0–10.5)
nRBC: 0 % (ref 0.0–0.2)

## 2021-08-12 LAB — BASIC METABOLIC PANEL
Anion gap: 7 (ref 5–15)
BUN: 44 mg/dL — ABNORMAL HIGH (ref 8–23)
CO2: 28 mmol/L (ref 22–32)
Calcium: 8.8 mg/dL — ABNORMAL LOW (ref 8.9–10.3)
Chloride: 105 mmol/L (ref 98–111)
Creatinine, Ser: 1.79 mg/dL — ABNORMAL HIGH (ref 0.61–1.24)
GFR, Estimated: 39 mL/min — ABNORMAL LOW (ref >60)
Glucose, Bld: 86 mg/dL (ref 70–99)
Potassium: 3.8 mmol/L (ref 3.5–5.1)
Sodium: 140 mmol/L (ref 135–145)

## 2021-08-12 LAB — GLUCOSE, CAPILLARY
Glucose-Capillary: 73 mg/dL (ref 70–99)
Glucose-Capillary: 127 mg/dL — ABNORMAL HIGH (ref 70–99)
Glucose-Capillary: 48 mg/dL — ABNORMAL LOW (ref 70–99)
Glucose-Capillary: 57 mg/dL — ABNORMAL LOW (ref 70–99)
Glucose-Capillary: 113 mg/dL — ABNORMAL HIGH (ref 70–99)

## 2021-08-12 MED ORDER — BISACODYL 10 MG RE SUPP: 10.0000 mg | Freq: Every day | RECTAL | Status: DC | PRN

## 2021-08-12 MED ORDER — BISACODYL 10 MG RE SUPP
10.0000 mg | Freq: Every day | RECTAL | Status: DC
Start: 2021-08-12 — End: 2021-08-12

## 2021-08-12 MED ORDER — HALOPERIDOL LACTATE 5 MG/ML IJ SOLN
5.0000 mg | Freq: Once | INTRAMUSCULAR | Status: AC
  Administered 2021-08-12: 5 mg via INTRAMUSCULAR

## 2021-08-12 MED ORDER — HALOPERIDOL LACTATE 5 MG/ML IJ SOLN
2.0000 mg | Freq: Four times a day (QID) | INTRAMUSCULAR | Status: DC | PRN
  Administered 2021-08-12: 2 mg via INTRAMUSCULAR
  Filled 2021-08-12: qty 1

## 2021-08-12 MED ORDER — SORBITOL 70 % SOLN
30.0000 mL | Status: AC
  Filled 2021-08-12 (×2): qty 30

## 2021-08-12 MED ORDER — HALOPERIDOL LACTATE 5 MG/ML IJ SOLN: 5.0000 mg | Freq: Once | INTRAMUSCULAR | Status: DC

## 2021-08-12 MED ORDER — DEXTROSE-NACL 5-0.9 % IV SOLN: INTRAVENOUS | Status: DC

## 2021-08-12 MED ORDER — MIRTAZAPINE 15 MG PO TBDP
7.5000 mg | ORAL_TABLET | Freq: Every day | ORAL | Status: DC
  Administered 2021-08-12 – 2021-08-16 (×5): 7.5 mg via ORAL
  Filled 2021-08-12 (×4): qty 1
  Filled 2021-08-12: qty 0.5
  Filled 2021-08-12 (×2): qty 1

## 2021-08-12 MED ORDER — ENOXAPARIN SODIUM 40 MG/0.4ML IJ SOSY
40.0000 mg | PREFILLED_SYRINGE | INTRAMUSCULAR | Status: DC
Start: 2021-08-12 — End: 2021-08-17
  Administered 2021-08-12 – 2021-08-16 (×5): 40 mg via SUBCUTANEOUS
  Filled 2021-08-12 (×5): qty 0.4

## 2021-08-12 MED ORDER — VALPROATE SODIUM 100 MG/ML IV SOLN
62.5000 mg | Freq: Four times a day (QID) | INTRAVENOUS | Status: DC
  Administered 2021-08-12 – 2021-08-14 (×7): 62.5 mg via INTRAVENOUS
  Filled 2021-08-12 (×10): qty 0.63

## 2021-08-12 MED ORDER — DEXTROSE 50 % IV SOLN
25.0000 g | INTRAVENOUS | Status: AC
  Administered 2021-08-12: 25 g via INTRAVENOUS
  Filled 2021-08-12: qty 50

## 2021-08-12 MED ORDER — VALPROATE SODIUM 100 MG/ML IV SOLN
42.0000 mg | Freq: Four times a day (QID) | INTRAVENOUS | Status: DC
  Filled 2021-08-12 (×3): qty 0.42

## 2021-08-12 MED ORDER — SODIUM CHLORIDE 0.9 % IV SOLN: INTRAVENOUS | Status: DC

## 2021-08-12 MED ORDER — VALPROATE SODIUM 100 MG/ML IV SOLN
125.0000 mg | Freq: Two times a day (BID) | INTRAVENOUS | Status: DC
  Filled 2021-08-12 (×2): qty 1.25

## 2021-08-12 NOTE — Progress Notes (Signed)
Patient has been very aggressive and agitated. He pulled out both his PIV's and would not let staff check his IV sites. He snatched off his telemetry monitor and slung it at nurse. Nurse tech also tried and he slung the telemetry monitor at her. Messaged the NP on call about discontinuing his telemetry and an order either for oral or IM medication for agitation. The order for telemetry will not be discontinued. After waiting ten minutes the nurse re-approached patient and was able to provide incontinence care and attached the telemetry monitor to patients chest without patient being combative. Patient is now resting in bed with eyes closed.

## 2021-08-12 NOTE — Progress Notes (Signed)
Hypoglycemic Event  CBG: 48  Treatment: D50 50 mL (25 gm)  Symptoms: None  Follow-up CBG: Time:1329 CBG Result:127  Possible Reasons for Event: Inadequate meal intake  Comments/MD notified:Grandville Silos MD    Montel Culver

## 2021-08-12 NOTE — Progress Notes (Signed)
Patient drank some sips of his ensure but would not drink anymore.

## 2021-08-12 NOTE — Progress Notes (Signed)
PHARMACIST - PHYSICIAN COMMUNICATION  CONCERNING:  Enoxaparin (Lovenox) for DVT Prophylaxis    RECOMMENDATION: Patient was prescribed enoxaparin 30mg  q24 hours for VTE prophylaxis.   Filed Weights   08/11/21 0658 08/11/21 2115  Weight: 63 kg (138 lb 14.2 oz) 64 kg (141 lb 1.5 oz)    Body mass index is 21.45 kg/m.  Estimated Creatinine Clearance: 31.3 mL/min (A) (by C-G formula based on SCr of 1.79 mg/dL (H)).   Patient is candidate for enoxaparin 40mg  every 24 hours based on CrCl >9ml/min and Weight >45kg  DESCRIPTION: Pharmacy has adjusted enoxaparin dose per [facility] policy.  Patient is now receiving enoxaparin 40 mg every 24 hours   Benita Gutter 08/12/2021 8:09 AM

## 2021-08-12 NOTE — Progress Notes (Signed)
CBG 57, 6 oz of orange juice given

## 2021-08-12 NOTE — Progress Notes (Signed)
Patient refusing medications this am. Was able to obtain a blood sugar and vitals. Patient has no iv access and refusing to have an IV started. Order for soap suds enema, patient is refusing. MD notified.

## 2021-08-12 NOTE — Progress Notes (Signed)
Hypoglycemic Event  CBG: 57  Treatment: 4 oz juice/soda  Symptoms: None  Follow-up CBG: Time:1315 CBG Result:48  Possible Reasons for Event: Inadequate meal intake  Comments/MD notified:Grandville Silos MD    Montel Culver

## 2021-08-12 NOTE — Progress Notes (Signed)
PROGRESS NOTE    Dillon Schultz  DSK:[id number] DOB: 1946/06/10 DOA: 08/11/2021 PCP: [name], MD    Chief Complaint  Dillon Schultz presents with   Weakness    Brief Narrative:  Dillon Schultz is a unfortunate 76 year old African-American gentleman history of dementia with behavioral disturbances, hypertension, functional constipation, multiple hospitalizations over the past several weeks presenting to the ED via EMS for poor oral intake/FTT, constipation noted not of had a bowel movement in a week multiple falls at home described as passing out however Dillon Schultz did not lose consciousness when he fell but very disoriented.  Dillon Schultz admitted with constipation, acute kidney injury, dehydration, dementia with behavioral disturbances.  Dillon Schultz admitted placed on IV fluids home regimen medications continued as well as IV Haldol for agitation.  Dillon Schultz noted to have been agitated, pulling out lines, refusing medications.  Dillon Schultz with poor oral intake.  Dillon Schultz seen by palliative care who discussed with family and decision was made for full scope of care including full code.   Assessment & Plan:   Principal Problem:   AKI (acute kidney injury) ([place]) Active Problems:   Dementia with behavioral disturbance   Hypertension   Syncope and collapse   Malnutrition of moderate degree   Falls  #1 acute kidney injury -Baseline creatinine of 1.3 and presented with a creatinine of 2.50 on admission. -Likely secondary to prerenal azotemia in the setting of poor oral intake and dehydration. -Dillon Schultz placed on IV fluids however receiving fluids intermittently as Dillon Schultz pulling out IVs. -Renal function slowly improving down with hydration. -IV fluids, supportive care.  2.  Dehydration -IV fluids.  3.  Fall/syncope and collapse -Dillon Schultz noted during recent hospitalization to have orthostatic hypotension which likely may be reason for his fall/syncope and collapse, noted to have been bradycardic and evaluated by  cardiology event monitor considered however due to Dillon Schultz's dementia was not advised. -Dillon Schultz on midodrine for orthostasis which we will continue. -IV fluids, supportive care.  4.  Protein calorie malnutrition/ FTT -Secondary to poor oral intake from advanced dementia. -Placed on Remeron at bedtime to see whether that might stimulate appetite. -Nutrition supplementation.  5.  Dementia with behavioral disturbances/failure to thrive -Dillon Schultz noted with dementia with agitation, poor oral intake, dehydration, failure to thrive with multiple hospitalizations. -Dillon Schultz seems to slowly be declining. -Dillon Schultz refusing oral medications and as such we will change Depakote to IV.  Continue Celexa. -May need to place on Zyprexa 5 mg at 5 PM daily if Dillon Schultz will agree to oral intake. -Remeron nightly. -Dillon Schultz seen in consultation by palliative care who discussed with family and family wanted full scope of care including full code and PEG tube placement if needed. -Dillon Schultz however likely a poor candidate for PEG tube placement due to dementia with behavioral disturbances and agitation and at this time would not recommend PEG tube placement. -Palliative care following.  6.  Hypertension -Dillon Schultz refusing oral medications. -IV hydralazine as needed.  #7.  Constipation -Dillon Schultz refusing MiraLAX. -Enema ordered but Dillon Schultz refused. -Place on daily Dulcolax suppositories.  -    DVT prophylaxis: Lovenox Code Status: Full Family Communication: No family at bedside Disposition:   Status is: Inpatient Remains inpatient appropriate because: Severity of illness           Consultants:  Palliative care: Kathie Rhodes, NP 08/11/2021  Procedures:  Chest x-ray 08/11/2021 Abdominal films 08/11/2021  Antimicrobials:  None   Subjective: Dillon Schultz noted to have been agitated overnight and received Haldol, agitated this morning pulling out IVs and lines  received another dose of Haldol and  sedated at the time of my evaluation.  Per RN Dillon Schultz with poor oral intake.  Dillon Schultz noted to be refusing oral medications as well as enema.  Objective: Vitals:   08/11/21 2115 08/12/21 0425 08/12/21 0814 08/12/21 1301  BP: (!) 132/56 (!) 171/84 (!) 179/69 (!) 162/94  Pulse: (!) 41 (!) 51 (!) 58 (!) 58  Resp: 20 20 18 17   Temp: 98.5 F (36.9 C) 98.9 F (37.2 C)  98.3 F (36.8 C)  TempSrc: Oral     SpO2:  100%  100%  Weight: 64 kg     Height: 5\' 8"  (1.727 m)       Intake/Output Summary (Last 24 hours) at 08/12/2021 1540 Last data filed at 08/12/2021 1526 Gross per 24 hour  Intake 673.55 ml  Output 250 ml  Net 423.55 ml   Filed Weights   08/11/21 0658 08/11/21 2115  Weight: 63 kg 64 kg    Examination:  General exam: Asleep.  Sedated. Respiratory system: Clear to auscultation bilaterally.  No wheezes, no crackles, no rhonchi. Respiratory effort normal. Cardiovascular system: S1 & S2 heard, RRR. No JVD, murmurs, rubs, gallops or clicks. No pedal edema. Gastrointestinal system: Abdomen is nondistended, soft and nontender. No organomegaly or masses felt. Normal bowel sounds heard. Central nervous system: Sedated. No focal neurological deficits. Extremities: Symmetric 5 x 5 power. Skin: No rashes, lesions or ulcers Psychiatry: Judgement and insight appear normal. Mood & affect appropriate.     Data Reviewed: I have personally reviewed following labs and imaging studies  CBC: Recent Labs  Lab 08/11/21 0714 08/12/21 0456  WBC 5.1 4.3  NEUTROABS 4.1  --   HGB 11.9* 11.1*  HCT 37.3* 34.3*  MCV 91.6 91.2  PLT 218 409    Basic Metabolic Panel: Recent Labs  Lab 08/11/21 0714 08/12/21 0456  NA 138 140  K 4.4 3.8  CL 100 105  CO2 29 28  GLUCOSE 77 86  BUN 53* 44*  CREATININE 2.50* 1.79*  CALCIUM 9.2 8.8*    GFR: Estimated Creatinine Clearance: 31.3 mL/min (A) (by C-G formula based on SCr of 1.79 mg/dL (H)).  Liver Function Tests: Recent Labs  Lab  08/11/21 0714  AST 24  ALT 12  ALKPHOS 66  BILITOT 0.7  PROT 7.1  ALBUMIN 3.2*    CBG: Recent Labs  Lab 08/11/21 1801 08/12/21 0826 08/12/21 1259 08/12/21 1314 08/12/21 1328  GLUCAP 148* 73 57* 48* 127*     Recent Results (from the past 240 hour(s))  Resp Panel by RT-PCR (Flu A&B, Covid) Nasopharyngeal Swab     Status: None   Collection Time: 08/11/21  7:14 AM   Specimen: Nasopharyngeal Swab; Nasopharyngeal(NP) swabs in vial transport medium  Result Value Ref Range Status   SARS Coronavirus 2 by RT PCR NEGATIVE NEGATIVE Final    Comment: (NOTE) SARS-CoV-2 target nucleic acids are NOT DETECTED.  The SARS-CoV-2 RNA is generally detectable in upper respiratory specimens during the acute phase of infection. The lowest concentration of SARS-CoV-2 viral copies this assay can detect is 138 copies/mL. A negative result does not preclude SARS-Cov-2 infection and should not be used as the sole basis for treatment or other Dillon Schultz management decisions. A negative result may occur with  improper specimen collection/handling, submission of specimen other than nasopharyngeal swab, presence of viral mutation(s) within the areas targeted by this assay, and inadequate number of viral copies(<138 copies/mL). A negative result must be combined with clinical observations,  Dillon Schultz history, and epidemiological information. The expected result is Negative.  Fact Sheet for Patients:  EntrepreneurPulse.com.au  Fact Sheet for Healthcare Providers:  IncredibleEmployment.be  This test is no t yet approved or cleared by the [place] FDA and  has been authorized for detection and/or diagnosis of SARS-CoV-2 by FDA under an Emergency Use Authorization (EUA). This EUA will remain  in effect (meaning this test can be used) for the duration of the COVID-19 declaration under Section 564(b)(1) of the Act, 21 U.S.C.section 360bbb-3(b)(1), unless the  authorization is terminated  or revoked sooner.       Influenza A by PCR NEGATIVE NEGATIVE Final   Influenza B by PCR NEGATIVE NEGATIVE Final    Comment: (NOTE) The Xpert Xpress SARS-CoV-2/FLU/RSV plus assay is intended as an aid in the diagnosis of influenza from Nasopharyngeal swab specimens and should not be used as a sole basis for treatment. Nasal washings and aspirates are unacceptable for Xpert Xpress SARS-CoV-2/FLU/RSV testing.  Fact Sheet for Patients: EntrepreneurPulse.com.au  Fact Sheet for Healthcare Providers: IncredibleEmployment.be  This test is not yet approved or cleared by the [place] FDA and has been authorized for detection and/or diagnosis of SARS-CoV-2 by FDA under an Emergency Use Authorization (EUA). This EUA will remain in effect (meaning this test can be used) for the duration of the COVID-19 declaration under Section 564(b)(1) of the Act, 21 U.S.C. section 360bbb-3(b)(1), unless the authorization is terminated or revoked.  Performed at [facility], [address], [place], [place] [address]          Radiology Studies: DG Chest 2 View  Result Date: 08/11/2021 CLINICAL DATA:  Weakness. EXAM: CHEST - 2 VIEW COMPARISON:  July 30, 2021. FINDINGS: The heart size and mediastinal contours are within normal limits. Both lungs are clear. The visualized skeletal structures are unremarkable. IMPRESSION: No active cardiopulmonary disease. Electronically Signed   By: Marijo Conception M.D.   On: 08/11/2021 08:13   DG Abd 2 Views  Result Date: 08/11/2021 CLINICAL DATA:  Decreased urine output.  No bowel movement. EXAM: ABDOMEN - 2 VIEW COMPARISON:  None. FINDINGS: The bowel gas pattern is normal. There is no evidence of free air. No radio-opaque calculi or other significant radiographic abnormality is seen. IMPRESSION: Negative. Electronically Signed   By: Marijo Conception M.D.   On: 08/11/2021 08:12         Scheduled Meds:  (feeding supplement) PROSource Plus  30 mL Oral BID BM   amLODipine  2.5 mg Oral Daily   aspirin  81 mg Oral Daily   bisacodyl  10 mg Rectal Once   citalopram  40 mg Oral Daily   enoxaparin (LOVENOX) injection  40 mg Subcutaneous Q24H   feeding supplement  237 mL Oral TID BM   gabapentin  100 mg Oral TID   midodrine  5 mg Oral TID WC   mirtazapine  7.5 mg Oral QHS   multivitamin with minerals  1 tablet Oral Daily   polyethylene glycol  17 g Oral Daily   Continuous Infusions:  dextrose 5 % and 0.9% NaCl 125 mL/hr at 08/12/21 1454   valproate sodium 62.5 mg (08/12/21 1351)     LOS: 1 day    Time spent: 35 minutes    Irine Seal, MD [facility]   To contact the attending provider between 7A-7P or the covering provider during after hours 7P-7A, please log into the web site [email or web address] and access using universal [place] password for that  web site. If you do not have the password, please call the hospital operator.  08/12/2021, 3:40 PM

## 2021-08-12 NOTE — Progress Notes (Signed)
Attempted to start IV per MD request, Patient refusing, pulling away. IV team consulted, attempted IV twice with no success. This nurse tried assisting patient with breakfast, patient refused food. Water pitcher with straw held to patients lips, patient clenching jaw and lips together refusing to take sips.

## 2021-08-13 DIAGNOSIS — F03918 Unspecified dementia, unspecified severity, with other behavioral disturbance: Secondary | ICD-10-CM | POA: Diagnosis not present

## 2021-08-13 DIAGNOSIS — K5900 Constipation, unspecified: Secondary | ICD-10-CM | POA: Diagnosis not present

## 2021-08-13 DIAGNOSIS — N179 Acute kidney failure, unspecified: Secondary | ICD-10-CM | POA: Diagnosis not present

## 2021-08-13 DIAGNOSIS — E860 Dehydration: Secondary | ICD-10-CM | POA: Diagnosis not present

## 2021-08-13 LAB — BASIC METABOLIC PANEL
Anion gap: 5 (ref 5–15)
BUN: 28 mg/dL — ABNORMAL HIGH (ref 8–23)
CO2: 29 mmol/L (ref 22–32)
Calcium: 8.4 mg/dL — ABNORMAL LOW (ref 8.9–10.3)
Chloride: 106 mmol/L (ref 98–111)
Creatinine, Ser: 1.4 mg/dL — ABNORMAL HIGH (ref 0.61–1.24)
GFR, Estimated: 52 mL/min — ABNORMAL LOW (ref >60)
Glucose, Bld: 130 mg/dL — ABNORMAL HIGH (ref 70–99)
Potassium: 3.5 mmol/L (ref 3.5–5.1)
Sodium: 140 mmol/L (ref 135–145)

## 2021-08-13 LAB — GLUCOSE, CAPILLARY
Glucose-Capillary: 98 mg/dL (ref 70–99)
Glucose-Capillary: 92 mg/dL (ref 70–99)
Glucose-Capillary: 85 mg/dL (ref 70–99)

## 2021-08-13 MED ORDER — HALOPERIDOL LACTATE 5 MG/ML IJ SOLN
1.0000 mg | Freq: Four times a day (QID) | INTRAMUSCULAR | Status: DC | PRN
  Administered 2021-08-13 – 2021-08-15 (×4): 1 mg via INTRAMUSCULAR
  Filled 2021-08-13 (×4): qty 1

## 2021-08-13 MED ORDER — POTASSIUM CHLORIDE 10 MEQ/100ML IV SOLN
10.0000 meq | INTRAVENOUS | Status: AC
  Administered 2021-08-13 (×4): 10 meq via INTRAVENOUS
  Filled 2021-08-13: qty 100

## 2021-08-13 MED ORDER — POTASSIUM CHLORIDE CRYS ER 10 MEQ PO TBCR: 40.0000 meq | EXTENDED_RELEASE_TABLET | Freq: Once | ORAL | Status: DC

## 2021-08-13 NOTE — Progress Notes (Addendum)
PROGRESS NOTE    Dillon Schultz  HCW:[id number] DOB: 07/16/1945 DOA: 08/11/2021 PCP: [name], MD    Chief Complaint  Patient presents with   Weakness    Brief Narrative:  Patient is a unfortunate 76 year old African-American gentleman history of dementia with behavioral disturbances, hypertension, functional constipation, multiple hospitalizations over the past several weeks presenting to the ED via EMS for poor oral intake/FTT, constipation noted not of had a bowel movement in a week multiple falls at home described as passing out however patient did not lose consciousness when he fell but very disoriented.  Patient admitted with constipation, acute kidney injury, dehydration, dementia with behavioral disturbances.  Patient admitted placed on IV fluids home regimen medications continued as well as IV Haldol for agitation.  Patient noted to have been agitated, pulling out lines, refusing medications.  Patient with poor oral intake.  Patient seen by palliative care who discussed with family and decision was made for full scope of care including full code.   Assessment & Plan:   Principal Problem:   AKI (acute kidney injury) ([place]) Active Problems:   Dementia with behavioral disturbance   Hypertension   Syncope and collapse   Malnutrition of moderate degree   Falls   Constipation   Dehydration  #1 acute kidney injury -Baseline creatinine of 1.3 and presented with a creatinine of 2.50 on admission. -Likely secondary to prerenal azotemia in the setting of poor oral intake and dehydration. -Patient placed on IV fluids however receiving fluids intermittently as patient pulling out IVs. -Renal function improving with hydration. -IV fluids, supportive care.  2.  Dehydration -IV fluids.  3.  Fall/syncope and collapse/history of orthostasis. -Patient noted during recent hospitalization to have orthostatic hypotension which likely may be reason for his fall/syncope and collapse,  noted to have been bradycardic and evaluated by cardiology event monitor considered however due to patient's dementia was not advised. -Patient on midodrine for orthostasis which we will continue. -IV fluids, supportive care.  4.  Protein calorie malnutrition/ FTT -Secondary to poor oral intake from advanced dementia. -Continue Remeron at bedtime to see whether that might stimulate appetite. -Nutrition supplementation.  5.  Dementia with behavioral disturbances/failure to thrive -Patient noted with dementia with agitation, poor oral intake, dehydration, failure to thrive with multiple hospitalizations. -Patient seems to slowly be declining. -Patient refusing oral medications and as such oral Depakote changed to IV Depakote.  Continue Celexa.   -May need to place on Zyprexa 5 mg at 5 PM daily if patient will agree to oral intake. -Continue Remeron nightly. -Patient seen in consultation by palliative care who discussed with family and family wanted full scope of care including full code and PEG tube placement if needed. -Patient however likely a poor candidate for PEG tube placement due to dementia with behavioral disturbances and agitation and at this time would not recommend PEG tube placement. -Palliative care following. -We will likely need palliative care to follow in the outpatient setting.  6.  Hypertension -Patient refusing oral medications. -IV hydralazine as needed.  #7.  Constipation -Patient refusing MiraLAX on presentation noted to be refusing medications. -Patient noted to have bowel movements yesterday. -Follow. -    DVT prophylaxis: Lovenox Code Status: Full Family Communication: Updated son and wife on the telephone. Disposition:   Status is: Inpatient Remains inpatient appropriate because: Severity of illness           Consultants:  Palliative care: Kathie Rhodes, NP 08/11/2021  Procedures:  Chest x-ray 08/11/2021 Abdominal  films  08/11/2021  Antimicrobials:  None   Subjective: Patient asleep, sedated.  Noted to have had bowel movements yesterday per RN.   Objective: Vitals:   08/12/21 2112 08/13/21 0430 08/13/21 0608 08/13/21 0824  BP: 133/69 140/72 (!) 159/92 (!) 151/88  Pulse: 98 88 (!) 47 (!) 50  Resp: 20 18 16 18   Temp: 98 F (36.7 C) 98 F (36.7 C) 97.7 F (36.5 C) 98.7 F (37.1 C)  TempSrc:      SpO2: 94% 97% 99% 100%  Weight:      Height:        Intake/Output Summary (Last 24 hours) at 08/13/2021 1224 Last data filed at 08/13/2021 0718 Gross per 24 hour  Intake 2363.55 ml  Output 251 ml  Net 2112.55 ml    Filed Weights   08/11/21 0658 08/11/21 2115  Weight: 63 kg 64 kg    Examination:  General exam: Sedated.  Asleep. Respiratory system: Clear to auscultation bilaterally anterior lung fields.  No wheezes, no crackles, no rhonchi.  Normal respiratory effort.   Cardiovascular system: Regular rate rhythm no murmurs rubs or gallops.  No JVD.  No lower extremity edema.  Gastrointestinal system: Abdomen is soft, nontender, nondistended, positive bowel sounds.  No rebound.  No guarding.  Central nervous system: Sedated. No focal neurological deficits. Extremities: Symmetric 5 x 5 power. Skin: No rashes, lesions or ulcers Psychiatry: Judgement and insight unable to assess. Mood & affect appropriate.     Data Reviewed: I have personally reviewed following labs and imaging studies  CBC: Recent Labs  Lab 08/11/21 0714 08/12/21 0456  WBC 5.1 4.3  NEUTROABS 4.1  --   HGB 11.9* 11.1*  HCT 37.3* 34.3*  MCV 91.6 91.2  PLT 218 179     Basic Metabolic Panel: Recent Labs  Lab 08/11/21 0714 08/12/21 0456 08/13/21 0550  NA 138 140 140  K 4.4 3.8 3.5  CL 100 105 106  CO2 29 28 29   GLUCOSE 77 86 130*  BUN 53* 44* 28*  CREATININE 2.50* 1.79* 1.40*  CALCIUM 9.2 8.8* 8.4*     GFR: Estimated Creatinine Clearance: 40 mL/min (A) (by C-G formula based on SCr of 1.4 mg/dL  (H)).  Liver Function Tests: Recent Labs  Lab 08/11/21 0714  AST 24  ALT 12  ALKPHOS 66  BILITOT 0.7  PROT 7.1  ALBUMIN 3.2*     CBG: Recent Labs  Lab 08/12/21 1314 08/12/21 1328 08/12/21 1652 08/13/21 0611 08/13/21 1108  GLUCAP 48* 127* 113* 98 92      Recent Results (from the past 240 hour(s))  Resp Panel by RT-PCR (Flu A&B, Covid) Nasopharyngeal Swab     Status: None   Collection Time: 08/11/21  7:14 AM   Specimen: Nasopharyngeal Swab; Nasopharyngeal(NP) swabs in vial transport medium  Result Value Ref Range Status   SARS Coronavirus 2 by RT PCR NEGATIVE NEGATIVE Final    Comment: (NOTE) SARS-CoV-2 target nucleic acids are NOT DETECTED.  The SARS-CoV-2 RNA is generally detectable in upper respiratory specimens during the acute phase of infection. The lowest concentration of SARS-CoV-2 viral copies this assay can detect is 138 copies/mL. A negative result does not preclude SARS-Cov-2 infection and should not be used as the sole basis for treatment or other patient management decisions. A negative result may occur with  improper specimen collection/handling, submission of specimen other than nasopharyngeal swab, presence of viral mutation(s) within the areas targeted by this assay, and inadequate number of viral copies(<138 copies/mL).  A negative result must be combined with clinical observations, patient history, and epidemiological information. The expected result is Negative.  Fact Sheet for Patients:  EntrepreneurPulse.com.au  Fact Sheet for Healthcare Providers:  IncredibleEmployment.be  This test is no t yet approved or cleared by the [place] FDA and  has been authorized for detection and/or diagnosis of SARS-CoV-2 by FDA under an Emergency Use Authorization (EUA). This EUA will remain  in effect (meaning this test can be used) for the duration of the COVID-19 declaration under Section 564(b)(1) of the Act,  21 U.S.C.section 360bbb-3(b)(1), unless the authorization is terminated  or revoked sooner.       Influenza A by PCR NEGATIVE NEGATIVE Final   Influenza B by PCR NEGATIVE NEGATIVE Final    Comment: (NOTE) The Xpert Xpress SARS-CoV-2/FLU/RSV plus assay is intended as an aid in the diagnosis of influenza from Nasopharyngeal swab specimens and should not be used as a sole basis for treatment. Nasal washings and aspirates are unacceptable for Xpert Xpress SARS-CoV-2/FLU/RSV testing.  Fact Sheet for Patients: EntrepreneurPulse.com.au  Fact Sheet for Healthcare Providers: IncredibleEmployment.be  This test is not yet approved or cleared by the [place] FDA and has been authorized for detection and/or diagnosis of SARS-CoV-2 by FDA under an Emergency Use Authorization (EUA). This EUA will remain in effect (meaning this test can be used) for the duration of the COVID-19 declaration under Section 564(b)(1) of the Act, 21 U.S.C. section 360bbb-3(b)(1), unless the authorization is terminated or revoked.  Performed at [facility], [address], [place],  [address]           Radiology Studies: No results found.      Scheduled Meds:  (feeding supplement) PROSource Plus  30 mL Oral BID BM   amLODipine  2.5 mg Oral Daily   aspirin  81 mg Oral Daily   citalopram  40 mg Oral Daily   enoxaparin (LOVENOX) injection  40 mg Subcutaneous Q24H   feeding supplement  237 mL Oral TID BM   gabapentin  100 mg Oral TID   midodrine  5 mg Oral TID WC   mirtazapine  7.5 mg Oral QHS   multivitamin with minerals  1 tablet Oral Daily   polyethylene glycol  17 g Oral Daily   Continuous Infusions:  dextrose 5 % and 0.9% NaCl 125 mL/hr at 08/13/21 0548   potassium chloride 10 mEq (08/13/21 1104)   valproate sodium 62.5 mg (08/13/21 0739)     LOS: 2 days    Time spent: 35 minutes    Irine Seal, MD [facility]   To contact the attending provider between 7A-7P or the covering provider during after hours 7P-7A, please log into the web site [email or web address] and access using universal [place] password for that web site. If you do not have the password, please call the hospital operator.  08/13/2021, 12:24 PM

## 2021-08-13 NOTE — Progress Notes (Signed)
Pt became agitated and combative towards staff; attempting to get oob.  Unable to verbally redirect.  PRN dose of Haldol administered.  Will continue to monitor and assess and update as needed.

## 2021-08-13 NOTE — Progress Notes (Signed)
Patient resting in bed covered up, refused breakfast and medications. Encouraged patient to drink ensure at bedside, and to eat. IVF infusing. Call bell in reach, bed alarm on, bed in lowest position.

## 2021-08-13 NOTE — Progress Notes (Signed)
Patient pulled out IV, and throwing his fists being combative. IM haldol given. No IV access, refused restart of IV.

## 2021-08-14 DIAGNOSIS — F03918 Unspecified dementia, unspecified severity, with other behavioral disturbance: Secondary | ICD-10-CM | POA: Diagnosis not present

## 2021-08-14 DIAGNOSIS — K5900 Constipation, unspecified: Secondary | ICD-10-CM | POA: Diagnosis not present

## 2021-08-14 DIAGNOSIS — E860 Dehydration: Secondary | ICD-10-CM | POA: Diagnosis not present

## 2021-08-14 DIAGNOSIS — N179 Acute kidney failure, unspecified: Secondary | ICD-10-CM | POA: Diagnosis not present

## 2021-08-14 LAB — BASIC METABOLIC PANEL
Anion gap: 4 — ABNORMAL LOW (ref 5–15)
BUN: 18 mg/dL (ref 8–23)
CO2: 31 mmol/L (ref 22–32)
Calcium: 8.7 mg/dL — ABNORMAL LOW (ref 8.9–10.3)
Chloride: 107 mmol/L (ref 98–111)
Creatinine, Ser: 1.27 mg/dL — ABNORMAL HIGH (ref 0.61–1.24)
GFR, Estimated: 58 mL/min — ABNORMAL LOW (ref >60)
Glucose, Bld: 96 mg/dL (ref 70–99)
Potassium: 4.2 mmol/L (ref 3.5–5.1)
Sodium: 142 mmol/L (ref 135–145)

## 2021-08-14 LAB — GLUCOSE, CAPILLARY: Glucose-Capillary: 73 mg/dL (ref 70–99)

## 2021-08-14 MED ORDER — OLANZAPINE 5 MG PO TBDP
5.0000 mg | ORAL_TABLET | Freq: Every day | ORAL | Status: DC
Start: 2021-08-14 — End: 2021-08-17
  Administered 2021-08-14 – 2021-08-15 (×2): 5 mg via ORAL
  Filled 2021-08-14 (×4): qty 1

## 2021-08-14 MED ORDER — VALPROATE SODIUM 100 MG/ML IV SOLN
125.0000 mg | Freq: Four times a day (QID) | INTRAVENOUS | Status: DC
  Administered 2021-08-14 – 2021-08-16 (×7): 125 mg via INTRAVENOUS
  Filled 2021-08-14 (×8): qty 1.25

## 2021-08-14 MED ORDER — SENNOSIDES-DOCUSATE SODIUM 8.6-50 MG PO TABS
1.0000 | ORAL_TABLET | Freq: Two times a day (BID) | ORAL | Status: DC
  Administered 2021-08-16 – 2021-08-17 (×2): 1 via ORAL
  Filled 2021-08-14 (×4): qty 1

## 2021-08-14 NOTE — Progress Notes (Signed)
Spoke with the [facility] on the phone Their plan is to take the patient home,  He lives with his wife and son He has a rolling walker and 3 in 1 The son asked about obtaining POA, I explained that the patient would have to be alert and oriented,  He stated understanding,  He stated that he wants to keep his dad a Full code but wants to make sure he is comfortable Outpatient palliative is to follow at [place]

## 2021-08-14 NOTE — Progress Notes (Signed)
Pt. Became extremely combative when trying to obtain vital signs

## 2021-08-14 NOTE — TOC Progression Note (Signed)
Transition of Care [facility]) - Progression Note    Patient Details  Name: Dillon Schultz MRN: [id number] Date of Birth: 09-20-1945  Transition of [facility]) CM/SW [place], RN Phone Number: 08/14/2021, 12:44 PM  Clinical Narrative:   reached out to [facility] requesting to accept the patient for RN and SW, awaiting a response, Sent out to [place], [place], [place], [place], [place], [place], [place] and [place], awaiting an answer    Expected Discharge Plan: Home/Self Care Barriers to Discharge: Continued Medical Work up  Expected Discharge Plan and Services Expected Discharge Plan: Home/Self Care   Discharge Planning Services: CM Consult   Living arrangements for the past 2 months: Single Family Home                                       Social Determinants of Health (SDOH) Interventions    Readmission Risk Interventions No flowsheet data found.

## 2021-08-14 NOTE — Progress Notes (Signed)
PROGRESS NOTE    Dillon Schultz  JGG:[id number] DOB: 10-09-1945 DOA: 08/11/2021 PCP: [name], MD    Chief Complaint  Patient presents with   Weakness    Brief Narrative:  Patient is a unfortunate 76 year old African-American gentleman history of dementia with behavioral disturbances, hypertension, functional constipation, multiple hospitalizations over the past several weeks presenting to the ED via EMS for poor oral intake/FTT, constipation noted not of had a bowel movement in a week multiple falls at home described as passing out however patient did not lose consciousness when he fell but very disoriented.  Patient admitted with constipation, acute kidney injury, dehydration, dementia with behavioral disturbances.  Patient admitted placed on IV fluids home regimen medications continued as well as IV Haldol for agitation.  Patient noted to have been agitated, pulling out lines, refusing medications.  Patient with poor oral intake.  Patient seen by palliative care who discussed with family and decision was made for full scope of care including full code.   Assessment & Plan:   Principal Problem:   AKI (acute kidney injury) ([place]) Active Problems:   Dementia with behavioral disturbance   Hypertension   Syncope and collapse   Malnutrition of moderate degree   Falls   Constipation   Dehydration  #1 acute kidney injury -Baseline creatinine of 1.3 and presented with a creatinine of 2.50 on admission. -Likely secondary to prerenal azotemia in the setting of poor oral intake and dehydration. -Patient placed on IV fluids however receiving fluids intermittently as patient pulling out IVs. -Renal function improving with hydration. -IV fluids, supportive care.  2.  Dehydration -IV fluids.  3.  Fall/syncope and collapse/history of orthostasis. -Patient noted during recent hospitalization to have orthostatic hypotension which likely may be reason for his fall/syncope and collapse,  noted to have been bradycardic and evaluated by cardiology event monitor considered however due to patient's dementia was not advised. -Patient on midodrine for orthostasis which we will continue. -IV fluids, supportive care.  4.  Protein calorie malnutrition/ FTT -Secondary to poor oral intake from advanced dementia. -Continue Remeron at bedtime to see whether that might stimulate appetite. -Nutrition supplementation.  5.  Dementia with behavioral disturbances/failure to thrive -Patient noted with dementia with agitation, poor oral intake, dehydration, failure to thrive with multiple hospitalizations. -Patient seems to slowly be declining and likely natural progression of dementia. -Patient refusing oral medications and as such oral Depakote changed to IV Depakote.  Continue Celexa.   -Increase Depakote dose to 250 mg twice daily to see if this may help with his agitation.   -Start Zyprexa 5 mg daily at 6 PM to also help with agitation.  -Continue Remeron nightly. -Patient seen in consultation by palliative care who discussed with family and family wanted full scope of care including full code and PEG tube placement if needed. -Patient however likely a poor candidate for PEG tube placement due to dementia with behavioral disturbances and agitation and at this time would not recommend PEG tube placement. -Palliative care following. -We will likely need palliative care to follow in the outpatient setting.  6.  Hypertension -Patient refusing oral medications over the past few days. -Patient noted to have taking his Norvasc this morning. -IV hydralazine as needed.  #7.  Constipation -Patient refusing MiraLAX on presentation noted to be refusing medications. -Patient noted to have bowel movements with improvement with constipation.. -Continue bowel regimen of MiraLAX daily. -Add Senokot S twice daily. -Follow.    DVT prophylaxis: Lovenox Code Status: Full  Family Communication: No  family at bedside. Disposition:   Status is: Inpatient Remains inpatient appropriate because: Severity of illness           Consultants:  Palliative care: Kathie Rhodes, NP 08/11/2021  Procedures:  Chest x-ray 08/11/2021 Abdominal films 08/11/2021  Antimicrobials:  None   Subjective: Patient sitting up in bed sleeping but easily arousable.  Denies any chest pain.  No shortness of breath.  No abdominal pain.  Noted in mittens.  Still with poor oral intake as noted that patient ate less than 25% of his breakfast tray this morning.  Patient still with agitation today and in mittens.  Objective: Vitals:   08/13/21 1602 08/13/21 2151 08/14/21 0940 08/14/21 1134  BP: 134/81 (!) 153/76 (!) 173/98 137/79  Pulse: (!) 55 (!) 51 75 85  Resp: 18 16 16 16   Temp: 98.2 F (36.8 C) 98.2 F (36.8 C) 98 F (36.7 C) (!) 97.3 F (36.3 C)  TempSrc:      SpO2: 100% 100% 99% 100%  Weight:      Height:        Intake/Output Summary (Last 24 hours) at 08/14/2021 1506 Last data filed at 08/13/2021 1524 Gross per 24 hour  Intake 794.33 ml  Output --  Net 794.33 ml    Filed Weights   08/11/21 0658 08/11/21 2115  Weight: 63 kg 64 kg    Examination:  General exam: NAD.  In mittens. Respiratory system: Clear to auscultation bilaterally anterior lung fields.  No wheezes, no crackles, no rhonchi.  Normal respiratory effort.   Cardiovascular system: Regular rate rhythm no murmurs rubs or gallops.  No JVD.  No lower extremity edema.  Gastrointestinal system: Abdomen is soft, nontender, nondistended, positive bowel sounds.  No rebound.  No guarding.  Central nervous system: Sedated. No focal neurological deficits. Extremities: Symmetric 5 x 5 power. Skin: No rashes, lesions or ulcers Psychiatry: Judgement and insight unable to assess. Mood & affect appropriate.     Data Reviewed: I have personally reviewed following labs and imaging studies  CBC: Recent Labs  Lab 08/11/21 0714  08/12/21 0456  WBC 5.1 4.3  NEUTROABS 4.1  --   HGB 11.9* 11.1*  HCT 37.3* 34.3*  MCV 91.6 91.2  PLT 218 179     Basic Metabolic Panel: Recent Labs  Lab 08/11/21 0714 08/12/21 0456 08/13/21 0550 08/14/21 0625  NA 138 140 140 142  K 4.4 3.8 3.5 4.2  CL 100 105 106 107  CO2 29 28 29 31   GLUCOSE 77 86 130* 96  BUN 53* 44* 28* 18  CREATININE 2.50* 1.79* 1.40* 1.27*  CALCIUM 9.2 8.8* 8.4* 8.7*     GFR: Estimated Creatinine Clearance: 44.1 mL/min (A) (by C-G formula based on SCr of 1.27 mg/dL (H)).  Liver Function Tests: Recent Labs  Lab 08/11/21 0714  AST 24  ALT 12  ALKPHOS 66  BILITOT 0.7  PROT 7.1  ALBUMIN 3.2*     CBG: Recent Labs  Lab 08/12/21 1652 08/13/21 0611 08/13/21 1108 08/13/21 1608 08/14/21 0817  GLUCAP 113* 98 92 85 73      Recent Results (from the past 240 hour(s))  Resp Panel by RT-PCR (Flu A&B, Covid) Nasopharyngeal Swab     Status: None   Collection Time: 08/11/21  7:14 AM   Specimen: Nasopharyngeal Swab; Nasopharyngeal(NP) swabs in vial transport medium  Result Value Ref Range Status   SARS Coronavirus 2 by RT PCR NEGATIVE NEGATIVE Final    Comment: (NOTE)  SARS-CoV-2 target nucleic acids are NOT DETECTED.  The SARS-CoV-2 RNA is generally detectable in upper respiratory specimens during the acute phase of infection. The lowest concentration of SARS-CoV-2 viral copies this assay can detect is 138 copies/mL. A negative result does not preclude SARS-Cov-2 infection and should not be used as the sole basis for treatment or other patient management decisions. A negative result may occur with  improper specimen collection/handling, submission of specimen other than nasopharyngeal swab, presence of viral mutation(s) within the areas targeted by this assay, and inadequate number of viral copies(<138 copies/mL). A negative result must be combined with clinical observations, patient history, and epidemiological information. The expected  result is Negative.  Fact Sheet for Patients:  EntrepreneurPulse.com.au  Fact Sheet for Healthcare Providers:  IncredibleEmployment.be  This test is no t yet approved or cleared by the [place] FDA and  has been authorized for detection and/or diagnosis of SARS-CoV-2 by FDA under an Emergency Use Authorization (EUA). This EUA will remain  in effect (meaning this test can be used) for the duration of the COVID-19 declaration under Section 564(b)(1) of the Act, 21 U.S.C.section 360bbb-3(b)(1), unless the authorization is terminated  or revoked sooner.       Influenza A by PCR NEGATIVE NEGATIVE Final   Influenza B by PCR NEGATIVE NEGATIVE Final    Comment: (NOTE) The Xpert Xpress SARS-CoV-2/FLU/RSV plus assay is intended as an aid in the diagnosis of influenza from Nasopharyngeal swab specimens and should not be used as a sole basis for treatment. Nasal washings and aspirates are unacceptable for Xpert Xpress SARS-CoV-2/FLU/RSV testing.  Fact Sheet for Patients: EntrepreneurPulse.com.au  Fact Sheet for Healthcare Providers: IncredibleEmployment.be  This test is not yet approved or cleared by the [place] FDA and has been authorized for detection and/or diagnosis of SARS-CoV-2 by FDA under an Emergency Use Authorization (EUA). This EUA will remain in effect (meaning this test can be used) for the duration of the COVID-19 declaration under Section 564(b)(1) of the Act, 21 U.S.C. section 360bbb-3(b)(1), unless the authorization is terminated or revoked.  Performed at [facility], [address] [place], [place] [address]           Radiology Studies: No results found.      Scheduled Meds:  (feeding supplement) PROSource Plus  30 mL Oral BID BM   amLODipine  2.5 mg Oral Daily   aspirin  81 mg Oral Daily   citalopram  40 mg Oral Daily   enoxaparin (LOVENOX) injection  40  mg Subcutaneous Q24H   feeding supplement  237 mL Oral TID BM   gabapentin  100 mg Oral TID   midodrine  5 mg Oral TID WC   mirtazapine  7.5 mg Oral QHS   multivitamin with minerals  1 tablet Oral Daily   OLANZapine zydis  5 mg Oral q1600   polyethylene glycol  17 g Oral Daily   Continuous Infusions:  dextrose 5 % and 0.9% NaCl 100 mL/hr at 08/13/21 2332   valproate sodium       LOS: 3 days    Time spent: 35 minutes    Irine Seal, MD [facility]   To contact the attending provider between 7A-7P or the covering provider during after hours 7P-7A, please log into the web site [email or web address] and access using universal [place] password for that web site. If you do not have the password, please call the hospital operator.  08/14/2021, 3:06 PM

## 2021-08-14 NOTE — Plan of Care (Signed)

## 2021-08-15 DIAGNOSIS — K5900 Constipation, unspecified: Secondary | ICD-10-CM | POA: Diagnosis not present

## 2021-08-15 DIAGNOSIS — N179 Acute kidney failure, unspecified: Secondary | ICD-10-CM | POA: Diagnosis not present

## 2021-08-15 DIAGNOSIS — Z7189 Other specified counseling: Secondary | ICD-10-CM | POA: Diagnosis not present

## 2021-08-15 DIAGNOSIS — Z515 Encounter for palliative care: Secondary | ICD-10-CM | POA: Diagnosis not present

## 2021-08-15 DIAGNOSIS — E860 Dehydration: Secondary | ICD-10-CM | POA: Diagnosis not present

## 2021-08-15 DIAGNOSIS — F03918 Unspecified dementia, unspecified severity, with other behavioral disturbance: Secondary | ICD-10-CM | POA: Diagnosis not present

## 2021-08-15 LAB — GLUCOSE, CAPILLARY
Glucose-Capillary: 71 mg/dL (ref 70–99)
Glucose-Capillary: 141 mg/dL — ABNORMAL HIGH (ref 70–99)
Glucose-Capillary: 67 mg/dL — ABNORMAL LOW (ref 70–99)
Glucose-Capillary: 89 mg/dL (ref 70–99)

## 2021-08-15 LAB — BASIC METABOLIC PANEL WITH GFR
Anion gap: 7 (ref 5–15)
BUN: 14 mg/dL (ref 8–23)
CO2: 27 mmol/L (ref 22–32)
Calcium: 8.3 mg/dL — ABNORMAL LOW (ref 8.9–10.3)
Chloride: 108 mmol/L (ref 98–111)
Creatinine, Ser: 1.08 mg/dL (ref 0.61–1.24)
GFR, Estimated: >60 mL/min
Glucose, Bld: 108 mg/dL — ABNORMAL HIGH (ref 70–99)
Potassium: 3.8 mmol/L (ref 3.5–5.1)
Sodium: 142 mmol/L (ref 135–145)

## 2021-08-15 LAB — MAGNESIUM: Magnesium: 1.9 mg/dL (ref 1.7–2.4)

## 2021-08-15 MED ORDER — HYDRALAZINE HCL 20 MG/ML IJ SOLN
5.0000 mg | Freq: Once | INTRAMUSCULAR | Status: AC
  Administered 2021-08-15: 5 mg via INTRAVENOUS
  Filled 2021-08-15: qty 1

## 2021-08-15 MED ORDER — DEXTROSE-NACL 5-0.9 % IV SOLN: INTRAVENOUS | Status: DC

## 2021-08-15 MED ORDER — AMLODIPINE BESYLATE 5 MG PO TABS
5.0000 mg | ORAL_TABLET | Freq: Every day | ORAL | Status: DC
  Administered 2021-08-15 – 2021-08-17 (×3): 5 mg via ORAL
  Filled 2021-08-15 (×3): qty 1

## 2021-08-15 MED ORDER — HYDRALAZINE HCL 25 MG PO TABS
25.0000 mg | ORAL_TABLET | Freq: Two times a day (BID) | ORAL | Status: DC
  Administered 2021-08-16 – 2021-08-17 (×3): 25 mg via ORAL
  Filled 2021-08-15 (×4): qty 1

## 2021-08-15 MED ORDER — DIVALPROEX SODIUM 125 MG PO CSDR: 250.0000 mg | DELAYED_RELEASE_CAPSULE | Freq: Two times a day (BID) | ORAL | Status: DC

## 2021-08-15 MED ORDER — DIVALPROEX SODIUM 125 MG PO CSDR
250.0000 mg | DELAYED_RELEASE_CAPSULE | Freq: Two times a day (BID) | ORAL | Status: DC
Start: 2021-08-16 — End: 2021-08-17
  Administered 2021-08-16 – 2021-08-17 (×2): 250 mg via ORAL
  Filled 2021-08-15 (×3): qty 2

## 2021-08-15 MED ORDER — DIVALPROEX SODIUM 125 MG PO CSDR
125.0000 mg | DELAYED_RELEASE_CAPSULE | Freq: Once | ORAL | Status: DC
  Filled 2021-08-15: qty 1

## 2021-08-15 MED ORDER — HALOPERIDOL LACTATE 5 MG/ML IJ SOLN
0.5000 mg | Freq: Four times a day (QID) | INTRAMUSCULAR | Status: DC | PRN
  Administered 2021-08-15 – 2021-08-16 (×2): 0.5 mg via INTRAMUSCULAR
  Filled 2021-08-15 (×2): qty 1

## 2021-08-15 NOTE — Progress Notes (Addendum)
bp 183/91 asymptomatic sent message to NP; orders placed in epic for iv 5mg  hydralazine.

## 2021-08-15 NOTE — Progress Notes (Signed)
Palliative: Dillon Schultz is lying quietly in bed.  He greets me making and somewhat keeping eye contact.  He appears acutely/chronically ill and quite frail and thin.  He is alert, oriented to self.  I do not ask other orientation questions as he has known dementia.  I am not sure that he can make his basic needs known.  There is no family at bedside at this time.  Call to son, Dillon Schultz.  We talked about Dillon Schultz acute and chronic health concerns.  I share my visit with Dillon Schultz this morning, he was calm, able to feed himself some peaches.  We talked about disposition.  Dillon Schultz is active with [facility] outpatient palliative services.  This service is to continue.  Transition care team is working for home health services.  Conference with attending, transition of care team related to patient condition, needs, goals of care, disposition.  Plan:   Return home with out patient palliative already in place through [facility].  Home health services if provider found.  72 minutes  Dillon Axe, NP Palliative medicine team Team phone [phone] Greater than 50% of this time was spent counseling and coordinating care related to the above assessment and plan.

## 2021-08-15 NOTE — Care Management Important Message (Signed)
Important Message  Patient Details  Name: Hardie Veltre MRN: [id number] Date of Birth: 01/23/1946   Medicare Important Message Given:  Yes     Juliann Pulse A [place] 08/15/2021, 2:11 PM

## 2021-08-15 NOTE — Plan of Care (Signed)

## 2021-08-15 NOTE — TOC Progression Note (Addendum)
Transition of Care [facility]) - Progression Note    Patient Details  Name: Dillon Schultz MRN: [id number] Date of Birth: 1946-05-30  Transition of Care [facility]) CM/SW [place], RN Phone Number: 08/15/2021, 3:15 PM  Clinical Narrative:   Spoke with [name] the son, he said he wants to DC his dad today and wants EMS to transport, I explained that going home today may not be the best medical option, I explained it could be pretty late If the doctor does agree to DC and  if EMS transports and he said he understood, I explained that Ins may or may not approve and cover EMS transport and he stated understanding, He said someone would be there regardless of the time, I told him that I would tell the Doctor what he said and let the Dr decide on DC, he agreed  I spoke to the son again and explained that the Dr does not feel that he is Medically ready to DC yet, He agreed and stated that we should keep him unil he is medically ready   Expected Discharge Plan: Home/Self Care Barriers to Discharge: Continued Medical Work up  Expected Discharge Plan and Services Expected Discharge Plan: Home/Self Care   Discharge Planning Services: CM Consult   Living arrangements for the past 2 months: Single Family Home                                       Social Determinants of Health (SDOH) Interventions    Readmission Risk Interventions No flowsheet data found.

## 2021-08-15 NOTE — Progress Notes (Signed)
PROGRESS NOTE    Dillon Schultz  QPY:[id number] DOB: 03-21-1946 DOA: 08/11/2021 PCP: [name], MD    Chief Complaint  Patient presents with   Weakness    Brief Narrative:  Patient is a unfortunate 75 year old African-American gentleman history of dementia with behavioral disturbances, hypertension, functional constipation, multiple hospitalizations over the past several weeks presenting to the ED via EMS for poor oral intake/FTT, constipation noted not of had a bowel movement in a week multiple falls at home described as passing out however patient did not lose consciousness when he fell but very disoriented.  Patient admitted with constipation, acute kidney injury, dehydration, dementia with behavioral disturbances.  Patient admitted placed on IV fluids home regimen medications continued as well as IV Haldol for agitation.  Patient noted to have been agitated, pulling out lines, refusing medications.  Patient with poor oral intake.  Patient seen by palliative care who discussed with family and decision was made for full scope of care including full code.   Assessment & Plan:   Principal Problem:   AKI (acute kidney injury) ([place]) Active Problems:   Dementia with behavioral disturbance   Hypertension   Syncope and collapse   Malnutrition of moderate degree   Falls   Constipation   Dehydration  #1 acute kidney injury -Baseline creatinine of 1.3 and presented with a creatinine of 2.50 on admission. -Likely secondary to prerenal azotemia in the setting of poor oral intake and dehydration. -Patient placed on IV fluids however receiving fluids intermittently as patient pulling out IVs. -Renal function improving with hydration. -IV fluids, supportive care.  2.  Dehydration -IV fluids for another 24 hours and discontinue.  3.  Fall/syncope and collapse/history of orthostasis. -Patient noted during recent hospitalization to have orthostatic hypotension which likely may be reason  for his fall/syncope and collapse, noted to have been bradycardic and evaluated by cardiology event monitor considered however due to patient's dementia was not advised. -Patient on midodrine for orthostasis which we will continue. -IV fluids, supportive care.  4.  Protein calorie malnutrition/ FTT -Secondary to poor oral intake from advanced dementia. -Continue Remeron at bedtime to see whether that might help stimulate appetite. -Nutrition supplementation.  5.  Dementia with behavioral disturbances/failure to thrive -Patient noted with dementia with agitation, poor oral intake, dehydration, failure to thrive with multiple hospitalizations. -Patient seems to slowly be declining and likely natural progression of dementia. -Patient refusing oral medications and as such oral Depakote changed to IV Depakote.  Continue Celexa.   -Increased Depakote dose to 250 mg twice daily to see if this may help with his agitation.   -Started Zyprexa 5 mg daily at 6 PM to also help with agitation.  -Continue Remeron nightly. -Patient seen in consultation by palliative care who discussed with family and family wanted full scope of care including full code and PEG tube placement if needed. -Patient however likely a poor candidate for PEG tube placement due to dementia with behavioral disturbances and agitation and at this time would not recommend PEG tube placement. -Palliative care following. -Once agitation has improved, and taking oral medications we will transition from IV Depakote to oral Depakote 250 mg p.o. twice daily starting tomorrow 08/16/2021. -Will likely need palliative care to follow in the outpatient setting.  6.  Hypertension -Patient refusing oral medications over the past few days. -Patient noted to be too sedated this morning to get any medications. -Continue Norvasc, hydralazine when more alert and awake enough to take oral intake.  #7.  Constipation -Patient refused MiraLAX on  presentation noted to be refusing medications. -Patient noted to have bowel movements with improvement with constipation.. -Continue bowel regimen of MiraLAX daily and Senokot-S twice daily.    DVT prophylaxis: Lovenox Code Status: Full Family Communication: No family at bedside. Disposition: Hopefully home with home health when clinically improved, improvement with agitation, tolerating oral intake and compliant with oral medications.  Status is: Inpatient Remains inpatient appropriate because: Severity of illness           Consultants:  Palliative care: Kathie Rhodes, NP 08/11/2021  Procedures:  Chest x-ray 08/11/2021 Abdominal films 08/11/2021  Antimicrobials:  None   Subjective: Patient sedated.  Noted to be agitated overnight.  Still with poor oral intake.  Patient noted to be too sedated to get his medications this morning.  Objective: Vitals:   08/14/21 1649 08/14/21 2129 08/15/21 0444 08/15/21 0740  BP: 124/82 (!) 150/96 (!) 185/92 (!) 168/74  Pulse: (!) 53 (!) 50 (!) 52 99  Resp:  16 17 16   Temp:  (!) 97.5 F (36.4 C) 97.7 F (36.5 C) 98.1 F (36.7 C)  TempSrc:      SpO2:  100%  100%  Weight:      Height:        Intake/Output Summary (Last 24 hours) at 08/15/2021 1121 Last data filed at 08/15/2021 0300 Gross per 24 hour  Intake 153.75 ml  Output 200 ml  Net -46.25 ml    Filed Weights   08/11/21 0658 08/11/21 2115  Weight: 63 kg 64 kg    Examination:  General exam: Sedated. Respiratory system: CTA B anterior lung fields.  No wheezes, no crackles, no rhonchi.  Normal respiratory effort.   Cardiovascular system: RRR no murmurs rubs or gallops.  No JVD.  No lower extremity edema.  Gastrointestinal system: Abdomen is soft, nontender, nondistended, positive bowel sounds.  No rebound.  No guarding.   Central nervous system: Sedated. No focal neurological deficits. Extremities: Symmetric 5 x 5 power. Skin: No rashes, lesions or ulcers Psychiatry:  Judgement and insight unable to assess. Mood & affect appropriate.     Data Reviewed: I have personally reviewed following labs and imaging studies  CBC: Recent Labs  Lab 08/11/21 0714 08/12/21 0456  WBC 5.1 4.3  NEUTROABS 4.1  --   HGB 11.9* 11.1*  HCT 37.3* 34.3*  MCV 91.6 91.2  PLT 218 179     Basic Metabolic Panel: Recent Labs  Lab 08/11/21 0714 08/12/21 0456 08/13/21 0550 08/14/21 0625 08/15/21 0330  NA 138 140 140 142 142  K 4.4 3.8 3.5 4.2 3.8  CL 100 105 106 107 108  CO2 29 28 29 31 27   GLUCOSE 77 86 130* 96 108*  BUN 53* 44* 28* 18 14  CREATININE 2.50* 1.79* 1.40* 1.27* 1.08  CALCIUM 9.2 8.8* 8.4* 8.7* 8.3*  MG  --   --   --   --  1.9     GFR: Estimated Creatinine Clearance: 51.9 mL/min (by C-G formula based on SCr of 1.08 mg/dL).  Liver Function Tests: Recent Labs  Lab 08/11/21 0714  AST 24  ALT 12  ALKPHOS 66  BILITOT 0.7  PROT 7.1  ALBUMIN 3.2*     CBG: Recent Labs  Lab 08/13/21 0611 08/13/21 1108 08/13/21 1608 08/14/21 0817 08/15/21 0911  GLUCAP 98 92 85 73 71      Recent Results (from the past 240 hour(s))  Resp Panel by RT-PCR (Flu A&B, Covid) Nasopharyngeal Swab  Status: None   Collection Time: 08/11/21  7:14 AM   Specimen: Nasopharyngeal Swab; Nasopharyngeal(NP) swabs in vial transport medium  Result Value Ref Range Status   SARS Coronavirus 2 by RT PCR NEGATIVE NEGATIVE Final    Comment: (NOTE) SARS-CoV-2 target nucleic acids are NOT DETECTED.  The SARS-CoV-2 RNA is generally detectable in upper respiratory specimens during the acute phase of infection. The lowest concentration of SARS-CoV-2 viral copies this assay can detect is 138 copies/mL. A negative result does not preclude SARS-Cov-2 infection and should not be used as the sole basis for treatment or other patient management decisions. A negative result may occur with  improper specimen collection/handling, submission of specimen other than nasopharyngeal  swab, presence of viral mutation(s) within the areas targeted by this assay, and inadequate number of viral copies(<138 copies/mL). A negative result must be combined with clinical observations, patient history, and epidemiological information. The expected result is Negative.  Fact Sheet for Patients:  EntrepreneurPulse.com.au  Fact Sheet for Healthcare Providers:  IncredibleEmployment.be  This test is no t yet approved or cleared by the [place] FDA and  has been authorized for detection and/or diagnosis of SARS-CoV-2 by FDA under an Emergency Use Authorization (EUA). This EUA will remain  in effect (meaning this test can be used) for the duration of the COVID-19 declaration under Section 564(b)(1) of the Act, 21 U.S.C.section 360bbb-3(b)(1), unless the authorization is terminated  or revoked sooner.       Influenza A by PCR NEGATIVE NEGATIVE Final   Influenza B by PCR NEGATIVE NEGATIVE Final    Comment: (NOTE) The Xpert Xpress SARS-CoV-2/FLU/RSV plus assay is intended as an aid in the diagnosis of influenza from Nasopharyngeal swab specimens and should not be used as a sole basis for treatment. Nasal washings and aspirates are unacceptable for Xpert Xpress SARS-CoV-2/FLU/RSV testing.  Fact Sheet for Patients: EntrepreneurPulse.com.au  Fact Sheet for Healthcare Providers: IncredibleEmployment.be  This test is not yet approved or cleared by the [place] FDA and has been authorized for detection and/or diagnosis of SARS-CoV-2 by FDA under an Emergency Use Authorization (EUA). This EUA will remain in effect (meaning this test can be used) for the duration of the COVID-19 declaration under Section 564(b)(1) of the Act, 21 U.S.C. section 360bbb-3(b)(1), unless the authorization is terminated or revoked.  Performed at [facility], [address], [place], [place] [address]            Radiology Studies: No results found.      Scheduled Meds:  (feeding supplement) PROSource Plus  30 mL Oral BID BM   amLODipine  5 mg Oral Daily   aspirin  81 mg Oral Daily   citalopram  40 mg Oral Daily   enoxaparin (LOVENOX) injection  40 mg Subcutaneous Q24H   feeding supplement  237 mL Oral TID BM   gabapentin  100 mg Oral TID   hydrALAZINE  25 mg Oral BID   midodrine  5 mg Oral TID WC   mirtazapine  7.5 mg Oral QHS   multivitamin with minerals  1 tablet Oral Daily   OLANZapine zydis  5 mg Oral q1600   polyethylene glycol  17 g Oral Daily   senna-docusate  1 tablet Oral BID   Continuous Infusions:  dextrose 5 % and 0.9% NaCl     valproate sodium 125 mg (08/15/21 0620)     LOS: 4 days    Time spent: 35 minutes    Irine Seal, MD [facility]  To contact the attending provider between 7A-7P or the covering provider during after hours 7P-7A, please log into the web site [email or web address] and access using universal [place] password for that web site. If you do not have the password, please call the hospital operator.  08/15/2021, 11:21 AM

## 2021-08-16 DIAGNOSIS — I10 Essential (primary) hypertension: Secondary | ICD-10-CM | POA: Diagnosis not present

## 2021-08-16 DIAGNOSIS — F03918 Unspecified dementia, unspecified severity, with other behavioral disturbance: Secondary | ICD-10-CM | POA: Diagnosis not present

## 2021-08-16 DIAGNOSIS — N179 Acute kidney failure, unspecified: Secondary | ICD-10-CM | POA: Diagnosis not present

## 2021-08-16 LAB — BASIC METABOLIC PANEL
Anion gap: 7 (ref 5–15)
BUN: 13 mg/dL (ref 8–23)
CO2: 28 mmol/L (ref 22–32)
Calcium: 8.3 mg/dL — ABNORMAL LOW (ref 8.9–10.3)
Chloride: 106 mmol/L (ref 98–111)
Creatinine, Ser: 1.17 mg/dL (ref 0.61–1.24)
GFR, Estimated: >60 mL/min (ref >60)
Glucose, Bld: 87 mg/dL (ref 70–99)
Potassium: 4 mmol/L (ref 3.5–5.1)
Sodium: 141 mmol/L (ref 135–145)

## 2021-08-16 LAB — GLUCOSE, CAPILLARY
Glucose-Capillary: 76 mg/dL (ref 70–99)
Glucose-Capillary: 47 mg/dL — ABNORMAL LOW (ref 70–99)
Glucose-Capillary: 64 mg/dL — ABNORMAL LOW (ref 70–99)
Glucose-Capillary: 142 mg/dL — ABNORMAL HIGH (ref 70–99)
Glucose-Capillary: 74 mg/dL (ref 70–99)

## 2021-08-16 LAB — CORTISOL-AM, BLOOD: Cortisol - AM: 6 ug/dL — ABNORMAL LOW (ref 6.7–22.6)

## 2021-08-16 MED ORDER — MIRTAZAPINE 15 MG PO TBDP: 7.5000 mg | ORAL_TABLET | Freq: Every day | ORAL | 0 refills | Status: AC

## 2021-08-16 MED ORDER — SENNOSIDES-DOCUSATE SODIUM 8.6-50 MG PO TABS: 1.0000 | ORAL_TABLET | Freq: Two times a day (BID) | ORAL | 0 refills | Status: AC

## 2021-08-16 MED ORDER — POLYETHYLENE GLYCOL 3350 17 G PO PACK: 17.0000 g | PACK | Freq: Every day | ORAL | 0 refills | Status: AC

## 2021-08-16 MED ORDER — PREDNISONE 2.5 MG PO TABS
2.5000 mg | ORAL_TABLET | Freq: Two times a day (BID) | ORAL | Status: DC
  Administered 2021-08-17: 2.5 mg via ORAL
  Filled 2021-08-16 (×2): qty 1

## 2021-08-16 MED ORDER — DEXTROSE 50 % IV SOLN
INTRAVENOUS | Status: AC
  Administered 2021-08-16: 50 mL
  Filled 2021-08-16: qty 50

## 2021-08-16 NOTE — Discharge Summary (Signed)
Physician Discharge Summary   Patient: Dillon Schultz MRN: [id number] DOB: April 02, 1946  Admit date:     08/11/2021  Discharge date: 08/16/21  Discharge Physician: Sharen Hones   PCP: [name], MD   Recommendations at discharge:   Follow-up with PCP in 1 week Continue follow-up with palliative care.  Discharge Diagnoses: Principal Problem:   AKI (acute kidney injury) ([place]) Active Problems:   Dementia with behavioral disturbance   Hypertension   Syncope and collapse   Malnutrition of moderate degree   Falls   Constipation   Dehydration  Resolved Problems:   * No resolved hospital problems. *   Hospital Course: Patient is a unfortunate 76 year old African-American gentleman history of dementia with behavioral disturbances, hypertension, functional constipation, multiple hospitalizations over the past several weeks presenting to the ED via EMS for poor oral intake/FTT, constipation noted not of had a bowel movement in a week multiple falls at home described as passing out however patient did not lose consciousness when he fell but very disoriented.  Patient admitted with constipation, acute kidney injury, dehydration, dementia with behavioral disturbances.  Patient admitted placed on IV fluids home regimen medications continued as well as IV Haldol for agitation.  Patient noted to have been agitated, pulling out lines, refusing medications.  Patient with poor oral intake.  Patient seen by palliative care who discussed with family and decision was made for full scope of care including full code.  Assessment and Plan:  Acute kidney injury secondary to dehydration. Syncope and collapse secondary to orthostatic hypotension secondary to dehydration. Renal function has normalized after giving fluids. Patient no longer is dehydrated.  Moderate protein calorie malnutrition. Failure to thrive. Anorexia. Hypoglycemia secondary to poor p.o. intake. Patient appears to be malnourished,  with moderate muscle atrophy. He has received fluids, he was also started on Remeron. Spoke with the nurse, patient appetite has been improving.  Dementia with behavioral disturbance. Patient is treated with Depakote.  Also received on Zyprexa.  Essential hypertension. Resume home medicines  Constipation. Prescribed Senokot as well as MiraLAX.    Consultants: None Procedures performed: none  Disposition: Home Diet recommendation:  Discharge Diet Orders (From admission, onward)     Start     Ordered   08/16/21 0000  Diet - low sodium heart healthy        08/16/21 1024           Cardiac diet  DISCHARGE MEDICATION: Allergies as of 08/16/2021       Reactions   Metformin Shortness Of Breath   Made him feel "crazy"    Glucophage [metformin Hcl]    Made him feel "crazy"         Medication List     TAKE these medications    amLODipine 10 MG tablet Commonly known as: NORVASC Take 10 mg by mouth daily.   aspirin 81 MG chewable tablet Chew 81 mg by mouth daily.   citalopram 40 MG tablet Commonly known as: CELEXA Take 40 mg by mouth daily.   divalproex 125 MG DR tablet Commonly known as: DEPAKOTE Take 1 tablet (125 mg total) by mouth every 12 (twelve) hours.   feeding supplement Liqd Take 237 mLs by mouth 3 (three) times daily between meals.   (feeding supplement) PROSource Plus liquid Take 30 mLs by mouth 2 (two) times daily between meals.   gabapentin 100 MG capsule Commonly known as: NEURONTIN Take 100 mg by mouth 3 (three) times daily.   hydrALAZINE 25 MG tablet Commonly  known as: APRESOLINE Take 1 tablet (25 mg total) by mouth 2 (two) times daily.   midodrine 5 MG tablet Commonly known as: PROAMATINE Take 1 tablet (5 mg total) by mouth 3 (three) times daily with meals.   mirtazapine 15 MG disintegrating tablet Commonly known as: REMERON SOL-TAB Take 0.5 tablets (7.5 mg total) by mouth at bedtime.   multivitamin with minerals Tabs  tablet Take 1 tablet by mouth daily.   polyethylene glycol 17 g packet Commonly known as: MIRALAX / GLYCOLAX Take 17 g by mouth daily. Start taking on: August 17, 2021   senna-docusate 8.6-50 MG tablet Commonly known as: Senokot-S Take 1 tablet by mouth 2 (two) times daily.               Discharge Care Instructions  (From admission, onward)           Start     Ordered   08/16/21 0000  Discharge wound care:       Comments: none   08/16/21 1024            Follow-up Information     Aycock, Ngwe A, MD Follow up in 1 week(s).   Specialty: Family Medicine Contact information: [place] [address] [phone]                 Discharge Exam: Filed Weights   08/11/21 0658 08/11/21 2115  Weight: 63 kg 64 kg   General exam: Appears calm and comfortable  Respiratory system: Clear to auscultation. Respiratory effort normal. Cardiovascular system: S1 & S2 heard, RRR. No JVD, murmurs, rubs, gallops or clicks. No pedal edema. Gastrointestinal system: Abdomen is nondistended, soft and nontender. No organomegaly or masses felt. Normal bowel sounds heard. Central nervous system: Alert and oriented x2. No focal neurological deficits. Extremities: Symmetric 5 x 5 power. Skin: No rashes, lesions or ulcers Psychiatry: Judgement and insight appear normal. Mood & affect appropriate.    Condition at discharge: good  The results of significant diagnostics from this hospitalization (including imaging, microbiology, ancillary and laboratory) are listed below for reference.   Imaging Studies: CT Abdomen Pelvis Wo Contrast  Result Date: 07/21/2021 CLINICAL DATA:  Nausea, vomiting, diarrhea for EXAM: CT ABDOMEN AND PELVIS WITHOUT CONTRAST TECHNIQUE: Multidetector CT imaging of the abdomen and pelvis was performed following the standard protocol without IV contrast. RADIATION DOSE REDUCTION: This exam was performed according to the departmental  dose-optimization program which includes automated exposure control, adjustment of the mA and/or kV according to patient size and/or use of iterative reconstruction technique. COMPARISON:  09/05/2009 FINDINGS: Lower chest: Heart no acute abnormality Hepatobiliary: No focal hepatic abnormality. Gallbladder unremarkable. Pancreas: No focal abnormality or ductal dilatation. Spleen: No focal abnormality.  Normal size. Adrenals/Urinary Tract: No adrenal abnormality. No focal renal abnormality. No stones or hydronephrosis. Urinary bladder is unremarkable. Stomach/Bowel: Stomach, large and small bowel grossly unremarkable. Vascular/Lymphatic: Aortic atherosclerosis. No evidence of aneurysm or adenopathy. Reproductive: No visible focal abnormality. Other: No free fluid or free air. Musculoskeletal: No acute bony abnormality. IMPRESSION: No acute findings in the abdomen or pelvis. Aortic atherosclerosis. Electronically Signed   By: Rolm Baptise M.D.   On: 07/21/2021 00:36   DG Chest 2 View  Result Date: 08/11/2021 CLINICAL DATA:  Weakness. EXAM: CHEST - 2 VIEW COMPARISON:  July 30, 2021. FINDINGS: The heart size and mediastinal contours are within normal limits. Both lungs are clear. The visualized skeletal structures are unremarkable. IMPRESSION: No active cardiopulmonary disease. Electronically Signed  By: Marijo Conception M.D.   On: 08/11/2021 08:13   CT Head Wo Contrast  Result Date: 07/30/2021 CLINICAL DATA:  Altered mental status. Loss of consciousness. Fall. EXAM: CT HEAD WITHOUT CONTRAST TECHNIQUE: Contiguous axial images were obtained from the base of the skull through the vertex without intravenous contrast. RADIATION DOSE REDUCTION: This exam was performed according to the departmental dose-optimization program which includes automated exposure control, adjustment of the mA and/or kV according to patient size and/or use of iterative reconstruction technique. COMPARISON:  07/21/2021 FINDINGS: Brain: No  evidence of acute infarction, hemorrhage, hydrocephalus, extra-axial collection or mass lesion/mass effect. There is mild diffuse low-attenuation within the subcortical and periventricular white matter compatible with chronic microvascular disease. Prominence of the sulci and the ventricles compatible with brain atrophy. Vascular: No hyperdense vessel or unexpected calcification. Skull: Normal. Negative for fracture or focal lesion. Sinuses/Orbits: No acute finding. Other: None. IMPRESSION: 1. No acute intracranial abnormalities. 2. Chronic small vessel ischemic disease and brain atrophy. Electronically Signed   By: Kerby Moors M.D.   On: 07/30/2021 15:08   CT Head Wo Contrast  Result Date: 07/21/2021 CLINICAL DATA:  Mental status changes EXAM: CT HEAD WITHOUT CONTRAST TECHNIQUE: Contiguous axial images were obtained from the base of the skull through the vertex without intravenous contrast. RADIATION DOSE REDUCTION: This exam was performed according to the departmental dose-optimization program which includes automated exposure control, adjustment of the mA and/or kV according to patient size and/or use of iterative reconstruction technique. COMPARISON:  12/22/2020 FINDINGS: Brain: There is atrophy and chronic small vessel disease changes. No acute intracranial abnormality. Specifically, no hemorrhage, hydrocephalus, mass lesion, acute infarction, or significant intracranial injury. Vascular: No hyperdense vessel or unexpected calcification. Skull: No acute calvarial abnormality. Sinuses/Orbits: No acute findings Other: None IMPRESSION: Atrophy, chronic microvascular disease. No acute intracranial abnormality. Electronically Signed   By: Rolm Baptise M.D.   On: 07/21/2021 00:38   MR BRAIN WO CONTRAST  Result Date: 07/30/2021 CLINICAL DATA:  Dizziness EXAM: MRI HEAD WITHOUT CONTRAST TECHNIQUE: Multiplanar, multiecho pulse sequences of the brain and surrounding structures were obtained without intravenous  contrast. COMPARISON:  No prior MRI head, correlation is made with CT head 07/30/2021 FINDINGS: Brain: No restricted diffusion to suggest acute or subacute infarct. No acute hemorrhage, mass, mass effect, or midline shift. No hydrocephalus or extra-axial collection. Ventriculomegaly, likely secondary to advanced cerebral atrophy for age. Confluent T2 hyperintense signal in the periventricular white matter, likely the sequela of severe chronic small vessel ischemic disease. Vascular: Normal flow voids. Skull and upper cervical spine: Normal marrow signal. Sinuses/Orbits: Negative. Other: None. IMPRESSION: No acute intracranial process. Cerebral atrophy, which appears advanced for age. Electronically Signed   By: Merilyn Baba M.D.   On: 07/30/2021 21:47   DG Chest Portable 1 View  Result Date: 07/30/2021 CLINICAL DATA:  Syncope.  History of CHF. EXAM: PORTABLE CHEST 1 VIEW COMPARISON:  07/20/2021 FINDINGS: Heart size is normal. Lungs appear hyperinflated but clear. There is no pleural effusion or edema. No airspace opacities identified. Review of the visualized osseous structures is unremarkable. IMPRESSION: No active cardiopulmonary abnormalities. Electronically Signed   By: Kerby Moors M.D.   On: 07/30/2021 14:38   DG Chest Port 1 View  Result Date: 07/20/2021 CLINICAL DATA:  Altered mental status EXAM: PORTABLE CHEST 1 VIEW COMPARISON:  07/07/2021 FINDINGS: The heart size and mediastinal contours are within normal limits. Both lungs are clear. The visualized skeletal structures are unremarkable. IMPRESSION: No active disease. Electronically Signed  By: Donavan Foil M.D.   On: 07/20/2021 23:41   DG Abd 2 Views  Result Date: 08/11/2021 CLINICAL DATA:  Decreased urine output.  No bowel movement. EXAM: ABDOMEN - 2 VIEW COMPARISON:  None. FINDINGS: The bowel gas pattern is normal. There is no evidence of free air. No radio-opaque calculi or other significant radiographic abnormality is seen. IMPRESSION:  Negative. Electronically Signed   By: Marijo Conception M.D.   On: 08/11/2021 08:12    Microbiology: Results for orders placed or performed during the hospital encounter of 08/11/21  Resp Panel by RT-PCR (Flu A&B, Covid) Nasopharyngeal Swab     Status: None   Collection Time: 08/11/21  7:14 AM   Specimen: Nasopharyngeal Swab; Nasopharyngeal(NP) swabs in vial transport medium  Result Value Ref Range Status   SARS Coronavirus 2 by RT PCR NEGATIVE NEGATIVE Final    Comment: (NOTE) SARS-CoV-2 target nucleic acids are NOT DETECTED.  The SARS-CoV-2 RNA is generally detectable in upper respiratory specimens during the acute phase of infection. The lowest concentration of SARS-CoV-2 viral copies this assay can detect is 138 copies/mL. A negative result does not preclude SARS-Cov-2 infection and should not be used as the sole basis for treatment or other patient management decisions. A negative result may occur with  improper specimen collection/handling, submission of specimen other than nasopharyngeal swab, presence of viral mutation(s) within the areas targeted by this assay, and inadequate number of viral copies(<138 copies/mL). A negative result must be combined with clinical observations, patient history, and epidemiological information. The expected result is Negative.  Fact Sheet for Patients:  EntrepreneurPulse.com.au  Fact Sheet for Healthcare Providers:  IncredibleEmployment.be  This test is no t yet approved or cleared by the [place] FDA and  has been authorized for detection and/or diagnosis of SARS-CoV-2 by FDA under an Emergency Use Authorization (EUA). This EUA will remain  in effect (meaning this test can be used) for the duration of the COVID-19 declaration under Section 564(b)(1) of the Act, 21 U.S.C.section 360bbb-3(b)(1), unless the authorization is terminated  or revoked sooner.       Influenza A by PCR NEGATIVE NEGATIVE  Final   Influenza B by PCR NEGATIVE NEGATIVE Final    Comment: (NOTE) The Xpert Xpress SARS-CoV-2/FLU/RSV plus assay is intended as an aid in the diagnosis of influenza from Nasopharyngeal swab specimens and should not be used as a sole basis for treatment. Nasal washings and aspirates are unacceptable for Xpert Xpress SARS-CoV-2/FLU/RSV testing.  Fact Sheet for Patients: EntrepreneurPulse.com.au  Fact Sheet for Healthcare Providers: IncredibleEmployment.be  This test is not yet approved or cleared by the [place] FDA and has been authorized for detection and/or diagnosis of SARS-CoV-2 by FDA under an Emergency Use Authorization (EUA). This EUA will remain in effect (meaning this test can be used) for the duration of the COVID-19 declaration under Section 564(b)(1) of the Act, 21 U.S.C. section 360bbb-3(b)(1), unless the authorization is terminated or revoked.  Performed at [facility], [address] [place], [place] [address]     Labs: CBC: Recent Labs  Lab 08/11/21 0714 08/12/21 0456  WBC 5.1 4.3  NEUTROABS 4.1  --   HGB 11.9* 11.1*  HCT 37.3* 34.3*  MCV 91.6 91.2  PLT 218 366   Basic Metabolic Panel: Recent Labs  Lab 08/12/21 0456 08/13/21 0550 08/14/21 0625 08/15/21 0330 08/16/21 0426  NA 140 140 142 142 141  K 3.8 3.5 4.2 3.8 4.0  CL 105 106 107 108 106  CO2 28 29 31 27 28   GLUCOSE 86 130* 96 108* 87  BUN 44* 28* 18 14 13   CREATININE 1.79* 1.40* 1.27* 1.08 1.17  CALCIUM 8.8* 8.4* 8.7* 8.3* 8.3*  MG  --   --   --  1.9  --    Liver Function Tests: Recent Labs  Lab 08/11/21 0714  AST 24  ALT 12  ALKPHOS 66  BILITOT 0.7  PROT 7.1  ALBUMIN 3.2*   CBG: Recent Labs  Lab 08/15/21 0911 08/15/21 1243 08/15/21 1649 08/15/21 2100 08/16/21 0740  GLUCAP 71 141* 67* 89 76    Discharge time spent: greater than 30 minutes.  Signed: Sharen Hones, MD [facility] 08/16/2021

## 2021-08-16 NOTE — Progress Notes (Signed)
Nutrition Follow-up  DOCUMENTATION CODES:   Not applicable  INTERVENTION:   -Downgrade diet to dysphagia 3 for ease of intake -Continue Ensure Enlive po TID, each supplement provides 350 kcal and 20 grams of protein.  -Continue 30 ml Prosource Plus TID, each supplement provides 100 kcals and 15 grams -Continue MVI with minerals daily  NUTRITION DIAGNOSIS:   Predicted suboptimal nutrient intake related to chronic illness (dementia) as evidenced by estimated needs.  Ongoing  GOAL:   Patient will meet greater than or equal to 90% of their needs  Progressing   MONITOR:   PO intake, Supplement acceptance, Labs, Weight trends, Skin, I & O's  REASON FOR ASSESSMENT:   Malnutrition Screening Tool    ASSESSMENT:   Dillon Schultz is a 76 y.o. male with medical history significant for dementia with behavioral disturbances, hypertension, functional constipation, multiple hospitalizations in the last several weeks who presents again to the emergency room via EMS for evaluation of poor oral intake, constipation, Patient Has Not Had a Bowel Movement in 1 Week and Multiple "Fall at Home described as "passing out".  Per his son he does not lose consciousness when he falls but is very disoriented.  Reviewed I/O's: +1 L x 24 hours and +5.1 L since admission  UOP: 400 ml x 24 hours  Pt sitting up in bed at time of visit. Noted pt has chewing bacon for extended period of time. He reports that he does not have his teeth with him and is amenable to diet downgrade for ease of intake.    Case discussed with RN, who reports pt has been very agitated, not eating much, and has not been taking meds. Pt able to take meds with RN with juice this morning.   Per RN, possible plan to d/c home today.   Medications reviewed and include remeron, miralax, and senokot.   Labs reviewed: CBGS: 67-141 (inpatient orders for glycemic control are none).    NUTRITION - FOCUSED PHYSICAL EXAM:  Flowsheet Row Most  Recent Value  Orbital Region Mild depletion  Upper Arm Region Moderate depletion  Thoracic and Lumbar Region Mild depletion  Buccal Region Mild depletion  Temple Region Moderate depletion  Clavicle Bone Region Mild depletion  Clavicle and Acromion Bone Region Mild depletion  Scapular Bone Region Mild depletion  Dorsal Hand Mild depletion  Patellar Region Mild depletion  Anterior Thigh Region Mild depletion  Posterior Calf Region Mild depletion  Edema (RD Assessment) None  Hair Reviewed  Eyes Reviewed  Mouth Reviewed  Skin Reviewed  Nails Reviewed       Diet Order:   Diet Order             Diet - low sodium heart healthy           Diet regular Room service appropriate? Yes; Fluid consistency: Thin  Diet effective now                   EDUCATION NEEDS:   Not appropriate for education at this time  Skin:  Skin Assessment: Skin Integrity Issues: Skin Integrity Issues:: Other (Comment) Other: open scab to lt pretibial  Last BM:  Unknown  Height:   Ht Readings from Last 1 Encounters:  08/11/21 5\' 8"  (1.727 m)    Weight:   Wt Readings from Last 1 Encounters:  08/11/21 64 kg    Ideal Body Weight:  70 kg  BMI:  Body mass index is 21.45 kg/m.  Estimated Nutritional Needs:   Kcal:  1900-2100  Protein:  95-110 grams  Fluid:  > 1.9 L    Loistine Chance, RD, LDN, [place] Registered Dietitian II Certified Diabetes Care and Education Specialist Please refer to [facility] for RD and/or RD on-call/weekend/after hours pager

## 2021-08-16 NOTE — TOC Progression Note (Addendum)
Transition of Care [facility]) - Progression Note    Patient Details  Name: Dillon Schultz MRN: [id number] Date of Birth: June 15, 1946  Transition of Care [facility]) CM/SW [place], RN Phone Number: 08/16/2021, 10:51 AM  Clinical Narrative:   Spoke with the patient's son and he stated that he will be there when EMS arrives, the patient will go today With EMS transport, The nurse is requested to call the son once EMS arrives to pick up  EMS called and there are 3 ahead of him   Expected Discharge Plan: Home/Self Care Barriers to Discharge: Continued Medical Work up  Expected Discharge Plan and Services Expected Discharge Plan: Home/Self Care   Discharge Planning Services: CM Consult   Living arrangements for the past 2 months: Single Family Home Expected Discharge Date: 08/16/21                              Patient developed significant hypoglycemia.     Will cancel discharge. Check cortisol level. Give D50.    Social Determinants of Health (SDOH) Interventions    Readmission Risk Interventions No flowsheet data found.

## 2021-08-16 NOTE — Progress Notes (Signed)
Patient son was notified of cancelled discharged for today. We will continue to assess and monitor.

## 2021-08-16 NOTE — Progress Notes (Signed)
Pt did not sleep at all during shift.  He was agitated and combative all night.  He made several attempts to get oob; yelling, hallucinating, uncooperative with staff and care, pulling at IV lines, banging on the table and side rails.  Nursing staff utilized verbal redirection, comfort and incontinence care, 1:1 monitoring and PRN meds for agitation with little effect.  Pt also refused all HS medications except for Remeron.

## 2021-08-17 DIAGNOSIS — R55 Syncope and collapse: Secondary | ICD-10-CM

## 2021-08-17 DIAGNOSIS — N179 Acute kidney failure, unspecified: Secondary | ICD-10-CM | POA: Diagnosis not present

## 2021-08-17 DIAGNOSIS — F03918 Unspecified dementia, unspecified severity, with other behavioral disturbance: Secondary | ICD-10-CM | POA: Diagnosis not present

## 2021-08-17 LAB — CBC WITH DIFFERENTIAL/PLATELET
Abs Immature Granulocytes: 0.01 10*3/uL (ref 0.00–0.07)
Basophils Absolute: 0 10*3/uL (ref 0.0–0.1)
Basophils Relative: 0 %
Eosinophils Absolute: 0.2 10*3/uL (ref 0.0–0.5)
Eosinophils Relative: 3 %
HCT: 32.1 % — ABNORMAL LOW (ref 39.0–52.0)
Hemoglobin: 10.6 g/dL — ABNORMAL LOW (ref 13.0–17.0)
Immature Granulocytes: 0 %
Lymphocytes Relative: 20 %
Lymphs Abs: 0.9 10*3/uL (ref 0.7–4.0)
MCH: 29.5 pg (ref 26.0–34.0)
MCHC: 33 g/dL (ref 30.0–36.0)
MCV: 89.4 fL (ref 80.0–100.0)
Monocytes Absolute: 0.4 10*3/uL (ref 0.1–1.0)
Monocytes Relative: 9 %
Neutro Abs: 3.2 10*3/uL (ref 1.7–7.7)
Neutrophils Relative %: 68 %
Platelets: 175 10*3/uL (ref 150–400)
RBC: 3.59 MIL/uL — ABNORMAL LOW (ref 4.22–5.81)
RDW: 13.9 % (ref 11.5–15.5)
WBC: 4.8 10*3/uL (ref 4.0–10.5)
nRBC: 0 % (ref 0.0–0.2)

## 2021-08-17 LAB — BASIC METABOLIC PANEL
Anion gap: 4 — ABNORMAL LOW (ref 5–15)
BUN: 13 mg/dL (ref 8–23)
CO2: 31 mmol/L (ref 22–32)
Calcium: 8.3 mg/dL — ABNORMAL LOW (ref 8.9–10.3)
Chloride: 107 mmol/L (ref 98–111)
Creatinine, Ser: 1.09 mg/dL (ref 0.61–1.24)
GFR, Estimated: >60 mL/min (ref >60)
Glucose, Bld: 94 mg/dL (ref 70–99)
Potassium: 4 mmol/L (ref 3.5–5.1)
Sodium: 142 mmol/L (ref 135–145)

## 2021-08-17 LAB — MAGNESIUM: Magnesium: 1.8 mg/dL (ref 1.7–2.4)

## 2021-08-17 LAB — GLUCOSE, CAPILLARY
Glucose-Capillary: 81 mg/dL (ref 70–99)
Glucose-Capillary: 113 mg/dL — ABNORMAL HIGH (ref 70–99)

## 2021-08-17 LAB — PHOSPHORUS: Phosphorus: 2.5 mg/dL (ref 2.5–4.6)

## 2021-08-17 MED ORDER — MEGESTROL ACETATE 400 MG/10ML PO SUSP: 200.0000 mg | Freq: Every day | ORAL | 0 refills | Status: DC

## 2021-08-17 MED ORDER — PREDNISONE 2.5 MG PO TABS: 2.5000 mg | ORAL_TABLET | Freq: Two times a day (BID) | ORAL | 0 refills | Status: DC

## 2021-08-17 NOTE — Progress Notes (Addendum)
Discharge Note: EMS transported pt from [facility] to home.  IV intact upon removal.  Pt discharged with all personal belongings.

## 2021-08-17 NOTE — Discharge Summary (Signed)
Physician Discharge Summary   Patient: Dillon Schultz MRN: [id number] DOB: [birth year]  Admit date:     08/11/2021  Discharge date: 08/17/21  Discharge Physician: Sharen Hones   PCP: [name], MD   Recommendations at discharge:   Follow-up with PCP in 1 week. Continue to follow with the palliative care for poor prognosis.   Discharge Diagnoses: Principal Problem:   AKI (acute kidney injury) (Eugenio Saenz) Active Problems:   Dementia with behavioral disturbance   Hypertension   Syncope and collapse   Malnutrition of moderate degree   Falls   Constipation   Dehydration  Resolved Problems:   * No resolved hospital problems. *   Hospital Course: Patient is a unfortunate 76 year old African-American gentleman history of dementia with behavioral disturbances, hypertension, functional constipation, multiple hospitalizations over the past several weeks presenting to the ED via EMS for poor oral intake/FTT, constipation noted not of had a bowel movement in a week multiple falls at home described as passing out however patient did not lose consciousness when he fell but very disoriented.  Patient admitted with constipation, acute kidney injury, dehydration, dementia with behavioral disturbances.  Patient admitted placed on IV fluids home regimen medications continued as well as IV Haldol for agitation.  Patient noted to have been agitated, pulling out lines, refusing medications.  Patient with poor oral intake.  Patient seen by palliative care who discussed with family and decision was made for full scope of care including full code.  Assessment and Plan: Acute kidney injury secondary to dehydration. Syncope and collapse secondary to orthostatic hypotension secondary to dehydration. Renal function has normalized after giving fluids. Patient no longer is dehydrated.  Moderate protein calorie malnutrition. Failure to thrive. Anorexia. Hypoglycemia secondary to poor p.o. intake. Adrenal  insufficiency. Patient appears to be malnourished, with moderate muscle atrophy. He has received fluids, he was also started on Remeron. Patient had a significant hypoglycemia, cortisol level is borderline low with a concurrent hypoglycemia.  Patient has adrenal insufficiency.  Prednisone is started at 2.5 mg twice a day.  Glucose much better today. Due to poor appetite, also we will add lower dose megestrol.   Dementia with behavioral disturbance. Patient is treated with Depakote.  Also received on Zyprexa.   Essential hypertension. Resume home medicines  Constipation. Prescribed Senokot as well as MiraLAX.        Consultants: None Procedures performed: None  Disposition: Home health Diet recommendation:  Discharge Diet Orders (From admission, onward)     Start     Ordered   08/16/21 0000  Diet - low sodium heart healthy        08/16/21 1024           Cardiac diet  DISCHARGE MEDICATION: Allergies as of 08/17/2021       Reactions   Metformin Shortness Of Breath   Made him feel "crazy"    Glucophage [metformin Hcl]    Made him feel "crazy"         Medication List     TAKE these medications    amLODipine 10 MG tablet Commonly known as: NORVASC Take 10 mg by mouth daily.   aspirin 81 MG chewable tablet Chew 81 mg by mouth daily.   citalopram 40 MG tablet Commonly known as: CELEXA Take 40 mg by mouth daily.   divalproex 125 MG DR tablet Commonly known as: DEPAKOTE Take 1 tablet (125 mg total) by mouth every 12 (twelve) hours.   feeding supplement Liqd Take 237 mLs by mouth  3 (three) times daily between meals.   (feeding supplement) PROSource Plus liquid Take 30 mLs by mouth 2 (two) times daily between meals.   gabapentin 100 MG capsule Commonly known as: NEURONTIN Take 100 mg by mouth 3 (three) times daily.   hydrALAZINE 25 MG tablet Commonly known as: APRESOLINE Take 1 tablet (25 mg total) by mouth 2 (two) times daily.   megestrol 400  MG/10ML suspension Commonly known as: MEGACE Take 5 mLs (200 mg total) by mouth daily.   midodrine 5 MG tablet Commonly known as: PROAMATINE Take 1 tablet (5 mg total) by mouth 3 (three) times daily with meals.   mirtazapine 15 MG disintegrating tablet Commonly known as: REMERON SOL-TAB Take 0.5 tablets (7.5 mg total) by mouth at bedtime.   multivitamin with minerals Tabs tablet Take 1 tablet by mouth daily.   polyethylene glycol 17 g packet Commonly known as: MIRALAX / GLYCOLAX Take 17 g by mouth daily.   predniSONE 2.5 MG tablet Commonly known as: DELTASONE Take 1 tablet (2.5 mg total) by mouth 2 (two) times daily with a meal.   senna-docusate 8.6-50 MG tablet Commonly known as: Senokot-S Take 1 tablet by mouth 2 (two) times daily.               Durable Medical Equipment  (From admission, onward)           Start     Ordered   08/16/21 1035  For home use only DME Other see comment  Once       Comments: Bed rail  Question:  Length of Need  Answer:  Lifetime   08/16/21 1034              Discharge Care Instructions  (From admission, onward)           Start     Ordered   08/16/21 0000  Discharge wound care:       Comments: none   08/16/21 1024            Follow-up Information     [name], MD. Go on 08/17/2021.   Specialty: Family Medicine Why: Appt @ 3:20 pm Contact information: [address] [place] [address] [phone]                 Discharge Exam: Filed Weights   08/11/21 0658 08/11/21 2115  Weight: 63 kg 64 kg   General exam: Appears calm and comfortable, moderately malnourished Respiratory system: Clear to auscultation. Respiratory effort normal. Cardiovascular system: S1 & S2 heard, RRR. No JVD, murmurs, rubs, gallops or clicks. No pedal edema. Gastrointestinal system: Abdomen is nondistended, soft and nontender. No organomegaly or masses felt. Normal bowel sounds heard. Central nervous  system: Alert and oriented x2. No focal neurological deficits. Extremities: Symmetric, with moderate muscle atrophy. Skin: No rashes, lesions or ulcers    Condition at discharge: fair  The results of significant diagnostics from this hospitalization (including imaging, microbiology, ancillary and laboratory) are listed below for reference.   Imaging Studies: CT Abdomen Pelvis Wo Contrast  Result Date: 07/21/2021 CLINICAL DATA:  Nausea, vomiting, diarrhea for EXAM: CT ABDOMEN AND PELVIS WITHOUT CONTRAST TECHNIQUE: Multidetector CT imaging of the abdomen and pelvis was performed following the standard protocol without IV contrast. RADIATION DOSE REDUCTION: This exam was performed according to the departmental dose-optimization program which includes automated exposure control, adjustment of the mA and/or kV according to patient size and/or use of iterative reconstruction technique. COMPARISON:  09/05/2009  FINDINGS: Lower chest: Heart no acute abnormality Hepatobiliary: No focal hepatic abnormality. Gallbladder unremarkable. Pancreas: No focal abnormality or ductal dilatation. Spleen: No focal abnormality.  Normal size. Adrenals/Urinary Tract: No adrenal abnormality. No focal renal abnormality. No stones or hydronephrosis. Urinary bladder is unremarkable. Stomach/Bowel: Stomach, large and small bowel grossly unremarkable. Vascular/Lymphatic: Aortic atherosclerosis. No evidence of aneurysm or adenopathy. Reproductive: No visible focal abnormality. Other: No free fluid or free air. Musculoskeletal: No acute bony abnormality. IMPRESSION: No acute findings in the abdomen or pelvis. Aortic atherosclerosis. Electronically Signed   By: Rolm Baptise M.D.   On: 07/21/2021 00:36   DG Chest 2 View  Result Date: 08/11/2021 CLINICAL DATA:  Weakness. EXAM: CHEST - 2 VIEW COMPARISON:  July 30, 2021. FINDINGS: The heart size and mediastinal contours are within normal limits. Both lungs are clear. The visualized  skeletal structures are unremarkable. IMPRESSION: No active cardiopulmonary disease. Electronically Signed   By: Marijo Conception M.D.   On: 08/11/2021 08:13   CT Head Wo Contrast  Result Date: 07/30/2021 CLINICAL DATA:  Altered mental status. Loss of consciousness. Fall. EXAM: CT HEAD WITHOUT CONTRAST TECHNIQUE: Contiguous axial images were obtained from the base of the skull through the vertex without intravenous contrast. RADIATION DOSE REDUCTION: This exam was performed according to the departmental dose-optimization program which includes automated exposure control, adjustment of the mA and/or kV according to patient size and/or use of iterative reconstruction technique. COMPARISON:  07/21/2021 FINDINGS: Brain: No evidence of acute infarction, hemorrhage, hydrocephalus, extra-axial collection or mass lesion/mass effect. There is mild diffuse low-attenuation within the subcortical and periventricular white matter compatible with chronic microvascular disease. Prominence of the sulci and the ventricles compatible with brain atrophy. Vascular: No hyperdense vessel or unexpected calcification. Skull: Normal. Negative for fracture or focal lesion. Sinuses/Orbits: No acute finding. Other: None. IMPRESSION: 1. No acute intracranial abnormalities. 2. Chronic small vessel ischemic disease and brain atrophy. Electronically Signed   By: Kerby Moors M.D.   On: 07/30/2021 15:08   CT Head Wo Contrast  Result Date: 07/21/2021 CLINICAL DATA:  Mental status changes EXAM: CT HEAD WITHOUT CONTRAST TECHNIQUE: Contiguous axial images were obtained from the base of the skull through the vertex without intravenous contrast. RADIATION DOSE REDUCTION: This exam was performed according to the departmental dose-optimization program which includes automated exposure control, adjustment of the mA and/or kV according to patient size and/or use of iterative reconstruction technique. COMPARISON:  12/22/2020 FINDINGS: Brain: There is  atrophy and chronic small vessel disease changes. No acute intracranial abnormality. Specifically, no hemorrhage, hydrocephalus, mass lesion, acute infarction, or significant intracranial injury. Vascular: No hyperdense vessel or unexpected calcification. Skull: No acute calvarial abnormality. Sinuses/Orbits: No acute findings Other: None IMPRESSION: Atrophy, chronic microvascular disease. No acute intracranial abnormality. Electronically Signed   By: Rolm Baptise M.D.   On: 07/21/2021 00:38   MR BRAIN WO CONTRAST  Result Date: 07/30/2021 CLINICAL DATA:  Dizziness EXAM: MRI HEAD WITHOUT CONTRAST TECHNIQUE: Multiplanar, multiecho pulse sequences of the brain and surrounding structures were obtained without intravenous contrast. COMPARISON:  No prior MRI head, correlation is made with CT head 07/30/2021 FINDINGS: Brain: No restricted diffusion to suggest acute or subacute infarct. No acute hemorrhage, mass, mass effect, or midline shift. No hydrocephalus or extra-axial collection. Ventriculomegaly, likely secondary to advanced cerebral atrophy for age. Confluent T2 hyperintense signal in the periventricular white matter, likely the sequela of severe chronic small vessel ischemic disease. Vascular: Normal flow voids. Skull and upper cervical spine: Normal marrow  signal. Sinuses/Orbits: Negative. Other: None. IMPRESSION: No acute intracranial process. Cerebral atrophy, which appears advanced for age. Electronically Signed   By: Merilyn Baba M.D.   On: 07/30/2021 21:47   DG Chest Portable 1 View  Result Date: 07/30/2021 CLINICAL DATA:  Syncope.  History of CHF. EXAM: PORTABLE CHEST 1 VIEW COMPARISON:  07/20/2021 FINDINGS: Heart size is normal. Lungs appear hyperinflated but clear. There is no pleural effusion or edema. No airspace opacities identified. Review of the visualized osseous structures is unremarkable. IMPRESSION: No active cardiopulmonary abnormalities. Electronically Signed   By: Kerby Moors M.D.    On: 07/30/2021 14:38   DG Chest Port 1 View  Result Date: 07/20/2021 CLINICAL DATA:  Altered mental status EXAM: PORTABLE CHEST 1 VIEW COMPARISON:  07/07/2021 FINDINGS: The heart size and mediastinal contours are within normal limits. Both lungs are clear. The visualized skeletal structures are unremarkable. IMPRESSION: No active disease. Electronically Signed   By: Donavan Foil M.D.   On: 07/20/2021 23:41   DG Abd 2 Views  Result Date: 08/11/2021 CLINICAL DATA:  Decreased urine output.  No bowel movement. EXAM: ABDOMEN - 2 VIEW COMPARISON:  None. FINDINGS: The bowel gas pattern is normal. There is no evidence of free air. No radio-opaque calculi or other significant radiographic abnormality is seen. IMPRESSION: Negative. Electronically Signed   By: Marijo Conception M.D.   On: 08/11/2021 08:12    Microbiology: Results for orders placed or performed during the hospital encounter of 08/11/21  Resp Panel by RT-PCR (Flu A&B, Covid) Nasopharyngeal Swab     Status: None   Collection Time: 08/11/21  7:14 AM   Specimen: Nasopharyngeal Swab; Nasopharyngeal(NP) swabs in vial transport medium  Result Value Ref Range Status   SARS Coronavirus 2 by RT PCR NEGATIVE NEGATIVE Final    Comment: (NOTE) SARS-CoV-2 target nucleic acids are NOT DETECTED.  The SARS-CoV-2 RNA is generally detectable in upper respiratory specimens during the acute phase of infection. The lowest concentration of SARS-CoV-2 viral copies this assay can detect is 138 copies/mL. A negative result does not preclude SARS-Cov-2 infection and should not be used as the sole basis for treatment or other patient management decisions. A negative result may occur with  improper specimen collection/handling, submission of specimen other than nasopharyngeal swab, presence of viral mutation(s) within the areas targeted by this assay, and inadequate number of viral copies(<138 copies/mL). A negative result must be combined with clinical  observations, patient history, and epidemiological information. The expected result is Negative.  Fact Sheet for Patients:  EntrepreneurPulse.com.au  Fact Sheet for Healthcare Providers:  IncredibleEmployment.be  This test is no t yet approved or cleared by the [place] FDA and  has been authorized for detection and/or diagnosis of SARS-CoV-2 by FDA under an Emergency Use Authorization (EUA). This EUA will remain  in effect (meaning this test can be used) for the duration of the COVID-19 declaration under Section 564(b)(1) of the Act, 21 U.S.C.section 360bbb-3(b)(1), unless the authorization is terminated  or revoked sooner.       Influenza A by PCR NEGATIVE NEGATIVE Final   Influenza B by PCR NEGATIVE NEGATIVE Final    Comment: (NOTE) The Xpert Xpress SARS-CoV-2/FLU/RSV plus assay is intended as an aid in the diagnosis of influenza from Nasopharyngeal swab specimens and should not be used as a sole basis for treatment. Nasal washings and aspirates are unacceptable for Xpert Xpress SARS-CoV-2/FLU/RSV testing.  Fact Sheet for Patients: EntrepreneurPulse.com.au  Fact Sheet for Healthcare Providers: IncredibleEmployment.be  This test is not yet approved or cleared by the [place] and has been authorized for detection and/or diagnosis of SARS-CoV-2 by FDA under an Emergency Use Authorization (EUA). This EUA will remain in effect (meaning this test can be used) for the duration of the COVID-19 declaration under Section 564(b)(1) of the Act, 21 U.S.C. section 360bbb-3(b)(1), unless the authorization is terminated or revoked.  Performed at [facility], [address] [place], [place] [address]     Labs: CBC: Recent Labs  Lab 08/11/21 [phone] 08/12/21 0456 08/17/21 0332  WBC 5.1 4.3 4.8  NEUTROABS 4.1  --  3.2  HGB 11.9* 11.1* 10.6*  HCT 37.3* 34.3* 32.1*  MCV 91.6 91.2 89.4   PLT 218 179 254   Basic Metabolic Panel: Recent Labs  Lab 08/13/21 0550 08/14/21 0625 08/15/21 0330 08/16/21 0426 08/17/21 0332  NA 140 142 142 141 142  K 3.5 4.2 3.8 4.0 4.0  CL 106 107 108 106 107  CO2 29 31 27 28 31   GLUCOSE 130* 96 108* 87 94  BUN 28* 18 14 13 13   CREATININE 1.40* 1.27* 1.08 1.17 1.09  CALCIUM 8.4* 8.7* 8.3* 8.3* 8.3*  MG  --   --  1.9  --  1.8  PHOS  --   --   --   --  2.5   Liver Function Tests: Recent Labs  Lab 08/11/21 0714  AST 24  ALT 12  ALKPHOS 66  BILITOT 0.7  PROT 7.1  ALBUMIN 3.2*   CBG: Recent Labs  Lab 08/16/21 1121 08/16/21 1125 08/16/21 1205 08/16/21 1643 08/17/21 0744  GLUCAP 47* 64* 142* 74 81    Discharge time spent: less than 30 minutes.  Signed: Sharen Hones, MD [facility] 08/17/2021

## 2021-08-17 NOTE — TOC Transition Note (Signed)
Transition of Care [facility]) - CM/SW Discharge Note   Patient Details  Name: Ahren Pettinger MRN: [id number] Date of Birth: [birth year]  Transition of [facility]) CM/SW Contact:  Eileen Stanford, LCSW Phone Number: 08/17/2021, 1:40 PM   Clinical Narrative:   EMS scheduled for transport home. Forms on chart. RN aware.    Final next level of care: Home/Self Care Barriers to Discharge: Continued Medical Work up   Patient Goals and CMS Choice Patient states their goals for this hospitalization and ongoing recovery are:: To go home CMS Medicare.gov Compare Post Acute Care list provided to:: Patient    Discharge Placement                       Discharge Plan and Services   Discharge Planning Services: CM Consult                                 Social Determinants of Health (SDOH) Interventions     Readmission Risk Interventions No flowsheet data found.

## 2021-08-18 ENCOUNTER — Telehealth: Payer: Self-pay

## 2021-08-18 NOTE — Telephone Encounter (Signed)
Spoke with patient's son [name] and scheduled a Telephonic Palliative Consult for 08/22/21 @ 1 PM.   Consent obtained; updated Outlook/Netsmart/Team List and Epic.

## 2021-08-19 ENCOUNTER — Encounter: Payer: Self-pay | Admitting: Emergency Medicine

## 2021-08-19 ENCOUNTER — Emergency Department: Payer: Medicare Other

## 2021-08-19 ENCOUNTER — Other Ambulatory Visit: Payer: Self-pay

## 2021-08-19 ENCOUNTER — Inpatient Hospital Stay: Payer: Medicare Other

## 2021-08-19 ENCOUNTER — Inpatient Hospital Stay
Admission: EM | Admit: 2021-08-19 | Discharge: 2021-08-23 | DRG: 682 | Disposition: A | Discharge status: Hospice | Payer: Medicare Other | Attending: Obstetrics and Gynecology | Admitting: Obstetrics and Gynecology

## 2021-08-19 DIAGNOSIS — G3183 Dementia with Lewy bodies: Secondary | ICD-10-CM | POA: Diagnosis present

## 2021-08-19 DIAGNOSIS — Z7982 Long term (current) use of aspirin: Secondary | ICD-10-CM

## 2021-08-19 DIAGNOSIS — M4712 Other spondylosis with myelopathy, cervical region: Secondary | ICD-10-CM | POA: Diagnosis present

## 2021-08-19 DIAGNOSIS — I110 Hypertensive heart disease with heart failure: Secondary | ICD-10-CM | POA: Diagnosis present

## 2021-08-19 DIAGNOSIS — R296 Repeated falls: Secondary | ICD-10-CM | POA: Diagnosis present

## 2021-08-19 DIAGNOSIS — Z87891 Personal history of nicotine dependence: Secondary | ICD-10-CM

## 2021-08-19 DIAGNOSIS — Z515 Encounter for palliative care: Secondary | ICD-10-CM

## 2021-08-19 DIAGNOSIS — R627 Adult failure to thrive: Secondary | ICD-10-CM | POA: Diagnosis present

## 2021-08-19 DIAGNOSIS — F0284 Dementia in other diseases classified elsewhere, unspecified severity, with anxiety: Secondary | ICD-10-CM | POA: Diagnosis present

## 2021-08-19 DIAGNOSIS — Z79899 Other long term (current) drug therapy: Secondary | ICD-10-CM

## 2021-08-19 DIAGNOSIS — M4802 Spinal stenosis, cervical region: Secondary | ICD-10-CM | POA: Diagnosis present

## 2021-08-19 DIAGNOSIS — Z6824 Body mass index (BMI) 24.0-24.9, adult: Secondary | ICD-10-CM | POA: Diagnosis not present

## 2021-08-19 DIAGNOSIS — E440 Moderate protein-calorie malnutrition: Secondary | ICD-10-CM | POA: Diagnosis present

## 2021-08-19 DIAGNOSIS — M79606 Pain in leg, unspecified: Secondary | ICD-10-CM | POA: Diagnosis present

## 2021-08-19 DIAGNOSIS — R937 Abnormal findings on diagnostic imaging of other parts of musculoskeletal system: Secondary | ICD-10-CM | POA: Diagnosis not present

## 2021-08-19 DIAGNOSIS — D649 Anemia, unspecified: Secondary | ICD-10-CM | POA: Diagnosis present

## 2021-08-19 DIAGNOSIS — F02818 Dementia in other diseases classified elsewhere, unspecified severity, with other behavioral disturbance: Secondary | ICD-10-CM | POA: Diagnosis present

## 2021-08-19 DIAGNOSIS — J9601 Acute respiratory failure with hypoxia: Secondary | ICD-10-CM | POA: Diagnosis present

## 2021-08-19 DIAGNOSIS — Z20822 Contact with and (suspected) exposure to covid-19: Secondary | ICD-10-CM | POA: Diagnosis present

## 2021-08-19 DIAGNOSIS — E860 Dehydration: Secondary | ICD-10-CM | POA: Diagnosis present

## 2021-08-19 DIAGNOSIS — I82812 Embolism and thrombosis of superficial veins of left lower extremities: Secondary | ICD-10-CM | POA: Diagnosis present

## 2021-08-19 DIAGNOSIS — W19XXXA Unspecified fall, initial encounter: Secondary | ICD-10-CM | POA: Diagnosis present

## 2021-08-19 DIAGNOSIS — I5032 Chronic diastolic (congestive) heart failure: Secondary | ICD-10-CM | POA: Diagnosis present

## 2021-08-19 DIAGNOSIS — I951 Orthostatic hypotension: Secondary | ICD-10-CM | POA: Diagnosis present

## 2021-08-19 DIAGNOSIS — M79604 Pain in right leg: Secondary | ICD-10-CM | POA: Diagnosis present

## 2021-08-19 DIAGNOSIS — Z91199 Patient's noncompliance with other medical treatment and regimen due to unspecified reason: Secondary | ICD-10-CM

## 2021-08-19 DIAGNOSIS — M79605 Pain in left leg: Secondary | ICD-10-CM | POA: Diagnosis present

## 2021-08-19 DIAGNOSIS — R001 Bradycardia, unspecified: Secondary | ICD-10-CM | POA: Diagnosis present

## 2021-08-19 DIAGNOSIS — F03918 Unspecified dementia, unspecified severity, with other behavioral disturbance: Secondary | ICD-10-CM

## 2021-08-19 DIAGNOSIS — I10 Essential (primary) hypertension: Secondary | ICD-10-CM

## 2021-08-19 DIAGNOSIS — M5001 Cervical disc disorder with myelopathy,  high cervical region: Secondary | ICD-10-CM | POA: Diagnosis present

## 2021-08-19 DIAGNOSIS — Z7952 Long term (current) use of systemic steroids: Secondary | ICD-10-CM

## 2021-08-19 DIAGNOSIS — Z66 Do not resuscitate: Secondary | ICD-10-CM | POA: Diagnosis not present

## 2021-08-19 DIAGNOSIS — N179 Acute kidney failure, unspecified: Principal | ICD-10-CM | POA: Diagnosis present

## 2021-08-19 DIAGNOSIS — E785 Hyperlipidemia, unspecified: Secondary | ICD-10-CM | POA: Diagnosis present

## 2021-08-19 DIAGNOSIS — Z888 Allergy status to other drugs, medicaments and biological substances status: Secondary | ICD-10-CM

## 2021-08-19 DIAGNOSIS — Z7189 Other specified counseling: Secondary | ICD-10-CM

## 2021-08-19 DIAGNOSIS — J9811 Atelectasis: Secondary | ICD-10-CM | POA: Diagnosis present

## 2021-08-19 DIAGNOSIS — E2740 Unspecified adrenocortical insufficiency: Secondary | ICD-10-CM | POA: Diagnosis present

## 2021-08-19 DIAGNOSIS — I440 Atrioventricular block, first degree: Secondary | ICD-10-CM | POA: Diagnosis present

## 2021-08-19 LAB — RESP PANEL BY RT-PCR (FLU A&B, COVID) ARPGX2
Influenza A by PCR: NEGATIVE
Influenza B by PCR: NEGATIVE
SARS Coronavirus 2 by RT PCR: NEGATIVE

## 2021-08-19 LAB — HEPATIC FUNCTION PANEL
ALT: 14 U/L (ref 0–44)
AST: 25 U/L (ref 15–41)
Albumin: 3.1 g/dL — ABNORMAL LOW (ref 3.5–5.0)
Alkaline Phosphatase: 64 U/L (ref 38–126)
Bilirubin, Direct: 0.2 mg/dL (ref 0.0–0.2)
Indirect Bilirubin: 0.3 mg/dL (ref 0.3–0.9)
Total Bilirubin: 0.5 mg/dL (ref 0.3–1.2)
Total Protein: 6 g/dL — ABNORMAL LOW (ref 6.5–8.1)

## 2021-08-19 LAB — CBC
HCT: 33.8 % — ABNORMAL LOW (ref 39.0–52.0)
Hemoglobin: 10.6 g/dL — ABNORMAL LOW (ref 13.0–17.0)
MCH: 29.3 pg (ref 26.0–34.0)
MCHC: 31.4 g/dL (ref 30.0–36.0)
MCV: 93.4 fL (ref 80.0–100.0)
Platelets: 198 10*3/uL (ref 150–400)
RBC: 3.62 MIL/uL — ABNORMAL LOW (ref 4.22–5.81)
RDW: 14.6 % (ref 11.5–15.5)
WBC: 7.7 10*3/uL (ref 4.0–10.5)
nRBC: 0 % (ref 0.0–0.2)

## 2021-08-19 LAB — BASIC METABOLIC PANEL
Anion gap: 11 (ref 5–15)
BUN: 33 mg/dL — ABNORMAL HIGH (ref 8–23)
CO2: 27 mmol/L (ref 22–32)
Calcium: 9 mg/dL (ref 8.9–10.3)
Chloride: 102 mmol/L (ref 98–111)
Creatinine, Ser: 2.61 mg/dL — ABNORMAL HIGH (ref 0.61–1.24)
GFR, Estimated: 25 mL/min — ABNORMAL LOW (ref >60)
Glucose, Bld: 101 mg/dL — ABNORMAL HIGH (ref 70–99)
Potassium: 4.4 mmol/L (ref 3.5–5.1)
Sodium: 140 mmol/L (ref 135–145)

## 2021-08-19 LAB — CK: Total CK: 125 U/L (ref 49–397)

## 2021-08-19 LAB — VALPROIC ACID LEVEL: Valproic Acid Lvl: 11 ug/mL — ABNORMAL LOW (ref 50.0–100.0)

## 2021-08-19 MED ORDER — ONDANSETRON HCL 4 MG/2ML IJ SOLN: 4.0000 mg | Freq: Four times a day (QID) | INTRAMUSCULAR | Status: DC | PRN

## 2021-08-19 MED ORDER — LACTATED RINGERS IV BOLUS
1000.0000 mL | Freq: Once | INTRAVENOUS | Status: AC
  Administered 2021-08-19: 1000 mL via INTRAVENOUS

## 2021-08-19 MED ORDER — TRAMADOL HCL 50 MG PO TABS
50.0000 mg | ORAL_TABLET | Freq: Four times a day (QID) | ORAL | Status: DC | PRN
  Administered 2021-08-19 – 2021-08-22 (×2): 50 mg via ORAL
  Filled 2021-08-19 (×2): qty 1

## 2021-08-19 MED ORDER — HEPARIN SODIUM (PORCINE) 5000 UNIT/ML IJ SOLN
5000.0000 [IU] | Freq: Three times a day (TID) | INTRAMUSCULAR | Status: DC
  Administered 2021-08-19 – 2021-08-22 (×8): 5000 [IU] via SUBCUTANEOUS
  Filled 2021-08-19 (×9): qty 1

## 2021-08-19 MED ORDER — ONDANSETRON HCL 4 MG PO TABS: 4.0000 mg | ORAL_TABLET | Freq: Four times a day (QID) | ORAL | Status: DC | PRN

## 2021-08-19 MED ORDER — DIVALPROEX SODIUM 125 MG PO DR TAB
125.0000 mg | DELAYED_RELEASE_TABLET | Freq: Two times a day (BID) | ORAL | Status: DC
  Administered 2021-08-19 – 2021-08-23 (×8): 125 mg via ORAL
  Filled 2021-08-19 (×9): qty 1

## 2021-08-19 MED ORDER — PROSOURCE PLUS PO LIQD
30.0000 mL | Freq: Two times a day (BID) | ORAL | Status: DC
  Filled 2021-08-19 (×4): qty 30

## 2021-08-19 MED ORDER — CITALOPRAM HYDROBROMIDE 20 MG PO TABS
40.0000 mg | ORAL_TABLET | Freq: Every day | ORAL | Status: DC
  Administered 2021-08-19 – 2021-08-23 (×5): 40 mg via ORAL
  Filled 2021-08-19 (×5): qty 2

## 2021-08-19 MED ORDER — ASPIRIN 81 MG PO CHEW
81.0000 mg | CHEWABLE_TABLET | Freq: Every day | ORAL | Status: DC
  Administered 2021-08-19 – 2021-08-21 (×3): 81 mg via ORAL
  Filled 2021-08-19 (×3): qty 1

## 2021-08-19 MED ORDER — MIRTAZAPINE 15 MG PO TBDP
7.5000 mg | ORAL_TABLET | Freq: Every day | ORAL | Status: DC
  Administered 2021-08-20 – 2021-08-22 (×3): 7.5 mg via ORAL
  Filled 2021-08-19 (×5): qty 0.5

## 2021-08-19 MED ORDER — SENNOSIDES-DOCUSATE SODIUM 8.6-50 MG PO TABS
1.0000 | ORAL_TABLET | Freq: Two times a day (BID) | ORAL | Status: DC
  Administered 2021-08-19 – 2021-08-23 (×7): 1 via ORAL
  Filled 2021-08-19 (×8): qty 1

## 2021-08-19 MED ORDER — SODIUM CHLORIDE 0.9% FLUSH
3.0000 mL | Freq: Two times a day (BID) | INTRAVENOUS | Status: DC
  Administered 2021-08-19 – 2021-08-23 (×7): 3 mL via INTRAVENOUS

## 2021-08-19 MED ORDER — MIDODRINE HCL 5 MG PO TABS
5.0000 mg | ORAL_TABLET | Freq: Three times a day (TID) | ORAL | Status: DC
  Administered 2021-08-19 – 2021-08-21 (×5): 5 mg via ORAL
  Filled 2021-08-19 (×6): qty 1

## 2021-08-19 MED ORDER — SODIUM CHLORIDE 0.9 % IV SOLN: INTRAVENOUS | Status: DC

## 2021-08-19 MED ORDER — ACETAMINOPHEN 325 MG PO TABS
650.0000 mg | ORAL_TABLET | Freq: Four times a day (QID) | ORAL | Status: DC | PRN
  Administered 2021-08-22: 650 mg via ORAL
  Filled 2021-08-19: qty 2

## 2021-08-19 MED ORDER — PREDNISONE 2.5 MG PO TABS
2.5000 mg | ORAL_TABLET | Freq: Two times a day (BID) | ORAL | Status: DC
  Administered 2021-08-19 – 2021-08-23 (×6): 2.5 mg via ORAL
  Filled 2021-08-19 (×9): qty 1

## 2021-08-19 MED ORDER — GABAPENTIN 100 MG PO CAPS
100.0000 mg | ORAL_CAPSULE | Freq: Three times a day (TID) | ORAL | Status: DC
Start: 2021-08-19 — End: 2021-08-20
  Administered 2021-08-19 – 2021-08-20 (×3): 100 mg via ORAL
  Filled 2021-08-19 (×3): qty 1

## 2021-08-19 MED ORDER — ACETAMINOPHEN 650 MG RE SUPP: 650.0000 mg | Freq: Four times a day (QID) | RECTAL | Status: DC | PRN

## 2021-08-19 MED ORDER — MEGESTROL ACETATE 400 MG/10ML PO SUSP
200.0000 mg | Freq: Every day | ORAL | Status: DC
  Administered 2021-08-19 – 2021-08-23 (×4): 200 mg via ORAL
  Filled 2021-08-19 (×5): qty 10

## 2021-08-19 NOTE — ED Triage Notes (Signed)
Pt via EMS from home. Pt c/o generalized weakness and multiple falls for the past couple days.  EMS reports bilateral leg swelling. Son states decreased appetite. Pt is baseline per son.   137/78  64 HR 94% on RA 20G L AC

## 2021-08-19 NOTE — Progress Notes (Signed)
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 No obvious cord signal abnormality/edema on limited exam. 3. At least mild canal stenosis from C2-C3 through C6-C7. Multilevel high-grade bilateral foraminal stenosis, as detailed above.  Neurosurgery is aware of the results and plan on evaluating patient and having further discussions with his family.  This could be a cause for his frequent falls.  Advised nothing to do at this time.

## 2021-08-19 NOTE — Plan of Care (Signed)

## 2021-08-19 NOTE — Assessment & Plan Note (Addendum)
See above

## 2021-08-19 NOTE — Assessment & Plan Note (Addendum)
Recurrent AKI on admission. CK normal.  This appears to be due to poor oral intake.    Creatinine up to 2.6 on admission from baseline 1.1, down to 1.5 with fluids.  This has been a recurrent pattern the last 2 months, this is the 5th admission for same with same outcome (some improvement with IV fluids).

## 2021-08-19 NOTE — ED Provider Notes (Signed)
[facility] Provider Note    Event Date/Time   First MD Initiated Contact with Patient 08/19/21 1155     (approximate)   History   Fall and Weakness   HPI  Dillon Schultz is a 76 y.o. male  history of dementia with behavioral disturbances, hypertension, functional constipation, and recent admission 2/17 to 2/23 for acute injury and syncope secondary to some dehydration with concerns for adrenal insufficiency started on low-dose prednisone.  Patient was discharged with plans for home health it seems.  On arrival to emergency room patient states he is not sure why he is here and has no acute complaints.  I was able to reach his son and spouse over the phone.  They state that since coming home he has had a couple falls and they want him to get checked out for this to make sure he has no injuries.  He reportedly has been taking his medications.  No recent fevers, vomiting cough or other clear sick symptoms.      Physical Exam  Triage Vital Signs: ED Triage Vitals [08/19/21 1136]  Enc Vitals Group     BP 118/77     Pulse Rate 68     Resp 18     Temp 97.8 F (36.6 C)     Temp src      SpO2 100 %     Weight 160 lb (72.6 kg)     Height 5\' 8"  (1.727 m)     Head Circumference      Peak Flow      Pain Score 0     Pain Loc      Pain Edu?      Excl. in [place]?     Most recent vital signs: Vitals:   08/19/21 1136  BP: 118/77  Pulse: 68  Resp: 18  Temp: 97.8 F (36.6 C)  SpO2: 100%    General: Awake, no distress.  Mucous membranes. CV:  Good peripheral perfusion.  2+ plus radial pulse.  Slightly prolonged capillary refill. Resp:  Normal effort.  Abd:  No distention.  Other:  Patient is not oriented.  He is moving all extremities spontaneously.  He does not participate in finger-nose or pronator drift testing.  PERRLA.  EOMI.  No apparent tenderness over the C/T/L-spine.  There is some lower extremity edema but patient is moving extremities  spontaneously.   ED Results / Procedures / Treatments  Labs (all labs ordered are listed, but only abnormal results are displayed) Labs Reviewed  BASIC METABOLIC PANEL - Abnormal; Notable for the following components:      Result Value   Glucose, Bld 101 (*)    BUN 33 (*)    Creatinine, Ser 2.61 (*)    GFR, Estimated 25 (*)    All other components within normal limits  CBC - Abnormal; Notable for the following components:   RBC 3.62 (*)    Hemoglobin 10.6 (*)    HCT 33.8 (*)    All other components within normal limits  RESP PANEL BY RT-PCR (FLU A&B, COVID) ARPGX2  URINALYSIS, ROUTINE W REFLEX MICROSCOPIC  VALPROIC ACID LEVEL  CBG MONITORING, ED     EKG  ECG is remarkable sinus rhythm with apparent artifact with otherwise normal axis, unremarkable.  Unremarkable intervals and no obvious evidence of acute ischemia or significant arrhythmia within limitations of artifact.   RADIOLOGY Chest x-ray interpretation shows no rib fracture, focal consolidation, effusion, edema, pneumothorax or any other clear  acute thoracic process.  I also reviewed radiologist interpretation and agree with their findings of no acute process of there is evidence of some aortic atherosclerosis.  Pelvic x-ray my interpretation shows no acute fracture or dislocation.  As reviewed radiology's findings and agree with their interpretation of no acute process.  CT head on my interpretation shows no evidence of skull fracture, intracranial hemorrhage, edema mass effect or other clear process.  As reviewed radiology interpretation and agree with the findings of no acute process.  CT C-spine, interpretation shows no acute fracture.  As reviewed radiology findings and agree with their findings of some spondylosis and possible cord compression at C3 and C4.  PROCEDURES:  Critical Care performed: No  Procedures    MEDICATIONS ORDERED IN ED: Medications  lactated ringers bolus 1,000 mL (1,000 mLs  Intravenous New Bag/Given 08/19/21 1302)     IMPRESSION / MDM / ASSESSMENT AND PLAN / ED COURSE  I reviewed the triage vital signs and the nursing notes.                              Differential diagnosis includes, but is not limited to recurrent falls due to arrhythmia, anemia, dehydration, metabolic derangements possibly progression of underlying dementia.  No obvious injury on exam although given his age and underlying dementia will obtain CT head and C-spine as well as chest and pelvis plain film to assess for significant orthopedic injuries.  ECG is remarkable sinus rhythm with apparent artifact with otherwise normal axis, unremarkable.  Unremarkable intervals and no obvious evidence of acute ischemia or significant arrhythmia within limitations of artifact.  Chest x-ray interpretation shows no rib fracture, focal consolidation, effusion, edema, pneumothorax or any other clear acute thoracic process.  I also reviewed radiologist interpretation and agree with their findings of no acute process of there is evidence of some aortic atherosclerosis.  Pelvic x-ray my interpretation shows no acute fracture or dislocation.  As reviewed radiology's findings and agree with their interpretation of no acute process.  CT head on my interpretation shows no evidence of skull fracture, intracranial hemorrhage, edema mass effect or other clear process.  As reviewed radiology interpretation and agree with the findings of no acute process.  CT C-spine, interpretation shows no acute fracture.  As reviewed radiology findings and agree with their findings of some spondylosis and possible cord compression at C3 and C4.  CBC without leukocytosis or acute anemia.  BMP is remarkable for an AKI with a creatinine of 2.61 compared to 1.092 days ago without other significant lecture metabolic derangements.  COVID and influenza is negative.  Given concern for possible cervical injury at sleep 3 and C4 I did reach out  to the on-call neurosurgeon Dr. Jerilynn Mages Abd-El-Bar.  He recommends MR C-spine which was ordered.  This was placed.  We will plan admit to medicine service for further evaluation management.  Discussed with hospitalist will place additional orders.   FINAL CLINICAL IMPRESSION(S) / ED DIAGNOSES   Final diagnoses:  AKI (acute kidney injury) ([place])  Dehydration  Recurrent falls     Rx / DC Orders   ED Discharge Orders     None        Note:  This document was prepared using Dragon voice recognition software and may include unintentional dictation errors.   Lucrezia Starch, MD 08/19/21 1450

## 2021-08-19 NOTE — Assessment & Plan Note (Addendum)
Patient oriented to self only, at baseline.  Here, he initially was mostly babbling incoherently.   See above re: weight loss.  He appears to be nearing end stage dementia.    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  Here, he initially did not respond to benzodiazepines or pain medication, and so received low dose Haldol, to which he does not appear to have had excessive sensitivity - Continue Depakote, Celexa - Stop Haldol - Start Seroquel BID - Ativan and morphine PRN for pain or anxiety

## 2021-08-19 NOTE — Assessment & Plan Note (Addendum)
Hgb stable, no clinical bleeding 

## 2021-08-19 NOTE — Assessment & Plan Note (Addendum)
Patient had cervical spine imaging for the first time this admission, which showed canal narrowing at C3-4 on initial CT scan.  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    Neurosurgery consulted.  This spinal stenosis may be contributing to his falls, but given his advanced dementia, poor nutritional status, recurrent renal failure, nonadherence to appointments, and failure to thrive, spinal surgery would incur tremendous pain and medical burden, with the outcome that he would likely still have recurrent AKI due to his dementia and poor oral intake.

## 2021-08-19 NOTE — ED Notes (Signed)
[facility] RN aware of transfer to [facility].

## 2021-08-19 NOTE — H&P (Addendum)
History and Physical    Patient: Dillon Schultz ALP:[id number] DOB: 05-02-1946 DOA: 08/19/2021 DOS: the patient was seen and examined on 08/19/2021 PCP: [name], MD  Patient coming from: Home via EMS  Chief Complaint:  Chief Complaint  Patient presents with   Fall   Weakness    HPI: Dillon Schultz is a 76 y.o. male with medical history significant of Dementia with behavioral disturbances, hypertension, and functional constipation who presents due to reports of weakness and falls.  History is obtained from the patient as well as his son over the phone.  He had just recently been hospitalized 2/17-2/23 with acute kidney injury secondary to dehydration with syncope and collapse found to have orthostatic hypotension also thought likely related to dehydration.  Patient was found to have concern for adrenal insufficiency and started on low-dose prednisone.  It was recommended for him to go to a skilled nursing facility, but family declined and patient went home with home health services.  After getting home family notes that they had not been able to pick up the new prescriptions as of yet.  Him and his wife have been married for over 83 years and patient reports wanting to die at home and never wanted to be put in a facility as the reason why the had declined nursing facility option as well as his wife would not have wanted this.  Patient admits that he has no appetite.  He complains of bilateral leg pain and notes that they feel tight.  His son notes that his father's memory waxes and wanes in regards to being alert and oriented.  His son lives at home and tries to help assist taking care of him, but has weakness on one side of side and can only do so much without putting himself at risk for falls as well.  The patient has been complaining of dizziness and they have been trying to get him to eat but he just was not eating very much.  He had fallen several times and likely sustained injury to his right  chin with one of the falls.  No significant trauma to his head to their knowledge.   Upon admission into the emergency patient was noted to be afebrile with stable vital signs. Labs significant for hemoglobin 10.6, BUN 33,  creatinine 2.61, and Depakote level 11.  X-rays of the chest and pelvis did not note any acute abnormalities.  Influenza and COVID-19 screening was negative.  CT scans of the head and cervical spine noted possible cord compression at C3-C4.  Case was discussed with neurosurgery who recommended obtaining MRI of the cervical spine.  Review of Systems: As mentioned in the history of present illness. All other systems reviewed and are negative. 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 His smoking use included cigarettes. He has never used smokeless tobacco. He reports that he does not drink alcohol and does not use drugs.  Allergies  Allergen Reactions   Metformin Shortness Of Breath    Made him feel "crazy"    Glucophage [Metformin Hcl]     Made him feel "crazy"     History reviewed. No pertinent family history.  Prior to Admission medications   Medication Sig Start Date End Date Taking? Authorizing Provider  amLODipine (NORVASC) 10 MG tablet Take 10 mg by mouth daily. 08/10/21  Yes [provider]  aspirin 81 MG chewable tablet Chew 81 mg by mouth daily. 07/18/21  Yes [provider]  citalopram (CELEXA) 40 MG tablet Take 40 mg by mouth daily. 03/27/21  Yes [provider]  divalproex (DEPAKOTE) 125 MG DR tablet Take 1 tablet (125 mg total) by mouth every 12 (twelve) hours. 07/11/21  Yes Fritzi Mandes, MD  gabapentin (NEURONTIN) 100 MG capsule Take 100 mg by mouth 3 (three) times daily. 07/18/21  Yes [provider]  hydrALAZINE (APRESOLINE) 25 MG tablet Take 1 tablet (25 mg total) by mouth 2 (two) times daily. 07/11/21  Yes Fritzi Mandes, MD  megestrol (MEGACE) 400 MG/10ML suspension Take 5 mLs (200 mg total) by mouth daily. 08/17/21  Yes Sharen Hones, MD  midodrine (PROAMATINE) 5 MG tablet Take 1 tablet (5 mg total) by mouth 3 (three) times daily with meals. 08/05/21  Yes Nicole Kindred A, DO  mirtazapine (REMERON SOL-TAB) 15 MG disintegrating tablet Take 0.5 tablets (7.5 mg total) by mouth at bedtime. 08/16/21  Yes Sharen Hones, MD  Multiple Vitamin (MULTIVITAMIN WITH MINERALS) TABS tablet Take 1 tablet by mouth daily. 08/06/21  Yes Ezekiel Slocumb, DO  predniSONE (DELTASONE) 2.5 MG tablet Take 1 tablet (2.5 mg total) by mouth 2 (two) times daily with a meal. 08/17/21  Yes Sharen Hones, MD  senna-docusate (SENOKOT-S) 8.6-50 MG tablet Take 1 tablet by mouth 2 (two) times daily. 08/16/21  Yes Sharen Hones, MD  feeding supplement (ENSURE ENLIVE / ENSURE PLUS) LIQD Take 237 mLs by mouth 3 (three) times daily between meals. 08/05/21   Ezekiel Slocumb, DO  Nutritional Supplements (,FEEDING SUPPLEMENT, PROSOURCE PLUS) liquid Take 30 mLs by mouth 2 (two) times daily between meals. 08/05/21   Ezekiel Slocumb, DO  polyethylene glycol (MIRALAX / GLYCOLAX) 17 g packet Take 17 g by mouth daily. Patient not taking: Reported on 08/19/2021 08/17/21   Sharen Hones, MD  azelastine (ASTELIN) 137 MCG/SPRAY nasal spray Place 2 sprays into the nose 2 (two) times daily. Reported on  12/08/2015  12/22/20  [provider]  cetirizine (ZYRTEC) 10 MG tablet Take 10 mg by mouth daily. Reported on 12/08/2015  12/22/20  [provider]  furosemide (LASIX) 20 MG tablet Take 20 mg by mouth daily.  12/22/20  [provider]    Physical Exam: Vitals:   08/19/21 1136  BP: 118/77  Pulse: 68  Resp: 18  Temp: 97.8 F (36.6 C)  SpO2: 100%  Weight: 72.6 kg  Height: 5\' 8"  (1.727 m)    Constitutional: Elderly male who appears chronically ill Eyes: PERRL, lids and conjunctivae normal ENMT: Mucous membranes are dry.  Posterior pharynx clear of any exudate or lesions.  Neck: normal, supple, no masses.  No JVD. Respiratory: clear to auscultation bilaterally, no wheezing, no crackles. Normal respiratory effort. No accessory muscle use.  Cardiovascular:  Regular rate and rhythm, no murmurs / rubs / gallops.  Bilateral lower extremity swelling.  2+ pedal pulses. No carotid bruits.  Abdomen: no tenderness, no masses palpated.  Bowel sounds positive.  Musculoskeletal: no clubbing / cyanosis. No joint deformity upper and lower extremities. Good ROM, no contractures. Normal muscle tone.  Skin:  abrasion noted of the right shin Neurologic: CN 2-12 grossly intact.  Strength 4/5 in the bilateral extremities and 3-4/5 in lower extremities Psychiatric: Alert and oriented to person and place, but not time or circumstance.   Data Reviewed:   Normal sinus rhythm at 64 bpm with significant background alpha artifact  Assessment and Plan: * AKI (acute kidney injury) ([place])- (present on admission) Labs noted creatinine 2.61 with BUN 33. Creatinine has been 1.09 last on 2/23.  Given repeated falls question the possibility of rhabdomyolysis.  Patient had been given 1 L of lactated Ringer's. -Check CK level and urinalysis -Normal saline IV fluids at 125 mL/h -Daily monitoring of kidney function  Dementia with behavioral disturbance- (present on admission) Patient appears to  be end-stage dementia.  Not wanting to eat and drink on Megace to try and stimulate appetite.  For behavioral disturbances patient is on Depakote 125 mg q 12hrs. Depakote level was noted to be 11 -Delirium precautions -Palliative care consulted to assist with overall goals of care -Continue Depakote   Bilateral leg pain- (present on admission) Patient complains of bilateral lower extremity leg pain. Noted to have swelling on physical exam.   -Tramadol as needed for pain -Elevated legs -Check Doppler ultrasound of the bilateral lower extremities given limited mobility  Adrenal insufficiency ([facility]) Notedduring prior hospitalization - continue prednisone  Normocytic anemia- (present on admission) Appears stable.  Hemoglobin 10.6 g/dL which appears similar to prior 2 days ago. -Continue to monitor  Abnormal CT scan, cervical spine- (present on admission) Patient was noted to have concern for possible cord compression of C3-4 on initial CT scan of the cervical spine given patient's recurrent falls.  Neurosurgery had been consulted and recommended obtaining MRI of the cervical spine. -Follow-up MRI of the cervical spine -Appreciate neurosurgery consultative services, we will follow-up for further recommendation   Falls- (present on admission) Patient has several falls at home.  No acute injuries noted on initial imaging.  Possibly related with orthostatic hypotension. -Set bed alarm on -PT/OT to evaluate  Orthostatic hypotension- (present on admission) Currently blood pressures maintained.  Noted to have orthostatic hypotension during last hospitalization.   midodrine 3 times daily with meals as well as prednisone 2.5 mg twice daily as there was concern for adrenal insufficiency. -Check orthostatic vital signs -Continue midodrine  Malnutrition of moderate degree- (present on admission) Patient reports that he has no appetite.  Home medications include Megace.  Question if this is  progression of his dementia.  Previous imaging of his abdomen and pelvis did not notice any acute abnormalities in 06/2021. -Continue Megace and supplements in between meals       Advance Care Planning:   Code Status: Full Code   Consults: Neurosurgery  Family Communication:   Severity of Illness: The appropriate patient status for this patient is INPATIENT. Inpatient status is judged to be reasonable and necessary in order to provide the required intensity of service to ensure the patient's safety. The patient's presenting symptoms, physical exam findings, and initial radiographic and laboratory data in the context of their chronic comorbidities is felt to place them at high risk for further clinical deterioration. Furthermore, it is  not anticipated that the patient will be medically stable for discharge from the hospital within 2 midnights of admission.   * I certify that at the point of admission it is my clinical judgment that the patient will require inpatient hospital care spanning beyond 2 midnights from the point of admission due to high intensity of service, high risk for further deterioration and high frequency of surveillance required.*  Author: Norval Morton, MD 08/19/2021 3:11 PM  For on call review [email or web address].

## 2021-08-19 NOTE — Assessment & Plan Note (Addendum)
Patient complains of bilateral lower extremity leg pain. Noted to have swelling on physical exam.   -Tramadol as needed for pain -Elevated legs -Check Doppler ultrasound of the bilateral lower extremities given limited mobility

## 2021-08-19 NOTE — Assessment & Plan Note (Addendum)
Diagnosed a month ago due to low AM cortisol, started on midodrine and prednisone.  No hyperkalemia, hyperpigmentation or hyponatremia to suggest decompensated chronic adrenal insufficiency - Okay to continue prednisone for now - BP elevated today, stop midodrine

## 2021-08-20 DIAGNOSIS — Z91199 Patient's noncompliance with other medical treatment and regimen due to unspecified reason: Secondary | ICD-10-CM

## 2021-08-20 DIAGNOSIS — I5032 Chronic diastolic (congestive) heart failure: Secondary | ICD-10-CM

## 2021-08-20 DIAGNOSIS — M4802 Spinal stenosis, cervical region: Secondary | ICD-10-CM

## 2021-08-20 DIAGNOSIS — R001 Bradycardia, unspecified: Secondary | ICD-10-CM

## 2021-08-20 DIAGNOSIS — I82812 Embolism and thrombosis of superficial veins of left lower extremities: Secondary | ICD-10-CM

## 2021-08-20 DIAGNOSIS — R627 Adult failure to thrive: Secondary | ICD-10-CM

## 2021-08-20 LAB — URINALYSIS, COMPLETE (UACMP) WITH MICROSCOPIC
Bacteria, UA: NONE SEEN
Bilirubin Urine: NEGATIVE
Glucose, UA: NEGATIVE mg/dL
Hgb urine dipstick: NEGATIVE
Ketones, ur: NEGATIVE mg/dL
Leukocytes,Ua: NEGATIVE
Nitrite: NEGATIVE
Protein, ur: NEGATIVE mg/dL
Specific Gravity, Urine: 1.016 (ref 1.005–1.030)
Squamous Epithelial / HPF: NONE SEEN (ref 0–5)
pH: 5 (ref 5.0–8.0)

## 2021-08-20 LAB — CBC
HCT: 26.1 % — ABNORMAL LOW (ref 39.0–52.0)
Hemoglobin: 8.3 g/dL — ABNORMAL LOW (ref 13.0–17.0)
MCH: 29.1 pg (ref 26.0–34.0)
MCHC: 31.8 g/dL (ref 30.0–36.0)
MCV: 91.6 fL (ref 80.0–100.0)
Platelets: 158 10*3/uL (ref 150–400)
RBC: 2.85 MIL/uL — ABNORMAL LOW (ref 4.22–5.81)
RDW: 14.6 % (ref 11.5–15.5)
WBC: 6.1 10*3/uL (ref 4.0–10.5)
nRBC: 0 % (ref 0.0–0.2)

## 2021-08-20 LAB — BASIC METABOLIC PANEL
Anion gap: 6 (ref 5–15)
BUN: 30 mg/dL — ABNORMAL HIGH (ref 8–23)
CO2: 29 mmol/L (ref 22–32)
Calcium: 8.2 mg/dL — ABNORMAL LOW (ref 8.9–10.3)
Chloride: 105 mmol/L (ref 98–111)
Creatinine, Ser: 1.94 mg/dL — ABNORMAL HIGH (ref 0.61–1.24)
GFR, Estimated: 35 mL/min — ABNORMAL LOW (ref >60)
Glucose, Bld: 80 mg/dL (ref 70–99)
Potassium: 3.9 mmol/L (ref 3.5–5.1)
Sodium: 140 mmol/L (ref 135–145)

## 2021-08-20 LAB — MAGNESIUM: Magnesium: 1.8 mg/dL (ref 1.7–2.4)

## 2021-08-20 MED ORDER — SODIUM CHLORIDE 0.9 % IV BOLUS
1000.0000 mL | Freq: Once | INTRAVENOUS | Status: AC
  Administered 2021-08-20: 1000 mL via INTRAVENOUS

## 2021-08-20 MED ORDER — MAGNESIUM SULFATE 2 GM/50ML IV SOLN
2.0000 g | Freq: Once | INTRAVENOUS | Status: AC
  Administered 2021-08-20: 2 g via INTRAVENOUS
  Filled 2021-08-20: qty 50

## 2021-08-20 NOTE — Assessment & Plan Note (Addendum)
Blood pressure elevated -Hold hydralazine - Resume amlodipine

## 2021-08-20 NOTE — Assessment & Plan Note (Addendum)
Appeared dehydrated on admission, now euvolemic.

## 2021-08-20 NOTE — Assessment & Plan Note (Signed)
See above

## 2021-08-20 NOTE — Consult Note (Signed)
Referring Physician:  No referring provider defined for this encounter.  Primary Physician:  [name], MD  Chief Complaint:  multiple falls.  History of Present Illness: Dillon Schultz is a 76 y.o. male who presents with the chief complaint of multiple falls.  Patient has significant medical comorbidities, including advanced dementia, CHF and seizures. He was recently hospitalized with AKI, dehydration, poor PO intake.  Since his discharge, he has no prescriptions filled and has had several falls.     The symptoms are causing a significant impact on the patient's life.   Review of Systems:  A 10 point review of systems is negative, except for the pertinent positives and negatives detailed in the HPI.  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    Past Surgical History: Past Surgical History:  Procedure Laterality Date   COLONOSCOPY  2010   COLONOSCOPY WITH PROPOFOL N/A 12/16/2015   Procedure: COLONOSCOPY WITH PROPOFOL;  Surgeon: Lucilla Lame, MD;  Location: [facility];  Service: Endoscopy;  Laterality: N/A;   POLYPECTOMY  12/16/2015   Procedure: POLYPECTOMY;  Surgeon: Lucilla Lame, MD;  Location: [facility];  Service: Endoscopy;;    Problem List: Patient Active Problem List   Diagnosis Date Noted   Thrombosis of left saphenous vein 08/20/2021   Chronic diastolic CHF (congestive heart failure) ([place]) 08/20/2021   Failure to thrive in adult 08/20/2021   Nonadherence to medical treatment 08/20/2021   Cervical spinal stenosis 08/19/2021   Normocytic anemia 08/19/2021   Adrenal insufficiency ([place]) 08/19/2021   Constipation    Falls 08/11/2021   Orthostatic hypotension 08/03/2021   Malnutrition of moderate  degree 07/31/2021   1st degree AV block    AKI (acute kidney injury) ([place]) 07/21/2021   Dementia with behavioral disturbance 12/22/2020   Essential hypertension    Goals of care, counseling/discussion    Benign neoplasm of cecum    Benign neoplasm of ascending colon    Benign neoplasm of transverse colon    Benign neoplasm of sigmoid colon     Allergies: Allergies as of 08/19/2021 - Review Complete 08/19/2021  Allergen Reaction Noted   Metformin Shortness Of Breath 01/30/2010   Glucophage [metformin hcl]  02/04/2012    Medications: Medication Sig Start Date End Date Taking? Authorizing Provider  amLODipine (NORVASC) 10 MG tablet Take 10 mg by mouth daily. 08/10/21   Yes [provider]  aspirin 81 MG chewable tablet Chew 81 mg by mouth daily. 07/18/21   Yes [provider]  citalopram (CELEXA) 40 MG tablet Take 40 mg by mouth daily. 03/27/21   Yes [provider]  divalproex (DEPAKOTE) 125 MG DR tablet Take 1 tablet (125 mg total) by mouth every 12 (twelve) hours. 07/11/21   Yes Fritzi Mandes, MD  gabapentin (NEURONTIN) 100 MG capsule Take 100 mg by mouth 3 (three) times daily. 07/18/21   Yes [provider]  hydrALAZINE (APRESOLINE) 25 MG tablet Take 1 tablet (25 mg total) by mouth 2 (two) times daily. 07/11/21   Yes Fritzi Mandes, MD  megestrol (MEGACE) 400 MG/10ML suspension Take 5 mLs (200 mg total) by mouth daily. 08/17/21   Yes Zhang,  Dekui, MD  midodrine (PROAMATINE) 5 MG tablet Take 1 tablet (5 mg total) by mouth 3 (three) times daily with meals. 08/05/21   Yes Nicole Kindred A, DO  mirtazapine (REMERON SOL-TAB) 15 MG disintegrating tablet Take 0.5 tablets (7.5 mg total) by mouth at bedtime. 08/16/21   Yes Sharen Hones, MD  Multiple Vitamin (MULTIVITAMIN WITH MINERALS) TABS tablet Take 1 tablet by mouth daily. 08/06/21   Yes Ezekiel Slocumb, DO  predniSONE (DELTASONE) 2.5 MG tablet Take 1 tablet (2.5 mg total) by mouth 2 (two) times daily with a meal.  08/17/21   Yes Sharen Hones, MD  senna-docusate (SENOKOT-S) 8.6-50 MG tablet Take 1 tablet by mouth 2 (two) times daily. 08/16/21   Yes Sharen Hones, MD  feeding supplement (ENSURE ENLIVE / ENSURE PLUS) LIQD Take 237 mLs by mouth 3 (three) times daily between meals. 08/05/21     Ezekiel Slocumb, DO  Nutritional Supplements (,FEEDING SUPPLEMENT, PROSOURCE PLUS) liquid Take 30 mLs by mouth 2 (two) times daily between meals. 08/05/21     Ezekiel Slocumb, DO  polyethylene glycol (MIRALAX / GLYCOLAX) 17 g packet Take 17 g by mouth daily. Patient not taking: Reported on 08/19/2021 08/17/21     Sharen Hones, MD  azelastine (ASTELIN) 137 MCG/SPRAY nasal spray Place 2 sprays into the nose 2 (two) times daily. Reported on 12/08/2015   12/22/20   [provider]  cetirizine (ZYRTEC) 10 MG tablet Take 10 mg by mouth daily. Reported on 12/08/2015   12/22/20   [provider]  furosemide (LASIX) 20 MG tablet Take 20 mg by mouth daily.   12/22/20   [provider]    Social History: Social History   Tobacco Use   Smoking status: Former    Years: 5.00    Types: Cigarettes    Quit date: 06/26/1967    Years since quitting: 54.1   Smokeless tobacco: Never  Vaping Use   Vaping Use: Never used  Substance Use Topics   Alcohol use: Never   Drug use: Never    Family Medical History: History reviewed. No pertinent family history.  Physical Examination:   General: Patient is poorly nourished, and non cooperative   Head:  Patient actively closing eyes and not allowing for examination.   ENT:  Unable to test  Neck:   Unable to test  Respiratory: Patient is breathing without any difficulty.  Extremities: No edema.  Vascular: Palpable pulses.  Skin:   On exposed skin, there are no abnormal skin lesions.  NEUROLOGICAL:  General: Non cooperative Awake, but will not cooperate with examination.  Will localize to pain bilaterally, symmetric.    Strength: Unable to  test   Reflexes are 2+ and symmetric at the biceps, triceps, brachioradialis, patella and achilles.   Bilateral upper and lower extremity sensation is Unable to testintact.  Clonus is not present.  Toes are down-going.  Gait is Unable to test.   Hoffman's is absent.  Imaging: EXAM: CT CERVICAL SPINE WITHOUT CONTRAST   TECHNIQUE: Multidetector CT imaging of the cervical spine was performed without intravenous contrast. Multiplanar CT image reconstructions were also generated.   RADIATION DOSE REDUCTION: This exam was performed according to the departmental dose-optimization program which includes automated exposure control, adjustment of the mA and/or kV according to patient size and/or use of iterative reconstruction technique.   COMPARISON:  None.   FINDINGS: Alignment: No significant listhesis.   Skull base and vertebrae: Vertebral body heights are maintained. No acute fracture.  Soft tissues and spinal canal: No prevertebral fluid or swelling. No visible canal hematoma.   Disc levels: Multilevel degenerative changes are present including disc space narrowing, endplate osteophytes, and facet and uncovertebral hypertrophy. Left paracentral partially calcified disc extrusion extending slightly above disc level at C3-C4. Possible cord compression.   Upper chest: Included lung apices are clear.   Other: Calcified plaque at the common carotid bifurcations.   IMPRESSION: No acute cervical spine fracture.   Cervical spondylosis.  Possible cord compression at C3-C4.     Electronically Signed   By: Macy Mis M.D.   On: 08/19/2021 13:35  MRI CERVICAL SPINE WITHOUT CONTRAST   TECHNIQUE: Multiplanar, multisequence MR imaging of the cervical spine was performed. No intravenous contrast was administered.   COMPARISON:  CT 08/19/2021   FINDINGS: Technical Note: Despite efforts by the technologist and patient, motion artifact is present on today's exam and could  not be eliminated. This reduces exam sensitivity and specificity.   Alignment: Physiologic.   Vertebrae: No fracture, evidence of discitis, or bone lesion.   Cord: No obvious cord signal abnormality on motion degraded images. The cervical cord is compressed at the C3-4 level by posterior disc osteophyte complex (series 14, image 11).   Posterior Fossa, vertebral arteries, paraspinal tissues: No gross abnormality on limited exam.   Disc levels:   C2-C3: Small disc osteophyte complex with mild bilateral facet arthropathy. Mild canal and mild left foraminal stenosis.   C3-C4: Prominent central disc osteophyte complex results in deformation of the cervical cord and severe canal stenosis. Right greater than left facet and uncovertebral arthropathy contribute to severe right and moderate-severe left foraminal stenosis.   C4-C5: Small posterior disc osteophyte complex with bilateral facet and uncovertebral arthropathy. Findings result in moderate canal stenosis with severe left and moderate right foraminal stenosis.   C5-C6: Small disc osteophyte complex with bilateral facet and uncovertebral arthropathy. Findings result in mild-moderate canal stenosis with moderate bilateral foraminal stenosis.   C6-C7: Disc osteophyte complex with mild facet and uncovertebral arthropathy. Findings result in mild canal stenosis with moderate bilateral foraminal stenosis.   C7-T1: No significant disc protrusion, foraminal stenosis, or canal stenosis.   IMPRESSION: 1. Motion degraded exam. 2. 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 No obvious cord signal abnormality/edema on limited exam. 3. At least mild canal stenosis from C2-C3 through C6-C7. Multilevel high-grade bilateral foraminal stenosis, as detailed above.  I have personally reviewed the images  and agree with the above interpretation.  Assessment and Plan: Mr. Kolbe is a pleasant 76 y.o. male with advanced dementia and multiple falls.  I have discussed the condition with the patient's family, including showing the radiographs and discussing treatment options in layman's terms.    Thus, I have recommended the following:   1)  No acute surgical indications.  2)  At this time, patient has too many comorbidities to be a surgical candidate.  If these improve greatly, he may benefit from a surgical cervical decompression and/or fusion.   Thank you for involving me in the care of this patient. I will keep you apprised of the patient's progress.   This note was partially dictated using voice recognition software, so please excuse any errors that were not corrected.   Redge Gainer MD, PhD

## 2021-08-20 NOTE — Progress Notes (Signed)
OT Cancellation Note  Patient Details Name: Dillon Schultz MRN: [id number] DOB: [birth year]   Cancelled Treatment:    Reason Eval/Treat Not Completed: Fatigue/lethargy limiting ability to participate. Order received and chart reviewed. OT/PT attempted to see pt this AM for co-tx. Upon arrival to room, pt asleep in bed with HR 37-40bpm and RR 11. Attempted to wake pt via name called, light touch, cold washcloth applied to face, and sternal rub, however pt's eyes remained closed. OT unable to complete eval at this time d/t pt's fatigue limiting ability to participate. OT to re-attempt at later time/date as able.  Fredirick Maudlin, OTR/L [place]

## 2021-08-20 NOTE — Assessment & Plan Note (Addendum)
This was an incidental finding.  The patient has chronic lower extremity edema, hence the ultrasound, on which there was a suspicion of a nonocclusive left saphenofemoral thrombus.  Given his recurrent medication adherence and missed follow-up with appointments, as well as poor prognosis and failure to thrive, I do not think he is a safe candidate for anticoagulation.

## 2021-08-20 NOTE — Progress Notes (Signed)
Progress Note   Patient: Dillon Schultz NLZ:[id number] DOB: November 30, 1945 DOA: 08/19/2021     1 DOS: the patient was seen and examined on 08/20/2021       Brief hospital course: Dillon Schultz is a 76 y.o. M with advanced dementia, lives at home, HTN, and dCHF who presented with falls.  Recently hospitalized for 1 week with AKI, hypotension, dehydration, orthostasis, reported poor PO intake all corrected with fluids.  Started on mirtazapine, Megace, discharged 3 days PTA.  Since discharge, they have filled no prescriptions yet, he had fallen several times, so they brought him back.    In the ER, VSS.  Cr back to 2.6 (discharge Cr 1.1), depakote level low, CTH normal, CT c-spine with possible stenosis, COVID-.  Neurosurgery and palliative care consulted.        Assessment and Plan: * AKI (acute kidney injury) ([place])- (present on admission) Creatinine up to 2.6 on admission from baseline 1.1, down to 1.9 with fluids overnight.  CK normal.  This appears to be due to poor oral intake. - Continue IV fluids      Cervical spinal stenosis- (present on admission) Patient was noted to have concern for possible cord compression of C3-4 on initial CT scan of the cervical spine given patient's recurrent falls.  MRI confirms canal stenosis at the C3-C4 level, likely chronic, no obvious cord edema, but poor quality MRI due to motion. - Consult neurosurgery, appreciate recommendations   Thrombosis of left saphenous vein This was an incidental finding.  The patient has chronic lower extremity edema, hence the ultrasound, on which there was a suspicion of a nonocclusive left saphenofemoral thrombus.  Given his recurrent medication adherence and missed follow-up with appointments, as well as unclear goals of care and poor prognosis and failure to thrive, Ido not think he is a safe candidate for anticoagulation at this time. -Follow-up goals of care with family  Failure to thrive in adult - Consult  palliative care -Continue mirtazapine and Megace  Nonadherence to medical treatment See above  Chronic diastolic CHF (congestive heart failure) ([facility]) Appears dehydrated  Adrenal insufficiency ([facility]) - Continue prednisone and midodrine  Normocytic anemia- (present on admission) Hgb down with fluids, no clinical bleeding  Falls- (present on admission) These are due to failure to thrive, multifactorial from poor p.o. intake, malnutrition, deconditioning. -PT eval  Orthostatic hypotension- (present on admission) Noted to have orthostatic hypotension during last hospitalization.   midodrine 3 times daily with meals as well as prednisone 2.5 mg twice daily as there was concern for adrenal insufficiency. -Check orthostatic vital signs -Continue midodrine  Malnutrition of moderate degree- (present on admission) -Continue Megace and supplements in between meals  Essential hypertension Blood pressure low -Hold amlodipine, hydralazine - Continue low-dose aspirin  Dementia with behavioral disturbance- (present on admission) Patient oriented to self only - Resume Depakote, Celexa -Stop gabapentin   Goals of care, counseling/discussion During last hospitalization, it was recommended for him to go to SNF, but family declined and patient went home with [facility].  Patient and wife have been married for over 36 years and patient reports wanting to die at home and never wanted to be put in a facility as the reason why the had declined nursing facility option.   Bilateral leg pain-resolved as of 08/20/2021, (present on admission) Patient complains of bilateral lower extremity leg pain. Noted to have swelling on physical exam.   -Tramadol as needed for pain -Elevated legs -Check Doppler ultrasound of the bilateral lower extremities given  limited mobility        Subjective: Patient has no complaints.  Says he feels fine.  Asking for Benadryl as this helped him go to work yesterday.  He states  that he is at home.  No chest pain, dyspnea, abdominal pain.  No leg pain.  Has severe generalized weakness  Physical Exam: Vitals:   08/19/21 1804 08/19/21 2041 08/20/21 0513 08/20/21 0819  BP: 136/71 (!) 113/91 112/66 135/70  Pulse: (!) 53 60 (!) 53 (!) 42  Resp: 18 18 19 14   Temp:  98.4 F (36.9 C) 97.6 F (36.4 C) 97.7 F (36.5 C)  TempSrc:  Oral  Oral  SpO2: 100% 91% 98% 100%  Weight:      Height:       Elderly adult male, thenar wasting, temporal wasting, oropharynx tacky dry. Heart rate slow, no murmurs, rhythm regular, no significant peripheral edema Normal respiratory rate and rhythm, lungs clear without rales or wheezes Abdomen soft no tenderness palpation or guarding Upper extremity strength 4/5 but symmetric Lower extremity strength 3/5, symmetric, face symmetric, speech fluent but incoherent.  Oriented to self only states he is at home.  Babbles incoherently.  Follows commands appropriately      Data Reviewed: Electronic communication sent to neurosurgery. Labs and imaging are notable for creatinine at 1.9 from 2.6, electrolytes normal Complete blood count notable for hemoglobin down to 8.3 from 10 Ultrasound shows nonocclusive left saphenofemoral thrombus MRI spine shows severe stenosis at the C3-C4 level without cord edema Cortisol level is low Pelvic x-ray without fractures Chest x-ray clear Left teas normal CT head unremarkable Depakote level inappropriately low   Family Communication: Son by phone  Disposition: Status is: Inpatient Remains inpatient appropriate because: He has renal dysfunction, appears to be failing medical treatments.  We will need to discuss goals of care, I suspect that he is at the end of life, and family should transition to comfort measures.  Unclear to what extent rehabilitation will be successful.          Planned Discharge Destination:  To be determined        Author: Edwin Dada, MD 08/20/2021  2:08 PM  For on call review [email or web address].

## 2021-08-20 NOTE — Assessment & Plan Note (Addendum)
During last hospitalization, it was recommended for him to go to SNF, but family declined and patient went home with [facility].  Patient and wife have been married for over 73 years and patient reports wanting to die at home and never wanted to be put in a facility as the reason why the had declined nursing facility option.   Long family discussion on 2/27, medical course to date discussed, and end of life progression of dementia reviewed.  Family expressed understanding of the patient's end stage.  We have agreed not to escalate care and  allow a natural death.  At present, we will start medication to reduce impulsivity (and the risk of falls), so that the patient could potentially go home. Given the patient's poor PO intake, he may be a residential hospice candidate - Start Seroquel - IV ativan or morphine PRN - Consult Palliative care

## 2021-08-20 NOTE — Progress Notes (Signed)
PT Cancellation Note  Patient Details Name: Dillon Schultz MRN: [id number] DOB: 1945/10/02   Cancelled Treatment:    Reason Eval/Treat Not Completed: Fatigue/lethargy limiting ability to participate Order received and chart reviewed. PT/OT attempted to see patient this AM for co-tx. Upon arrival to room, patient asleep in bed with HR 37-40bpm and RR 11. Attempted to wake patient via name called, light touch, and sternal rub, however patient's eyes remained closed. PT  unable to complete eval at this time d/t patients fatigue limiting ability to participate. PT to re-attempt at later time/date as able.  Iva Boop, PT  08/20/21. 10:33 AM

## 2021-08-20 NOTE — Hospital Course (Addendum)
Dillon Schultz is a 76 y.o. M with advanced dementia, lives at home, HTN, and dCHF who presented with falls.  Recently hospitalized for 1 week with AKI, hypotension, dehydration, orthostasis, reported poor PO intake all corrected with fluids.  Started on mirtazapine, Megace, discharged 3 days PTA.  Since discharge, they have filled no prescriptions yet, he had fallen several times, so they brought him back.    In the ER, VSS.  Cr back to 2.6 (discharge Cr 1.1), depakote level low, CTH normal, CT c-spine with possible stenosis, COVID-.  Neurosurgery and palliative care consulted.

## 2021-08-20 NOTE — Progress Notes (Signed)
Pt will bradycardia high 30s to low 50. Dr Loleta Books notified via page. Came and assessed pt at bedside and bolus fluids ordered and infused. Later ua ordered and collected. Pt alert and awakes when called but speech in confused though words clear. Very poor appetite. Ate a few bites of oatmeal for breakfast and some milk. For lunch he only ate 2 fruit cups. I was able to list what is on tray and he Is able to tell what he wants. He pockets what  he doesn't want in the mouth or refuses to open mouth. Family called to talk to patient earlier but he wasn't able to reply to them other that acknowledging his name. Later own he was more awake and talked to son of the phone.

## 2021-08-20 NOTE — Assessment & Plan Note (Addendum)
Has lost 40 lbs since his last medical care in 2020 (with [facility]) - Consult palliative care - Continue mirtazapine and Megace

## 2021-08-20 NOTE — Assessment & Plan Note (Addendum)
Asymptomatic, BP normal.  TSH normal.  Evaluated by Cardiology earlier this year.  Event monitor initially considered, but deferred given patient's poor prognosis, poor mentation and poor MD follow up.    Unclear cause - Avoid nodal blocking agents

## 2021-08-21 DIAGNOSIS — I10 Essential (primary) hypertension: Secondary | ICD-10-CM

## 2021-08-21 DIAGNOSIS — R296 Repeated falls: Secondary | ICD-10-CM

## 2021-08-21 LAB — BASIC METABOLIC PANEL
Anion gap: 7 (ref 5–15)
BUN: 30 mg/dL — ABNORMAL HIGH (ref 8–23)
CO2: 29 mmol/L (ref 22–32)
Calcium: 8.1 mg/dL — ABNORMAL LOW (ref 8.9–10.3)
Chloride: 108 mmol/L (ref 98–111)
Creatinine, Ser: 1.5 mg/dL — ABNORMAL HIGH (ref 0.61–1.24)
GFR, Estimated: 48 mL/min — ABNORMAL LOW (ref >60)
Glucose, Bld: 59 mg/dL — ABNORMAL LOW (ref 70–99)
Potassium: 3.9 mmol/L (ref 3.5–5.1)
Sodium: 144 mmol/L (ref 135–145)

## 2021-08-21 LAB — TSH: TSH: 2.117 u[IU]/mL (ref 0.350–4.500)

## 2021-08-21 MED ORDER — LORAZEPAM 2 MG/ML PO CONC: 1.0000 mg | ORAL | Status: DC | PRN

## 2021-08-21 MED ORDER — HALOPERIDOL LACTATE 5 MG/ML IJ SOLN
0.5000 mg | INTRAMUSCULAR | Status: DC | PRN
Start: 2021-08-21 — End: 2021-08-23
  Administered 2021-08-21 – 2021-08-22 (×3): 0.5 mg via INTRAVENOUS
  Filled 2021-08-21 (×3): qty 1

## 2021-08-21 MED ORDER — HALOPERIDOL 0.5 MG PO TABS
0.5000 mg | ORAL_TABLET | ORAL | Status: DC | PRN
Start: 2021-08-21 — End: 2021-08-23
  Filled 2021-08-21: qty 1

## 2021-08-21 MED ORDER — MORPHINE SULFATE (CONCENTRATE) 10 MG/0.5ML PO SOLN: 5.0000 mg | ORAL | Status: DC | PRN

## 2021-08-21 MED ORDER — ADULT MULTIVITAMIN W/MINERALS CH
1.0000 | ORAL_TABLET | Freq: Every day | ORAL | Status: DC
  Administered 2021-08-22 – 2021-08-23 (×2): 1 via ORAL
  Filled 2021-08-21 (×2): qty 1

## 2021-08-21 MED ORDER — LORAZEPAM 2 MG/ML IJ SOLN
1.0000 mg | INTRAMUSCULAR | Status: DC | PRN
  Administered 2021-08-21: 1 mg via INTRAVENOUS
  Filled 2021-08-21: qty 1

## 2021-08-21 MED ORDER — HALOPERIDOL LACTATE 2 MG/ML PO CONC
0.5000 mg | ORAL | Status: DC | PRN
Start: 2021-08-21 — End: 2021-08-23
  Filled 2021-08-21: qty 0.3

## 2021-08-21 MED ORDER — HALOPERIDOL LACTATE 5 MG/ML IJ SOLN
0.5000 mg | Freq: Once | INTRAMUSCULAR | Status: AC
  Administered 2021-08-21: 0.5 mg via INTRAVENOUS
  Filled 2021-08-21: qty 1

## 2021-08-21 MED ORDER — QUETIAPINE FUMARATE 25 MG PO TABS: 25.0000 mg | ORAL_TABLET | Freq: Two times a day (BID) | ORAL | Status: DC

## 2021-08-21 MED ORDER — LORAZEPAM 1 MG PO TABS: 1.0000 mg | ORAL_TABLET | ORAL | Status: DC | PRN

## 2021-08-21 MED ORDER — ENSURE ENLIVE PO LIQD
237.0000 mL | Freq: Three times a day (TID) | ORAL | Status: DC
  Administered 2021-08-21: 237 mL via ORAL

## 2021-08-21 NOTE — Progress Notes (Signed)
[facility] Liaison note:  This is a pending outpatient-based Palliative Care patient. Will continue to follow for disposition.  Please call with any outpatient palliative questions or concerns.  Thank you, Dee Curry, LPN [facility] Hospital Liaison [phone] 

## 2021-08-21 NOTE — Progress Notes (Signed)
Progress Note   Patient: Dillon Schultz NFA:[id number] DOB: Oct 22, 1945 DOA: 08/19/2021     2 DOS: the patient was seen and examined on 08/21/2021       Brief hospital course: Dillon Schultz is a 76 y.o. M with advanced dementia, lives at home, HTN, and dCHF who presented with falls.  Recently hospitalized for 1 week with AKI, hypotension, dehydration, orthostasis, reported poor PO intake all corrected with fluids.  Started on mirtazapine, Megace, discharged 3 days PTA.  Since discharge, they have filled no prescriptions yet, he had fallen several times, so they brought him back.    In the ER, VSS.  Cr back to 2.6 (discharge Cr 1.1), depakote level low, CTH normal, CT c-spine with possible stenosis, COVID-.  Neurosurgery and palliative care consulted.        Assessment and Plan: * AKI (acute kidney injury) ([place])- (present on admission) Creatinine up to 2.6 on admission from baseline 1.1, down to 1.5 with fluids so far.    CK normal.  This appears to be due to poor oral intake.   This has been a recurrent pattern the last 2 months, this is the 5th admission for same. - Continue IV fluids      Cervical spinal stenosis- (present on admission) Patient had cervical spine imaging for the first time this admission, which showed canal narrowing at C3-4 on initial CT scan.  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    Neurosurgery consulted.  Given his poor adherence, advanced dementia, poor nutritional status, recurrent renal failure and failure to thrive, he is not a candidate for surgery. - I recommend Hospice   Thrombosis of left saphenous vein This was an incidental finding.  The patient has chronic lower extremity edema, hence the ultrasound, on which there was a suspicion of a nonocclusive left saphenofemoral thrombus.  Given his recurrent medication adherence and missed follow-up with appointments, as  well as unclear goals of care and poor prognosis and failure to thrive, I do not think he is a safe candidate for anticoagulation. -Follow-up goals of care with family  Failure to thrive in adult Has lost 40 lbs since his last medical care in 2020 (with [facility]) - Consult palliative care -Continue mirtazapine and Megace  Nonadherence to medical treatment See above  Sinus bradycardia Asymptomatic, BP normal.  TSH normal.  Evaluated by Cardiology earlier this year.  Event monitor initially considered, but deferred given patient's poor prognosis, poor mentation and poor MD follow up.    Unclear cause - Avoid nodal blocking agents   Chronic diastolic CHF (congestive heart failure) ([place]) Appeared dehydrated on admission, now euvolemic.  Adrenal insufficiency ([place]) Diagnosed a month ago due to low AM cortisol, started on midodrine and prednisone.  No hyperkalemia, hyperpigmentation or hyponatremia to suggest decompensated chronic adrenal insufficiency - Okay to continue prednisone and midodrine  Normocytic anemia- (present on admission) Hgb stable, no clinical bleeding  Falls- (present on admission) These are due to failure to thrive, multifactorial from poor p.o. intake, malnutrition, deconditioning, bradycardia and orthostasis.  To the extent he has myelopathy from cervical stenosis, this is terminal. -PT eval  Orthostatic hypotension- (present on admission) See above  Malnutrition of moderate degree- (present on admission) -Continue Megace and supplements in between meals  Essential hypertension Blood pressure low -Hold amlodipine, hydralazine - Continue low-dose aspirin  Dementia with behavioral disturbance- (present on admission) Patient oriented to self only,  mostly babbles incoherently.  THis is his recent basleine.  See above re: weight loss.  He appears to be nearing end stage dementia.    Suspect his falls are in part related to orthostasis and  bradycardia from Lewy body dementia. - Continue Depakote, Celexa - Start IM haldol PRN, will change to oral seroquel when able - Stopped gabapentin   Goals of care, counseling/discussion During last hospitalization, it was recommended for him to go to SNF, but family declined and patient went home with [facility].  Patient and wife have been married for over 6 years and patient reports wanting to die at home and never wanted to be put in a facility as the reason why the had declined nursing facility option.   Bilateral leg pain-resolved as of 08/20/2021, (present on admission) Patient complains of bilateral lower extremity leg pain. Noted to have swelling on physical exam.   -Tramadol as needed for pain -Elevated legs -Check Doppler ultrasound of the bilateral lower extremities given limited mobility        Subjective: Patient denies complaints, nursing report he is restless and disoriented but has no pain complaints, loss of consciousness, fever.  Physical Exam: Vitals:   08/20/21 1920 08/21/21 0417 08/21/21 0723 08/21/21 1505  BP: 139/67 139/71 (!) 160/79 (!) 147/72  Pulse: 63 (!) 47 (!) 44 69  Resp: 18 16 18 18   Temp: 97.8 F (36.6 C) (!) 97.5 F (36.4 C) 98.9 F (37.2 C) 98.8 F (37.1 C)  TempSrc:   Oral Oral  SpO2: 95% 95% 100% 100%  Weight:      Height:       Elderly adult male, thenar wasting, temporal wasting, oropharynx tacky dry. Heart rate slow, no murmurs, rhythm regular, no significant peripheral edema Normal respiratory rate and rhythm, lungs clear without rales or wheezes Abdomen soft no tenderness palpation or guarding Upper extremity strength 4/5 but symmetric Lower extremity strength 3/5, symmetric, face symmetric, speech fluent but incoherent.  Oriented to self only states he is at home.  Babbles incoherently.  Follows commands appropriately      Data Reviewed: Discussed with neurosurgery Lab studies are notable for normal sodium and potassium,  creatinine down to 1.5, TSH normal   Family Communication: Son by phone  Disposition: Status is: Inpatient Remains inpatient appropriate because: He has renal dysfunction, appears to be failing medical treatments.  We will need to discuss goals of care, I suspect that he is at the end of life, and family should transition to comfort measures.  Unclear to what extent rehabilitation will be successful.  Family meeting planned this afternoon, based on goals of care, we will plan to either take the patient home, or enter residential hospice          Planned Discharge Destination:  To be determined       Author: Edwin Dada, MD 08/21/2021 3:15 PM  For on call review [email or web address].

## 2021-08-21 NOTE — Progress Notes (Signed)
Inserted PIV access by [facility]. Patient pulled out it right away. Patient was confused and pulled out PIV access earlier, patient's RN didn't inform it. At this time, applied Kerlix & mitts due to pulling out PIV. HS [employer]

## 2021-08-21 NOTE — Evaluation (Signed)
Physical Therapy Evaluation Patient Details Name: Dillon Schultz MRN: [id number] DOB: 1945-08-22 Today's Date: 08/21/2021  History of Present Illness  Pt is a 76 y/o M admitted on 08/19/21 after presenting with c/c of falls. Pt was recently hospitalized for 1 week with AKI, hypotension, dehydration, orthostasis, reported poor PO intake all corrected with fluids. 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 PMH: dementia, HTN, dCHF  Clinical Impression  Pt seen for PT evaluation with co-tx with OT for pt & therapists safety 2/2 pt's impaired cognition. Pt is only oriented to self & tangential in speech but able to be redirected. Pt requires supervision with use of bed rails to complete bed mobility & min assist for stand pivot & sit>stand transfers. Pt endorses feeling weak but reports improvement when standing with RW to perform peri hygiene. Pt does decline walking on this date. Pt would benefit from STR upon d/c to maximize independence with functional mobility & reduce fall risk prior to return home.        Recommendations for follow up therapy are one component of a multi-disciplinary discharge planning process, led by the attending physician.  Recommendations may be updated based on patient status, additional functional criteria and insurance authorization.  Follow Up Recommendations Skilled nursing-short term rehab (<3 hours/day)    Assistance Recommended at Discharge Frequent or constant Supervision/Assistance  Patient can return home with the following  Direct supervision/assist for medications management;Assistance with feeding;Assist for transportation;Help with stairs or ramp for entrance;Assistance with cooking/housework;Direct supervision/assist for financial management;A lot of help with  bathing/dressing/bathroom;A lot of help with walking and/or transfers    Equipment Recommendations Rolling walker (2 wheels)  Recommendations for Other Services       Functional Status Assessment Patient has had a recent decline in their functional status and demonstrates the ability to make significant improvements in function in a reasonable and predictable amount of time.     Precautions / Restrictions Precautions Precautions: Fall Restrictions Weight Bearing Restrictions: No      Mobility  Bed Mobility Overal bed mobility: Needs Assistance Bed Mobility: Supine to Sit     Supine to sit: Supervision, HOB elevated (use of bed rails)          Transfers Overall transfer level: Needs assistance   Transfers: Sit to/from Stand, Bed to chair/wheelchair/BSC Sit to Stand: Min assist (gentle cuing for hand placement, sequencing, to push to standing on chair armrests) Stand pivot transfers: Min assist (pt pushing to stand on bed rails/chair armrest; extra time to complete pivot to recliner)              Ambulation/Gait Ambulation/Gait assistance:  (pt declines)                Stairs            Wheelchair Mobility    Modified Rankin (Stroke Patients Only)       Balance Overall balance assessment: Needs assistance Sitting-balance support: No upper extremity supported, Feet unsupported Sitting balance-Leahy Scale: Fair Sitting balance - Comments: requires only SUPERVISON for static sitting balance at EOB   Standing balance support: Single extremity supported, During functional activity Standing balance-Leahy Scale: Poor Standing balance comment: Requires MIN A for steadying during peri-care  Pertinent Vitals/Pain Pain Assessment Pain Assessment: No/denies pain    Home Living Family/patient expects to be discharged to:: Private residence Living Arrangements: Spouse/significant other;Other relatives Available  Help at Discharge: Family   Home Access: Stairs to enter   Entrance Stairs-Number of Steps: 2   Home Layout: One level   Additional Comments: pt is poor historian, all home living obtained from previous chart review    Prior Function Prior Level of Function : Patient poor historian/Family not available             Mobility Comments: no family present to report       Hand Dominance        Extremity/Trunk Assessment   Upper Extremity Assessment Upper Extremity Assessment: Generalized weakness;Difficult to assess due to impaired cognition    Lower Extremity Assessment Lower Extremity Assessment: Generalized weakness;Difficult to assess due to impaired cognition    Cervical / Trunk Assessment Cervical / Trunk Assessment: Kyphotic  Communication   Communication: No difficulties  Cognition Arousal/Alertness: Awake/alert Behavior During Therapy: WFL for tasks assessed/performed Overall Cognitive Status: No family/caregiver present to determine baseline cognitive functioning                                 General Comments: very tangential, however re-directable. Only oriented to self.        General Comments      Exercises     Assessment/Plan    PT Assessment Patient needs continued PT services  PT Problem List Decreased strength;Decreased activity tolerance;Decreased balance;Decreased mobility;Pain;Decreased safety awareness;Decreased cognition;Decreased knowledge of use of DME       PT Treatment Interventions DME instruction;Therapeutic exercise;Gait training;Balance training;Neuromuscular re-education;Stair training;Functional mobility training;Therapeutic activities;Patient/family education;Manual techniques;Cognitive remediation;Modalities    PT Goals (Current goals can be found in the Care Plan section)  Acute Rehab PT Goals Patient Stated Goal: unable to state PT Goal Formulation: Patient unable to participate in goal setting Time For  Goal Achievement: 09/04/21 Potential to Achieve Goals: Fair    Frequency Min 2X/week     Co-evaluation PT/OT/SLP Co-Evaluation/Treatment: Yes Reason for Co-Treatment: Necessary to address cognition/behavior during functional activity;For patient/therapist safety PT goals addressed during session: Mobility/safety with mobility;Balance OT goals addressed during session: ADL's and self-care       AM-PAC PT "6 Clicks" Mobility  Outcome Measure Help needed turning from your back to your side while in a flat bed without using bedrails?: A Little Help needed moving from lying on your back to sitting on the side of a flat bed without using bedrails?: A Little Help needed moving to and from a bed to a chair (including a wheelchair)?: A Little Help needed standing up from a chair using your arms (e.g., wheelchair or bedside chair)?: A Little Help needed to walk in hospital room?: A Little Help needed climbing 3-5 steps with a railing? : A Lot 6 Click Score: 17    End of Session   Activity Tolerance: Patient tolerated treatment well Patient left: in chair;with chair alarm set;with call bell/phone within reach Nurse Communication: Mobility status PT Visit Diagnosis: Difficulty in walking, not elsewhere classified (R26.2);Muscle weakness (generalized) (M62.81);Unsteadiness on feet (R26.81)    Time: 3151-7616 PT Time Calculation (min) (ACUTE ONLY): 22 min   Charges:   PT Evaluation $PT Eval Moderate Complexity: Garfield, PT, DPT 08/21/21, 12:52 PM   Waunita Schooner 08/21/2021, 12:51  PM

## 2021-08-21 NOTE — TOC Initial Note (Signed)
Transition of Care [facility]) - Initial/Assessment Note    Patient Details  Name: Dillon Schultz MRN: [id number] Date of Birth: 1945/11/23  [facility]) CM/SW Contact:    Beverly Sessions, RN Phone Number: 08/21/2021, 9:04 AM  Clinical Narrative:                 Patient with extreme risk assessment  Patient was assessed by [facility] 2/20 see note from that admission below "Spoke with the Hulan Fess on the phone Their plan is to take the patient home,  He lives with his wife and son He has a rolling walker and 3 in 1 The son asked about obtaining POA, I explained that the patient would have to be alert and oriented,  He stated understanding,  He stated that he wants to keep his dad a Full code but wants to make sure he is comfortable Outpatient palliative is to follow at DC"   Home health services were not able to be arranged previous admission. Patient required EMS at discharge  PT OT pending        Patient Goals and CMS Choice        Expected Discharge Plan and Services                                                Prior Living Arrangements/Services                       Activities of Daily Living Home Assistive Devices/Equipment: None ADL Screening (condition at time of admission) Patient's cognitive ability adequate to safely complete daily activities?: No Is the patient deaf or have difficulty hearing?: Yes Does the patient have difficulty seeing, even when wearing glasses/contacts?: Yes Does the patient have difficulty concentrating, remembering, or making decisions?: Yes Patient able to express need for assistance with ADLs?: Yes Does the patient have difficulty dressing or bathing?: Yes Independently performs ADLs?: No Communication: Independent Dressing (OT): Needs assistance Is this a change from baseline?: Pre-admission baseline Grooming: Needs assistance Is this a change from baseline?: Pre-admission baseline Feeding:  Independent Is this a change from baseline?: Pre-admission baseline Bathing: Needs assistance Is this a change from baseline?: Pre-admission baseline Toileting: Needs assistance Is this a change from baseline?: Pre-admission baseline In/Out Bed: Needs assistance Is this a change from baseline?: Pre-admission baseline Walks in Home: Needs assistance Is this a change from baseline?: Pre-admission baseline Does the patient have difficulty walking or climbing stairs?: Yes Weakness of Legs: Both Weakness of Arms/Hands: None  Permission Sought/Granted                  Emotional Assessment              Admission diagnosis:  Dehydration [E86.0] Leg pain, bilateral [M79.604, M79.605] AKI (acute kidney injury) ([place]) [N17.9] Recurrent falls [R29.6] Patient Active Problem List   Diagnosis Date Noted   Thrombosis of left saphenous vein 08/20/2021   Chronic diastolic CHF (congestive heart failure) ([place]) 08/20/2021   Failure to thrive in adult 08/20/2021   Nonadherence to medical treatment 08/20/2021   Sinus bradycardia 08/20/2021   Cervical spinal stenosis 08/19/2021   Normocytic anemia 08/19/2021   Adrenal insufficiency ([place]) 08/19/2021   Constipation    Falls 08/11/2021   Orthostatic hypotension 08/03/2021   Malnutrition of moderate degree 07/31/2021   1st  degree AV block    AKI (acute kidney injury) ([place]) 07/21/2021   Dementia with behavioral disturbance 12/22/2020   Essential hypertension    Goals of care, counseling/discussion    Benign neoplasm of cecum    Benign neoplasm of ascending colon    Benign neoplasm of transverse colon    Benign neoplasm of sigmoid colon    PCP:  [name], MD Pharmacy:   [facility], [address] [place] [address] Phone: [phone] Fax: [phone]     Social Determinants of Health (SDOH) Interventions    Readmission Risk Interventions Readmission Risk Prevention Plan 08/21/2021   Medication Review (RN Care Manager) Complete  SW Recovery Care/Counseling Consult Complete  Palliative Care Screening Complete  Some recent data might be hidden

## 2021-08-21 NOTE — TOC Initial Note (Signed)
Transition of Care [facility]) - Initial/Assessment Note    Patient Details  Name: Dillon Schultz MRN: [id number] Date of Birth: 02-16-1946  Transition of Care [facility]) CM/SW Contact:    Beverly Sessions, RN Phone Number: 08/21/2021, 4:36 PM  Clinical Narrative:                 MD and palliative currently attempting to have goals of care conversation with son.  PT recommending SNF.  Will follow up after goals of care determined.         Patient Goals and CMS Choice        Expected Discharge Plan and Services                                                Prior Living Arrangements/Services                       Activities of Daily Living Home Assistive Devices/Equipment: None ADL Screening (condition at time of admission) Patient's cognitive ability adequate to safely complete daily activities?: No Is the patient deaf or have difficulty hearing?: Yes Does the patient have difficulty seeing, even when wearing glasses/contacts?: Yes Does the patient have difficulty concentrating, remembering, or making decisions?: Yes Patient able to express need for assistance with ADLs?: Yes Does the patient have difficulty dressing or bathing?: Yes Independently performs ADLs?: No Communication: Independent Dressing (OT): Needs assistance Is this a change from baseline?: Pre-admission baseline Grooming: Needs assistance Is this a change from baseline?: Pre-admission baseline Feeding: Independent Is this a change from baseline?: Pre-admission baseline Bathing: Needs assistance Is this a change from baseline?: Pre-admission baseline Toileting: Needs assistance Is this a change from baseline?: Pre-admission baseline In/Out Bed: Needs assistance Is this a change from baseline?: Pre-admission baseline Walks in Home: Needs assistance Is this a change from baseline?: Pre-admission baseline Does the patient have difficulty walking or climbing stairs?: Yes Weakness of Legs:  Both Weakness of Arms/Hands: None  Permission Sought/Granted                  Emotional Assessment              Admission diagnosis:  Dehydration [E86.0] Leg pain, bilateral [M79.604, M79.605] AKI (acute kidney injury) ([place]) [N17.9] Recurrent falls [R29.6] Patient Active Problem List   Diagnosis Date Noted   Thrombosis of left saphenous vein 08/20/2021   Chronic diastolic CHF (congestive heart failure) ([place]) 08/20/2021   Failure to thrive in adult 08/20/2021   Nonadherence to medical treatment 08/20/2021   Sinus bradycardia 08/20/2021   Cervical spinal stenosis 08/19/2021   Normocytic anemia 08/19/2021   Adrenal insufficiency ([place]) 08/19/2021   Constipation    Falls 08/11/2021   Orthostatic hypotension 08/03/2021   Malnutrition of moderate degree 07/31/2021   1st degree AV block    AKI (acute kidney injury) ([place]) 07/21/2021   Dementia with behavioral disturbance 12/22/2020   Essential hypertension    Goals of care, counseling/discussion    Benign neoplasm of cecum    Benign neoplasm of ascending colon    Benign neoplasm of transverse colon    Benign neoplasm of sigmoid colon    PCP:  [name], MD Pharmacy:   [facility], [address] [place] [address] Phone: [phone] Fax: [phone]  Social Determinants of Health (SDOH) Interventions    Readmission Risk Interventions Readmission Risk Prevention Plan 08/21/2021  Medication Review (RN Care Manager) Complete  SW Recovery Care/Counseling Consult Complete  Palliative Care Screening Complete  Some recent data might be hidden

## 2021-08-21 NOTE — Plan of Care (Signed)
  Problem: Clinical Measurements: Goal: Ability to maintain clinical measurements within normal limits will improve Outcome: Progressing   

## 2021-08-21 NOTE — Consult Note (Addendum)
Consultation Note Date: 08/21/2021   Patient Name: Dillon Schultz  DOB: 06/11/1946  MRN: [id number]  Age / Sex: 76 y.o., male  PCP: [name], MD Referring Physician: Edwin Dada, *  Reason for Consultation: Establishing goals of care  HPI/Patient Profile: 76 y.o. male  with past medical history of dementia, HTN, functional constipation, gout, HLD, CHF, first-degree heart block, and recent hospitalizations (this is his fifth hospitalization in the last 6 months) admitted on 08/19/2021 with not wanting to eat or drink, multiple falls, and bilateral leg pain.   Last hospitalization was 1 week ago when patient was treated for AKI, hypotension, dehydration, and orthostasis.  Patient lives at home with his wife of over 31 years and his son [name].  Clinical Assessment and Goals of Care: I have reviewed medical records including EPIC notes, labs and imaging, assessed the patient and then spoke with patient's son [name] over the phone to discuss diagnosis prognosis, [name], [name] wishes, disposition and options.  I introduced Palliative Medicine as specialized medical care for people living with serious illness. It focuses on providing relief from the symptoms and stress of a serious illness. The goal is to improve quality of life for both the patient and the family.  Prior to admission [name] shares patient was not doing well at home.  He shares he is always pulling and tugging at things and that it is nothing new for him to be doing that here at the hospital.  [name] asked if it would be possible to sedate him in order to be able to give him food and medication.  Discussed that sedating medications are given for anxiety and agitation and that it is not medically appropriate to put his father "to sleep" only to give him medications, hydration, and nutrition.  Discussed quality of life. Reviewed that dementia is a  chronic, nonreversible, and progressive disease.  Attempted to discuss goals of care.  [name] shared he is planning to meet with the doctor today at 4 PM and can discuss goals with me after that.  [name] and I agreed to speak tomorrow at 9 AM in regards to goals of care.  Questions and concerns were addressed.  Afternoon update:  Dr. Loleta Books and I facetimed with patient's wife and son to discuss prognosis. Discussed dementia as a progressive, irreversible disease and how patient had lost weight, had recurrent falls, and was refusing PO intake - all signs of end stage dementia.  Reviewed FTT and cachexia vs starvation at end of life. Discussed DNR vs full code status. Son [name] and wife were appropriately tearful but understood and agreed with DNR.  Agreed on plan to keep patient comfortable and give medications for agitation and anxiety PRN. Patient is NOT full comfort measures but plan moving forward is to focus on his symptoms without escalation of care at this time.  Questions and concerns were addressed. PMT will continue to follow the patient throughout his hospitalization.   Primary Decision Maker NEXT OF KIN  Code Status/Advance Care Planning:  Full code  Prognosis:   Unable to determine  Discharge Planning: To Be Determined  Primary Diagnoses: Present on Admission:  AKI (acute kidney injury) ([place])  Dementia with behavioral disturbance  Falls  (Resolved) Dehydration  Cervical spinal stenosis  Normocytic anemia  Orthostatic hypotension  Malnutrition of moderate degree  (Resolved) Bilateral leg pain   Physical Exam Vitals and nursing note reviewed.  Constitutional:      Appearance: Normal appearance.  HENT:     Head: Normocephalic.     Mouth/Throat:     Mouth: Mucous membranes are moist.  Eyes:     Pupils: Pupils are equal, round, and reactive to light.  Cardiovascular:     Rate and Rhythm: Normal rate.     Pulses: Normal pulses.  Pulmonary:     Effort:  Pulmonary effort is normal.  Abdominal:     Palpations: Abdomen is soft.  Musculoskeletal:     Comments: Generalized weakness, mitten in place  Skin:    General: Skin is dry.  Neurological:     Mental Status: He is alert.     Comments: nonverbal    Palliative Assessment/Data: 30%     I discussed this patient's plan of care with patient, patient's son [name].  Thank you for this consult. Palliative medicine will continue to follow and assist holistically.   Time Total: 130 minutes Greater than 50%  of this time was spent counseling and coordinating care related to the above assessment and plan.  Signed by: Jordan Hawks, DNP, FNP-BC Palliative Medicine    Please contact Palliative Medicine Team phone at [phone] for questions and concerns.  For individual provider: See Shea Evans

## 2021-08-21 NOTE — Progress Notes (Addendum)
Initial Nutrition Assessment ° °DOCUMENTATION CODES:  ° °Non-severe (moderate) malnutrition in context of chronic illness ° °INTERVENTION:  ° °Ensure Enlive po TID, each supplement provides 350 kcal and 20 grams of protein. ° °Magic cup TID with meals, each supplement provides 290 kcal and 9 grams of protein ° °MVI po daily  ° °Dysphagia 3 diet  ° °Pt at high refeed risk; recommend monitor potassium, magnesium and phosphorus labs daily until stable ° °NUTRITION DIAGNOSIS:  ° °Moderate Malnutrition related to chronic illness (dementia, CHF) as evidenced by moderate fat depletion, severe fat depletion, moderate muscle depletion, severe muscle depletion. ° °GOAL:  ° °Patient will meet greater than or equal to 90% of their needs ° °MONITOR:  ° °PO intake, Supplement acceptance, Labs, Weight trends, Skin, I & O's ° °REASON FOR ASSESSMENT:  ° °Malnutrition Screening Tool °  ° °ASSESSMENT:  ° °75 y/o male with h/o CHF, dementia, HLD, HTN, gout and volvulus who is admitted with frequent falls and FTT ° °Met with pt in room today. Pt is known to nutrition department from a recent previous admission. Pt is a poor historian r/t to dementia. Pt reports that he ate some of his food today. Pt's lunch tray was sitting on his side table with only bites taken from it. Pt's main concern today is trying to remove his mittens. Pt drinking Ensure during his last admission and reports that he enjoys this. Pt also reports that he loves ice cream. RD will add supplements and MVI to help pt meet his estimated needs. Pt is likely at refeed risk. Per chart, pt is currently up ~19lbs from his UBW; RD unsure if pt's admit weight is correct. Palliative care following.  ° °Medications reviewed and include: aspirin, celexa, heparin, megace, remeron, prednisone, senokot, NaCl @100ml/hr  ° °Labs reviewed: K 3.9 wnl, BUN 30(H), creat 1.50(H) °Mg 1.8 wnl- 2/26 °Hgb 8.3(L), Hct 26.1(L) ° °NUTRITION - FOCUSED PHYSICAL EXAM: ° °Flowsheet Row Most Recent  Value  °Orbital Region Mild depletion  °Upper Arm Region Severe depletion  °Thoracic and Lumbar Region Moderate depletion  °Buccal Region Mild depletion  °Temple Region Moderate depletion  °Clavicle Bone Region Moderate depletion  °Clavicle and Acromion Bone Region Moderate depletion  °Scapular Bone Region Moderate depletion  °Dorsal Hand Severe depletion  °Patellar Region Moderate depletion  °Anterior Thigh Region Moderate depletion  °Posterior Calf Region Moderate depletion  °Edema (RD Assessment) Moderate  °Hair Reviewed  °Eyes Reviewed  °Mouth Reviewed  °Skin Reviewed  °Nails Reviewed  ° °Diet Order:   °Diet Order   ° °       °  Diet regular Room service appropriate? Yes with Assist; Fluid consistency: Thin  Diet effective now       °  ° °  °  ° °  ° °EDUCATION NEEDS:  ° °No education needs have been identified at this time ° °Skin:  Skin Assessment: Reviewed RN Assessment ° °Last BM:  PTA ° °Height:  ° °Ht Readings from Last 1 Encounters:  °08/19/21 5' 8" (1.727 m)  ° ° °Weight:  ° °Wt Readings from Last 1 Encounters:  °08/19/21 72.6 kg  ° ° °Ideal Body Weight:  70 kg ° °BMI:  Body mass index is 24.33 kg/m². ° °Estimated Nutritional Needs:  ° °Kcal:  1900-2200kcal/day ° °Protein:  95-110g/day ° °Fluid:  1.8-2.1L/day ° °  MS, RD, LDN °Please refer to [facility] for RD and/or RD on-call/weekend/after hours pager ° °

## 2021-08-21 NOTE — Evaluation (Signed)
Occupational Therapy Evaluation Patient Details Name: Dillon Schultz MRN: [id number] DOB: February 22, 1946 Today's Date: 08/21/2021   History of Present Illness Pt is a 76 y/o M admitted on 08/19/21 after presenting with c/c of falls. Pt was recently hospitalized for 1 week with AKI, hypotension, dehydration, orthostasis, reported poor PO intake all corrected with fluids. 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 PMH: dementia, HTN, dCHF   Clinical Impression   Pt seen for OT/PT co-evaluation this date. Upon arrival to room, pt awake and seated upright in bed. Pt agreeable to eval. Pt only oriented to self and no family present to provide baseline cognition and PLOF. Home set-up information below obtained from previous hospital admission. Pt currently requires SUPERVISION for bed mobility, MOD A for seated LB dressing, MIN A for stand pivot transfer to recliner, and MIN A for sit>stand peri-care due to current functional impairments (See OT Problem List below). Pt would benefit from additional skilled OT services to maximize return to PLOF and minimize risk of future falls, injury, caregiver burden, and readmission. Upon discharge, recommend SNF (although family declined SNF recommendation during previous admission).    Recommendations for follow up therapy are one component of a multi-disciplinary discharge planning process, led by the attending physician.  Recommendations may be updated based on patient status, additional functional criteria and insurance authorization.   Follow Up Recommendations  Skilled nursing-short term rehab (<3 hours/day)    Assistance Recommended at Discharge Frequent or constant Supervision/Assistance  Patient can return home with the following A lot of help with walking and/or transfers;A  lot of help with bathing/dressing/bathroom;Assistance with feeding;Assistance with cooking/housework;Help with stairs or ramp for entrance    Functional Status Assessment  Patient has had a recent decline in their functional status and demonstrates the ability to make significant improvements in function in a reasonable and predictable amount of time.  Equipment Recommendations  BSC/3in1       Precautions / Restrictions Precautions Precautions: Fall Restrictions Weight Bearing Restrictions: No      Mobility Bed Mobility Overal bed mobility: Needs Assistance Bed Mobility: Supine to Sit     Supine to sit: Supervision, HOB elevated          Transfers Overall transfer level: Needs assistance Equipment used: Rolling walker (2 wheels) Transfers: Sit to/from Stand Sit to Stand: Min assist           General transfer comment: Required MIN A for steadying when upright and required verbal cues for safe hand placement with RW use      Balance Overall balance assessment: Needs assistance Sitting-balance support: No upper extremity supported, Feet unsupported Sitting balance-Leahy Scale: Fair Sitting balance - Comments: requires only SUPERVISON for static sitting balance at EOB   Standing balance support: Single extremity supported, During functional activity Standing balance-Leahy Scale: Poor Standing balance comment: Requires MIN A for steadying during peri-care                           ADL either performed or assessed with clinical judgement   ADL Overall ADL's : Needs assistance/impaired     Grooming: Wash/dry hands;Minimal assistance;Standing               Lower Body Dressing: Moderate assistance;Sitting/lateral leans Lower Body Dressing Details (indicate cue  type and reason): able to doff socks without assist, but required MOD A to don over toes and ankles     Toileting- Clothing Manipulation and Hygiene: Minimal assistance;Sit to/from  stand Toileting - Clothing Manipulation Details (indicate cue type and reason): Able to perform posterior peri-care, requiring only MIN A to ensure thoroughness             Vision Patient Visual Report: No change from baseline              Pertinent Vitals/Pain Pain Assessment Pain Assessment: No/denies pain        Extremity/Trunk Assessment Upper Extremity Assessment Upper Extremity Assessment: Generalized weakness;Difficult to assess due to impaired cognition   Lower Extremity Assessment Lower Extremity Assessment: Generalized weakness;Difficult to assess due to impaired cognition   Cervical / Trunk Assessment Cervical / Trunk Assessment: Kyphotic   Communication Communication Communication: No difficulties   Cognition Arousal/Alertness: Awake/alert Behavior During Therapy: WFL for tasks assessed/performed Overall Cognitive Status: No family/caregiver present to determine baseline cognitive functioning                                 General Comments: very tangential, however re-directable. Only oriented to self.                Home Living Family/patient expects to be discharged to:: Private residence Living Arrangements: Spouse/significant other;Other relatives Available Help at Discharge: Family   Home Access: Stairs to enter Entrance Stairs-Number of Steps: 2   Home Layout: One level                   Additional Comments: pt is poor historian, all home living obtained from previous chart review      Prior Functioning/Environment Prior Level of Function : Patient poor historian/Family not available             Mobility Comments: no family present to report          OT Problem List: Decreased strength;Decreased cognition;Decreased safety awareness;Decreased activity tolerance;Impaired balance (sitting and/or standing);Decreased knowledge of use of DME or AE      OT Treatment/Interventions: Self-care/ADL  training;Therapeutic exercise;Therapeutic activities;Cognitive remediation/compensation;DME and/or AE instruction;Balance training;Patient/family education    OT Goals(Current goals can be found in the care plan section) Acute Rehab OT Goals OT Goal Formulation: Patient unable to participate in goal setting Time For Goal Achievement: 09/04/21 Potential to Achieve Goals: Fair ADL Goals Pt Will Perform Grooming: with set-up;with supervision;sitting Pt Will Transfer to Toilet: with supervision;stand pivot transfer;bedside commode Pt Will Perform Toileting - Clothing Manipulation and hygiene: with supervision;sit to/from stand  OT Frequency: Min 2X/week    Co-evaluation PT/OT/SLP Co-Evaluation/Treatment: Yes Reason for Co-Treatment: Necessary to address cognition/behavior during functional activity;For patient/therapist safety PT goals addressed during session: Mobility/safety with mobility;Balance OT goals addressed during session: ADL's and self-care      AM-PAC OT "6 Clicks" Daily Activity     Outcome Measure Help from another person eating meals?: A Little Help from another person taking care of personal grooming?: A Little Help from another person toileting, which includes using toliet, bedpan, or urinal?: A Little Help from another person bathing (including washing, rinsing, drying)?: A Lot Help from another person to put on and taking off regular upper body clothing?: A Little Help from another person to put on and taking off regular lower body clothing?: A Lot 6 Click Score: 16   End of Session Equipment  Utilized During Treatment: Rolling walker (2 wheels) Nurse Communication: Mobility status  Activity Tolerance: Patient tolerated treatment well Patient left: in chair;with call bell/phone within reach;with chair alarm set  OT Visit Diagnosis: Other abnormalities of gait and mobility (R26.89);Other symptoms and signs involving cognitive function                Time:  0258-5277 OT Time Calculation (min): 20 min Charges:  OT General Charges $OT Visit: 1 Visit OT Evaluation $OT Eval Moderate Complexity: [place], OTR/L [place]

## 2021-08-22 ENCOUNTER — Other Ambulatory Visit: Payer: Self-pay | Admitting: Nurse Practitioner

## 2021-08-22 DIAGNOSIS — Z66 Do not resuscitate: Secondary | ICD-10-CM

## 2021-08-22 DIAGNOSIS — Z515 Encounter for palliative care: Secondary | ICD-10-CM

## 2021-08-22 DIAGNOSIS — Z7189 Other specified counseling: Secondary | ICD-10-CM

## 2021-08-22 DIAGNOSIS — R627 Adult failure to thrive: Secondary | ICD-10-CM

## 2021-08-22 DIAGNOSIS — W19XXXD Unspecified fall, subsequent encounter: Secondary | ICD-10-CM

## 2021-08-22 MED ORDER — AMLODIPINE BESYLATE 5 MG PO TABS
5.0000 mg | ORAL_TABLET | Freq: Every day | ORAL | Status: DC
  Administered 2021-08-22 – 2021-08-23 (×2): 5 mg via ORAL
  Filled 2021-08-22 (×2): qty 1

## 2021-08-22 MED ORDER — HYDRALAZINE HCL 20 MG/ML IJ SOLN
5.0000 mg | Freq: Once | INTRAMUSCULAR | Status: AC
  Administered 2021-08-22: 5 mg via INTRAVENOUS
  Filled 2021-08-22: qty 1

## 2021-08-22 MED ORDER — QUETIAPINE FUMARATE 25 MG PO TABS
50.0000 mg | ORAL_TABLET | Freq: Two times a day (BID) | ORAL | Status: DC
  Administered 2021-08-22 – 2021-08-23 (×2): 50 mg via ORAL
  Filled 2021-08-22 (×2): qty 2

## 2021-08-22 MED ORDER — HYDRALAZINE HCL 20 MG/ML IJ SOLN
10.0000 mg | Freq: Once | INTRAMUSCULAR | Status: AC
  Administered 2021-08-22: 10 mg via INTRAVENOUS
  Filled 2021-08-22: qty 1

## 2021-08-22 NOTE — Care Management Important Message (Signed)
Important Message  Patient Details  Name: Dillon Schultz MRN: [id number] Date of Birth: 1946/02/09   Medicare Important Message Given:  N/A - LOS <3 / Initial given by admissions     Dannette Barbara 08/22/2021, 8:29 AM

## 2021-08-22 NOTE — Progress Notes (Signed)
Palliative Care Progress Note, Assessment & Plan   Patient Name: Dillon Schultz       Date: 08/22/2021 DOB: [birth year]  Age: 76 y.o. MRN#: [id number] Attending Physician: Edwin Dada, * Primary Care Physician: [name], MD Admit Date: 08/19/2021  Reason for Consultation/Follow-up: Establishing goals of care  Subjective: Patient is lying in bed in no apparent distress.  He is asleep but easily arousable.  He is able to open his eyes and acknowledge my presence but remains nonverbal.  I do not think he is able to make his wishes known.  No family is at bedside.  HPI: 76 y.o. male  with past medical history of dementia, HTN, functional constipation, gout, HLD, CHF, first-degree heart block, and recent hospitalizations (this is his fifth hospitalization in the last 6 months) admitted on 08/19/2021 with not wanting to eat or drink, multiple falls, and bilateral leg pain. Patient continues to refuse PO.   Summary of counseling/coordination of care: After reviewing the patient's chart and assessing the patient at bedside, I spoke with patient's son Dillon Schultz over the phone.   Reviewed medications and plan of care remain focused on keeping patient comfortable and to minimize agitation, anxiety, and restlessness. GIven we are working on managing patient's symptoms, I discussed next steps and options for patient's disposition.   Discussed hospice at home, hospice inpatient unit, and skilled nursing facilities.  I attempted to elicit goals and wishes important to the patient.  Dillon Schultz shares he wants to honor his father wishes and never take him to a nursing home. Dillon Schultz is hesitant to have hospice at home (negative experience with other family member in past), but we reviewed that other options would be short  lives (HH/PT/OT) or would require placement in a facility (hospice home, SNF). Dillon Schultz only wants to bring his father home.   Discussed important of continuing these discussions with family members while keeping patient at center of decision making. He said he would discuss with family. I shared I would f/u with him tomorrow morning.  Therapeutic silence and active listening provided for Dillon Schultz to share his thoughts and emotions regarding his father's current health status.  He is appropriately tearful.  Questions and concerns were addressed.  Code Status: DNR  Prognosis: < 6 weeks  Discharge Planning: Home with Hospice  Recommendations/Plan: Family weighing options for discharge DNR remains  Care plan was discussed with patient, Dr. Loleta Books, [place] and nursing note reviewed.  Constitutional:      General: He is not in acute distress.    Appearance: He is not toxic-appearing.  HENT:     Head: Normocephalic and atraumatic.     Mouth/Throat:     Mouth: Mucous membranes are moist.  Cardiovascular:     Rate and Rhythm: Normal rate.     Pulses: Normal pulses.  Pulmonary:     Effort: Pulmonary effort is normal.  Abdominal:     Palpations: Abdomen is soft.  Musculoskeletal:     Comments: Generalized weakness  Skin:    General: Skin is warm and dry.  Neurological:     Comments: nonverbal            Palliative  Assessment/Data: 20%    Total Time 35 minutes  Greater than 50%  of this time was spent counseling and coordinating care related to the above assessment and plan.  Thank you for allowing the Palliative Medicine Team to assist in the care of this patient.  [place] Ilsa Iha, FNP-BC Palliative Medicine Team Team Phone # [phone]

## 2021-08-22 NOTE — Progress Notes (Signed)
Progress Note   Patient: Dillon Schultz ZSW:[id number] DOB: [birth year] DOA: 08/19/2021     3 DOS: the patient was seen and examined on 08/22/2021       Brief hospital course: Dillon Schultz is a 76 y.o. M with advanced dementia, lives at home, HTN, and dCHF who presented with falls.  Recently hospitalized for 1 week with AKI, hypotension, dehydration, orthostasis, reported poor PO intake all corrected with fluids.  Started on mirtazapine, Megace, discharged 3 days PTA.  Since discharge, they have filled no prescriptions yet, he had fallen several times, so they brought him back.    In the ER, VSS.  Cr back to 2.6 (discharge Cr 1.1), depakote level low, CTH normal, CT c-spine with possible stenosis, COVID-.  Neurosurgery and palliative care consulted.        Assessment and Plan: * AKI (acute kidney injury) ([place])- (present on admission) Recurrent AKI on admission. CK normal.  This appears to be due to poor oral intake.    Creatinine up to 2.6 on admission from baseline 1.1, down to 1.5 with fluids.  This has been a recurrent pattern the last 2 months, this is the 5th admission for same with same outcome (some improvement with IV fluids).      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    Neurosurgery consulted.  This spinal stenosis may be contributing to his falls, but given his advanced dementia, poor nutritional status, recurrent renal failure, nonadherence to appointments, and failure to thrive, spinal surgery would incur tremendous pain and medical burden, with the outcome that he would likely still have recurrent AKI due to his dementia and poor oral intake.    Thrombosis of left saphenous vein This was an incidental  finding.  The patient has chronic lower extremity edema, hence the ultrasound, on which there was a suspicion of a nonocclusive left saphenofemoral thrombus.  Given his recurrent medication adherence and missed follow-up with appointments, as well as poor prognosis and failure to thrive, I do not think he is a safe candidate for anticoagulation.   Failure to thrive in adult Has lost 40 lbs since his last medical care in 2020 (with [facility]) - Consult palliative care - Continue mirtazapine and Megace  Nonadherence to medical treatment See above  Sinus bradycardia Asymptomatic, BP normal.  TSH normal.  Evaluated by Cardiology earlier this year.  Event monitor initially considered, but deferred given patient's poor prognosis, poor mentation and poor MD follow up.    Unclear cause - Avoid nodal blocking agents   Chronic diastolic CHF (congestive heart failure) ([place]) Appeared dehydrated on admission, now euvolemic.  Adrenal insufficiency ([place]) Diagnosed a month ago due to low AM cortisol, started on midodrine and prednisone.  No hyperkalemia, hyperpigmentation or hyponatremia to suggest decompensated chronic adrenal insufficiency - Okay to continue prednisone for now - BP elevated today, stop midodrine  Normocytic anemia- (present on admission) Hgb stable, no clinical bleeding  Falls- (present on admission) See above  Orthostatic hypotension- (present on admission) See above  Malnutrition of moderate degree- (present on admission)    Essential hypertension Blood pressure elevated -Hold hydralazine - Resume amlodipine   Dementia with behavioral disturbance- (present on admission) Patient oriented to self only, at baseline.  Here, he  initially was mostly babbling incoherently.   See above re: weight loss.  He appears to be nearing end stage dementia.    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  Here, he initially did not respond to benzodiazepines or pain medication, and so received low dose Haldol, to which he does not appear to have had excessive sensitivity - Continue Depakote, Celexa - Stop Haldol - Start Seroquel BID - Ativan and morphine PRN for pain or anxiety    Goals of care, counseling/discussion During last hospitalization, it was recommended for him to go to SNF, but family declined and patient went home with [facility].  Patient and wife have been married for over 20 years and patient reports wanting to die at home and never wanted to be put in a facility as the reason why the had declined nursing facility option.   Long family discussion on 2/27, medical course to date discussed, and end of life progression of dementia reviewed.  Family expressed understanding of the patient's end stage.  We have agreed not to escalate care and  allow a natural death.  At present, we will start medication to reduce impulsivity (and the risk of falls), so that the patient could potentially go home. Given the patient's poor PO intake, he may be a residential hospice candidate - Start Seroquel - IV ativan or morphine PRN - Consult Palliative care   Bilateral leg pain-resolved as of 08/20/2021, (present on admission) Patient complains of bilateral lower extremity leg pain. Noted to have swelling on physical exam.   -Tramadol as needed for pain -Elevated legs -Check Doppler ultrasound of the bilateral lower extremities given limited mobility        Subjective: Patient much sleepier today.  No nursing complaints of fever, agitation, respiratory distress.  Physical Exam: Vitals:   08/21/21 1958 08/22/21 0444 08/22/21 0846 08/22/21 0847  BP: (!) 115/101 (!) 189/97 (!) 190/96 (!) 177/105  Pulse: 61 60 93 (!) 55  Resp: 18 17 16    Temp: 98.5 F (36.9 C) 97.7 F (36.5 C) 98.3 F (36.8 C)   TempSrc: Oral Axillary Oral   SpO2: 100% 100% (!) 83% 99%  Weight:      Height:        Elderly adult male, thenar wasting, temporal wasting, lips dry, sleeping Heart rate slow, no murmurs, rhythm regular, no significant peripheral edema Normal respiratory rate and rhythm, lungs clear without rales or wheezes Sluggish and does not follow commands.       Data Reviewed: Discussed with palliative care    Family Communication:    Disposition: Status is: Inpatient Remains inpatient appropriate because: The patient is at the end of life due to end-stage dementia, recurrent renal failure due to poor p.o. intake.  Reasonable efforts have been made to correct his p.o. intake, but it appears to be centered the dying process.  Family discussion yesterday with palliative care, and we will work towards mitigating the patient's impulsivity, if we can do this safely, the patient may be able to discharge to home with hospice versus residential hospice           Planned Discharge Destination:  To be determined       Author: Edwin Dada, MD 08/22/2021 1:15 PM  For on call review [email or web address].

## 2021-08-22 NOTE — TOC Progression Note (Signed)
Transition of Care [facility]) - Progression Note    Patient Details  Name: Dillon Schultz MRN: [id number] Date of Birth: 1946/05/03  [facility]) CM/SW Contact  Beverly Sessions, RN Phone Number: 08/22/2021, 2:41 PM  Clinical Narrative:     Spoke with son He confirms they are not interested in SNF or hospice home.  Their goal is to have the patient at home "for what time he has left" He state he does not have a preference of hospice agency.  Referral made to [facility] with Manufacturing engineer. Son denies any need for DME at discharge.    Will Need EMS transport at discharge     Expected Discharge Plan and Services                                                 Social Determinants of Health (SDOH) Interventions    Readmission Risk Interventions Readmission Risk Prevention Plan 08/21/2021  Medication Review (RN Care Manager) Complete  SW Recovery Care/Counseling Consult Complete  Palliative Care Screening Complete  Some recent data might be hidden

## 2021-08-22 NOTE — TOC Progression Note (Signed)
Transition of Care [facility]) - Progression Note    Patient Details  Name: Dillon Schultz MRN: [id number] Date of Birth: 11-11-1945  Transition of Care [facility]) CM/SW Contact  Beverly Sessions, RN Phone Number: 08/22/2021, 1:25 PM  Clinical Narrative:      Palliative care to follow up with family today for goals of care.  Likely home with hospice vs hospice home       Expected Discharge Plan and Services                                                 Social Determinants of Health (SDOH) Interventions    Readmission Risk Interventions Readmission Risk Prevention Plan 08/21/2021  Medication Review (RN Care Manager) Complete  SW Recovery Care/Counseling Consult Complete  Palliative Care Screening Complete  Some recent data might be hidden

## 2021-08-22 NOTE — Progress Notes (Signed)
[facility]) Hospital Liaison Note   Received request from Transitions of Care Manager, Colletta [place], for hospice services at home after discharge. Chart and patient information under review by [facility] physician. Hospice eligibility pending.   Spoke with son/David to initiate education related to hospice philosophy, services, and team approach to care. [name] verbalized understanding of information given. Per discussion, the plan is for patient to discharge home via [facility] once cleared to DC.    DME needs discussed. Patient has the following equipment in the home: [place] Patient requests the following equipment for delivery: none  Address verified and is correct in the chart. [name] is the family member to contact to arrange time of equipment delivery.    Please send signed and completed DNR home with patient/family. Please provide prescriptions at discharge as needed to ensure ongoing symptom management.    [facility] information and contact numbers given to family & above information shared with TOC.   Please call with any questions/concerns.    Thank you for the opportunity to participate in this patient's care.   Daphene Calamity, MSW [facility] Liaison  [phone]

## 2021-08-22 NOTE — Plan of Care (Signed)
  Problem: Activity: Goal: Risk for activity intolerance will decrease Outcome: Progressing   

## 2021-08-23 MED ORDER — LORAZEPAM 1 MG PO TABS
1.0000 mg | ORAL_TABLET | ORAL | 0 refills | Status: AC | PRN
Start: 2021-08-23 — End: ?

## 2021-08-23 MED ORDER — TRAMADOL HCL 50 MG PO TABS: 50.0000 mg | ORAL_TABLET | Freq: Four times a day (QID) | ORAL | 0 refills | Status: AC | PRN

## 2021-08-23 MED ORDER — PREDNISONE 2.5 MG PO TABS: 2.5000 mg | ORAL_TABLET | Freq: Two times a day (BID) | ORAL | 0 refills | Status: AC

## 2021-08-23 MED ORDER — QUETIAPINE FUMARATE 50 MG PO TABS: 50.0000 mg | ORAL_TABLET | Freq: Two times a day (BID) | ORAL | 0 refills | Status: AC

## 2021-08-23 NOTE — TOC Transition Note (Signed)
Transition of Care (TOC) - CM/SW Discharge Note ? ? ?Patient Details  ?Name: Dillon Schultz ?MRN: [id number] ?Date of Birth: 12-20-1945 ? ?Transition of Care (TOC) CM/SW Contact:  ?Beverly Sessions, RN ?Phone Number: ?08/23/2021, 1:25 PM ? ? ?Clinical Narrative:    ? ?Patient to discharge today.  ?Lorayne Bender with Manufacturing engineer aware. ? ?Son updated ?Confirmed o2 has been delivered to home ?EMS packet and signed DNR on chart ?EMS called.   ? ?  ?  ? ? ?Patient Goals and CMS Choice ?  ?  ?  ? ?Discharge Placement ?  ?           ?  ?  ?  ?  ? ?Discharge Plan and Services ?  ?  ?           ?  ?  ?  ?  ?  ?  ?  ?  ?  ?  ? ?Social Determinants of Health (SDOH) Interventions ?  ? ? ?Readmission Risk Interventions ?Readmission Risk Prevention Plan 08/21/2021  ?Medication Review Press photographer) Complete  ?SW Recovery Care/Counseling Consult Complete  ?Palliative Care Screening Complete  ?Some recent data might be hidden  ? ? ? ? ? ?

## 2021-08-23 NOTE — Discharge Summary (Signed)
Dillon Schultz JQB:[id number] DOB: [birth year] DOA: 08/19/2021  PCP: [name], MD  Admit date: 08/19/2021 Discharge date: 08/23/2021  Time spent: 35 minutes  Recommendations for Outpatient Follow-up:  Home with home hospice     Discharge Diagnoses:  Principal Problem:   AKI (acute kidney injury) ([place]) Active Problems:   Goals of care, counseling/discussion   Dementia with behavioral disturbance   Essential hypertension   Malnutrition of moderate degree   Orthostatic hypotension   Falls   Cervical spinal stenosis   Normocytic anemia   Adrenal insufficiency (HCC)   Thrombosis of left saphenous vein   Chronic diastolic CHF (congestive heart failure) (HCC)   Failure to thrive in adult   Nonadherence to medical treatment   Sinus bradycardia   Discharge Condition: stable  Diet recommendation: comfort feeds  Filed Weights   08/19/21 1136  Weight: 72.6 kg    History of present illness:  Dillon Schultz is a 76 y.o. male with medical history significant of Dementia with behavioral disturbances, hypertension, and functional constipation who presents due to reports of weakness and falls.  History is obtained from the patient as well as his son over the phone.  He had just recently been hospitalized 2/17-2/23 with acute kidney injury secondary to dehydration with syncope and collapse found to have orthostatic hypotension also thought likely related to dehydration.  Patient was found to have concern for adrenal insufficiency and started on low-dose prednisone.  It was recommended for him to go to a skilled nursing facility, but family declined and patient went home with home health services.  After getting home family notes that they had not been able to pick up the new prescriptions as of yet.  Him and his wife have been married for over 90 years and patient reports wanting to die at home and never wanted to be put in a facility as the reason why the had declined nursing facility option as  well as his wife would not have wanted this.  Patient admits that he has no appetite.  He complains of bilateral leg pain and notes that they feel tight.  His son notes that his father's memory waxes and wanes in regards to being alert and oriented.  His son lives at home and tries to help assist taking care of him, but has weakness on one side of side and can only do so much without putting himself at risk for falls as well.  The patient has been complaining of dizziness and they have been trying to get him to eat but he just was not eating very much.  He had fallen several times and likely sustained injury to his right chin with one of the falls.  No significant trauma to his head to their knowledge.   Upon admission into the emergency patient was noted to be afebrile with stable vital signs. Labs significant for hemoglobin 10.6, BUN 33,  creatinine 2.61, and Depakote level 11.  X-rays of the chest and pelvis did not note any acute abnormalities.  Influenza and COVID-19 screening was negative.  CT scans of the head and cervical spine noted possible cord compression at C3-C4.  Case was discussed with neurosurgery who recommended obtaining MRI of the cervical spine.  Hospital Course:  AKI (acute kidney injury) ([place])- (present on admission) Recurrent AKI on admission. CK normal.  This appears to be due to poor oral intake.     Creatinine up to 2.6 on admission from baseline 1.1, down to 1.5 with fluids.  This has been a recurrent pattern the last 2 months, this is the 5th admission for same with same outcome (some improvement with IV fluids).   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   Neurosurgery consulted.  This spinal stenosis may be  contributing to his falls, but given his advanced dementia, poor nutritional status, recurrent renal failure, nonadherence to appointments, and failure to thrive, spinal surgery would incur tremendous pain and medical burden, with the outcome that he would likely still have recurrent AKI due to his dementia and poor oral intake.   Thrombosis of left saphenous vein This was an incidental finding.  The patient has chronic lower extremity edema, hence the ultrasound, on which there was a suspicion of a nonocclusive left saphenofemoral thrombus.  Given his recurrent medication adherence and missed follow-up with appointments, as well as poor prognosis and failure to thrive, I do not think he is a safe candidate for anticoagulation.   Failure to thrive in adult Has lost 40 lbs since his last medical care in 2020 (with [facility])   Nonadherence to medical treatment See above   Sinus bradycardia Asymptomatic, BP normal.  TSH normal. Evaluated by Cardiology earlier this year.  Event monitor initially considered, but deferred given patient's poor prognosis, poor mentation and poor MD follow up.     Chronic diastolic CHF (congestive heart failure) (HCC) Appeared dehydrated on admission, now euvolemic.   Adrenal insufficiency ([place]) Diagnosed a month ago due to low AM cortisol, started on midodrine and prednisone.  No hyperkalemia, hyperpigmentation or hyponatremia to suggest decompensated chronic adrenal insufficiency - Okay to continue prednisone for now   Essential hypertension Blood pressure elevated  Hypoxic respiratory failure Likely 2/2 atelectasis. Requiring 2 L [place] O2 - d/c on thatc   Dementia with behavioral disturbance- (present on admission) Patient oriented to self only, at baseline.  Here, he initially was mostly babbling incoherently.   See above re: weight loss.  He appears to be nearing end stage dementia.   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  Here, he initially did not respond to benzodiazepines or pain medication, and so received low dose Haldol, to which he does not appear to have had excessive sensitivity - Continue Depakote, Celexa - Stop Haldol - Start Seroquel BID - Ativan and morphine PRN for pain or anxiety    Goals of care, counseling/discussion During last hospitalization, it was recommended for him to go to SNF, but family declined and patient went home with [facility].  Patient and wife have been married for over 44 years and patient reports wanting to die at home and never wanted to be put in a facility as the reason why the had declined nursing facility option.  Long family discussion on 2/27, medical course to date discussed, and end of life progression of dementia reviewed.  Family expressed understanding of the patient's end stage.  We have agreed not to escalate care and  allow a natural death. At present, we will start medication to reduce impulsivity (and the risk of falls), so that the patient could potentially go home. Given the patient's poor PO intake, he may be a residential hospice candidate - Start Seroquel - IV  ativan or morphine PRN - Consult Palliative care   Procedures: none  Consultations: Cardiology, neurosurgery  Discharge Exam: Vitals:   08/23/21 0755 08/23/21 1036  BP: (!) 186/84 (!) 140/93  Pulse: 61 72  Resp: 16   Temp: 99.4 F (37.4 C)   SpO2: 100% 100%    General: chronically ill appearing Cardiovascular: rrr, soft systolic murmur Respiratory: ctab save for rales at bases   Discharge Instructions   Discharge Instructions     Change dressing (specify)   Complete by: As directed    Dressing change: daily change, frequent repositioning   Diet - low sodium heart healthy   Complete by: As directed    Increase activity slowly   Complete by: As directed       Allergies as of 08/23/2021       Reactions   Metformin Shortness Of  Breath   Made him feel "crazy"    Glucophage [metformin Hcl]    Made him feel "crazy"         Medication List     STOP taking these medications    (feeding supplement) PROSource Plus liquid   amLODipine 10 MG tablet Commonly known as: NORVASC   aspirin 81 MG chewable tablet   feeding supplement Liqd   hydrALAZINE 25 MG tablet Commonly known as: APRESOLINE   megestrol 400 MG/10ML suspension Commonly known as: MEGACE   midodrine 5 MG tablet Commonly known as: PROAMATINE   multivitamin with minerals Tabs tablet       TAKE these medications    citalopram 40 MG tablet Commonly known as: CELEXA Take 40 mg by mouth daily.   divalproex 125 MG DR tablet Commonly known as: DEPAKOTE Take 1 tablet (125 mg total) by mouth every 12 (twelve) hours.   gabapentin 100 MG capsule Commonly known as: NEURONTIN Take 100 mg by mouth 3 (three) times daily.   LORazepam 1 MG tablet Commonly known as: ATIVAN Take 1 tablet (1 mg total) by mouth every 4 (four) hours as needed for anxiety.   mirtazapine 15 MG disintegrating tablet Commonly known as: REMERON SOL-TAB Take 0.5 tablets (7.5 mg total) by mouth at bedtime.   polyethylene glycol 17 g packet Commonly known as: MIRALAX / GLYCOLAX Take 17 g by mouth daily.   predniSONE 2.5 MG tablet Commonly known as: DELTASONE Take 1 tablet (2.5 mg total) by mouth 2 (two) times daily with a meal.   QUEtiapine 50 MG tablet Commonly known as: SEROQUEL Take 1 tablet (50 mg total) by mouth 2 (two) times daily.   senna-docusate 8.6-50 MG tablet Commonly known as: Senokot-S Take 1 tablet by mouth 2 (two) times daily.   traMADol 50 MG tablet Commonly known as: ULTRAM Take 1 tablet (50 mg total) by mouth every 6 (six) hours as needed for moderate pain.               Durable Medical Equipment  (From admission, onward)           Start     Ordered   Unscheduled  DME Oxygen  Once       Question Answer Comment  Length of  Need Lifetime   Liters per Minute 2   Frequency Continuous (stationary and portable oxygen unit needed)   Oxygen delivery system Gas      08/23/21 1039              Discharge Care Instructions  (From admission, onward)  Start     Ordered   08/23/21 0000  Change dressing (specify)       Comments: Dressing change: daily change, frequent repositioning   08/23/21 1039           Allergies  Allergen Reactions   Metformin Shortness Of Breath    Made him feel "crazy"    Glucophage [Metformin Hcl]     Made him feel "crazy"       The results of significant diagnostics from this hospitalization (including imaging, microbiology, ancillary and laboratory) are listed below for reference.    Significant Diagnostic Studies: DG Chest 2 View  Result Date: 08/11/2021 CLINICAL DATA:  Weakness. EXAM: CHEST - 2 VIEW COMPARISON:  July 30, 2021. FINDINGS: The heart size and mediastinal contours are within normal limits. Both lungs are clear. The visualized skeletal structures are unremarkable. IMPRESSION: No active cardiopulmonary disease. Electronically Signed   By: Marijo Conception M.D.   On: 08/11/2021 08:13   CT HEAD WO CONTRAST (5MM)  Result Date: 08/19/2021 CLINICAL DATA:  Head trauma, minor (Age >= 65y) EXAM: CT HEAD WITHOUT CONTRAST TECHNIQUE: Contiguous axial images were obtained from the base of the skull through the vertex without intravenous contrast. RADIATION DOSE REDUCTION: This exam was performed according to the departmental dose-optimization program which includes automated exposure control, adjustment of the mA and/or kV according to patient size and/or use of iterative reconstruction technique. COMPARISON:  07/30/2021 FINDINGS: Brain: There is no acute intracranial hemorrhage, mass effect, or edema. No new loss of gray-white differentiation. There is no extra-axial fluid collection. Ventricles and sulci are stable in size and configuration. Stable findings of  probable chronic microvascular ischemic changes in the cerebral white matter. Vascular: There is atherosclerotic calcification at the skull base. Skull: Calvarium is unremarkable. Sinuses/Orbits: No acute finding. Other: None. IMPRESSION: No evidence of acute intracranial injury. No significant change since recent prior study. Electronically Signed   By: Macy Mis M.D.   On: 08/19/2021 13:31   CT Head Wo Contrast  Result Date: 07/30/2021 CLINICAL DATA:  Altered mental status. Loss of consciousness. Fall. EXAM: CT HEAD WITHOUT CONTRAST TECHNIQUE: Contiguous axial images were obtained from the base of the skull through the vertex without intravenous contrast. RADIATION DOSE REDUCTION: This exam was performed according to the departmental dose-optimization program which includes automated exposure control, adjustment of the mA and/or kV according to patient size and/or use of iterative reconstruction technique. COMPARISON:  07/21/2021 FINDINGS: Brain: No evidence of acute infarction, hemorrhage, hydrocephalus, extra-axial collection or mass lesion/mass effect. There is mild diffuse low-attenuation within the subcortical and periventricular white matter compatible with chronic microvascular disease. Prominence of the sulci and the ventricles compatible with brain atrophy. Vascular: No hyperdense vessel or unexpected calcification. Skull: Normal. Negative for fracture or focal lesion. Sinuses/Orbits: No acute finding. Other: None. IMPRESSION: 1. No acute intracranial abnormalities. 2. Chronic small vessel ischemic disease and brain atrophy. Electronically Signed   By: Kerby Moors M.D.   On: 07/30/2021 15:08   CT Cervical Spine Wo Contrast  Result Date: 08/19/2021 CLINICAL DATA:  Neck trauma (Age >= 65y) EXAM: CT CERVICAL SPINE WITHOUT CONTRAST TECHNIQUE: Multidetector CT imaging of the cervical spine was performed without intravenous contrast. Multiplanar CT image reconstructions were also generated.  RADIATION DOSE REDUCTION: This exam was performed according to the departmental dose-optimization program which includes automated exposure control, adjustment of the mA and/or kV according to patient size and/or use of iterative reconstruction technique. COMPARISON:  None. FINDINGS: Alignment: No significant  listhesis. Skull base and vertebrae: Vertebral body heights are maintained. No acute fracture. Soft tissues and spinal canal: No prevertebral fluid or swelling. No visible canal hematoma. Disc levels: Multilevel degenerative changes are present including disc space narrowing, endplate osteophytes, and facet and uncovertebral hypertrophy. Left paracentral partially calcified disc extrusion extending slightly above disc level at C3-C4. Possible cord compression. Upper chest: Included lung apices are clear. Other: Calcified plaque at the common carotid bifurcations. IMPRESSION: No acute cervical spine fracture. Cervical spondylosis.  Possible cord compression at C3-C4. Electronically Signed   By: Macy Mis M.D.   On: 08/19/2021 13:35   MR BRAIN WO CONTRAST  Result Date: 07/30/2021 CLINICAL DATA:  Dizziness EXAM: MRI HEAD WITHOUT CONTRAST TECHNIQUE: Multiplanar, multiecho pulse sequences of the brain and surrounding structures were obtained without intravenous contrast. COMPARISON:  No prior MRI head, correlation is made with CT head 07/30/2021 FINDINGS: Brain: No restricted diffusion to suggest acute or subacute infarct. No acute hemorrhage, mass, mass effect, or midline shift. No hydrocephalus or extra-axial collection. Ventriculomegaly, likely secondary to advanced cerebral atrophy for age. Confluent T2 hyperintense signal in the periventricular white matter, likely the sequela of severe chronic small vessel ischemic disease. Vascular: Normal flow voids. Skull and upper cervical spine: Normal marrow signal. Sinuses/Orbits: Negative. Other: None. IMPRESSION: No acute intracranial process. Cerebral  atrophy, which appears advanced for age. Electronically Signed   By: Merilyn Baba M.D.   On: 07/30/2021 21:47   MR Cervical Spine Wo Contrast  Result Date: 08/19/2021 CLINICAL DATA:  Multiple falls.  Possible cord compression on CT EXAM: MRI CERVICAL SPINE WITHOUT CONTRAST TECHNIQUE: Multiplanar, multisequence MR imaging of the cervical spine was performed. No intravenous contrast was administered. COMPARISON:  CT 08/19/2021 FINDINGS: Technical Note: Despite efforts by the technologist and patient, motion artifact is present on today's exam and could not be eliminated. This reduces exam sensitivity and specificity. Alignment: Physiologic. Vertebrae: No fracture, evidence of discitis, or bone lesion. Cord: No obvious cord signal abnormality on motion degraded images. The cervical cord is compressed at the C3-4 level by posterior disc osteophyte complex (series 14, image 11). Posterior Fossa, vertebral arteries, paraspinal tissues: No gross abnormality on limited exam. Disc levels: C2-C3: Small disc osteophyte complex with mild bilateral facet arthropathy. Mild canal and mild left foraminal stenosis. C3-C4: Prominent central disc osteophyte complex results in deformation of the cervical cord and severe canal stenosis. Right greater than left facet and uncovertebral arthropathy contribute to severe right and moderate-severe left foraminal stenosis. C4-C5: Small posterior disc osteophyte complex with bilateral facet and uncovertebral arthropathy. Findings result in moderate canal stenosis with severe left and moderate right foraminal stenosis. C5-C6: Small disc osteophyte complex with bilateral facet and uncovertebral arthropathy. Findings result in mild-moderate canal stenosis with moderate bilateral foraminal stenosis. C6-C7: Disc osteophyte complex with mild facet and uncovertebral arthropathy. Findings result in mild canal stenosis with moderate bilateral foraminal stenosis. C7-T1: No significant disc  protrusion, foraminal stenosis, or canal stenosis. IMPRESSION: 1. Motion degraded exam. 2. 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 No obvious cord signal abnormality/edema on limited exam. 3. At least mild canal stenosis from C2-C3 through C6-C7. Multilevel high-grade bilateral foraminal stenosis, as detailed above. These results will be called to the ordering clinician or representative by the Radiologist Assistant, and communication documented in the PACS or [employer]. Electronically Signed   By: Davina Poke D.O.  On: 08/19/2021 17:08   DG Pelvis Portable  Result Date: 08/19/2021 CLINICAL DATA:  Pt via EMS from home. Pt c/o generalized weakness and multiple falls for the past couple days. EMS reports bilateral leg swelling. Son states decreased appetite. Pt is baseline per son. Fall EXAM: PORTABLE PELVIS 1-2 VIEWS COMPARISON:  None. FINDINGS: Hips are located., no femoral neck fracture. No pelvic fracture. Degenerate spurring of the spine. IMPRESSION: No acute osseous abnormality. Electronically Signed   By: Suzy Bouchard M.D.   On: 08/19/2021 13:27   US Venous Img Lower Bilateral (DVT)  Result Date: 08/19/2021 CLINICAL DATA:  Bilateral leg pain EXAM: BILATERAL LOWER EXTREMITY VENOUS DOPPLER ULTRASOUND TECHNIQUE: Gray-scale sonography with graded compression, as well as color Doppler and duplex ultrasound were performed to evaluate the lower extremity deep venous systems from the level of the common femoral vein and including the common femoral, femoral, profunda femoral, popliteal and calf veins including the posterior tibial, peroneal and gastrocnemius veins when visible. The superficial great saphenous vein was also interrogated. Spectral Doppler was utilized to evaluate flow at rest and with distal augmentation maneuvers in the common femoral,  femoral and popliteal veins. COMPARISON:  Left lower extremity duplex 02/23/2016 FINDINGS: RIGHT LOWER EXTREMITY Common Femoral Vein: No evidence of thrombus. Normal compressibility, respiratory phasicity and response to augmentation. Saphenofemoral Junction: No evidence of thrombus. Normal compressibility and flow on color Doppler imaging. Profunda Femoral Vein: No evidence of thrombus. Normal compressibility and flow on color Doppler imaging. Femoral Vein: No evidence of thrombus. Normal compressibility, respiratory phasicity and response to augmentation. Popliteal Vein: No evidence of thrombus. Normal compressibility, respiratory phasicity and response to augmentation. Calf Veins: No evidence of thrombus. Normal compressibility and flow on color Doppler imaging. Superficial Great Saphenous Vein: No evidence of thrombus. Normal compressibility. Venous Reflux:  None. Other Findings:  Calf soft tissue edema. LEFT LOWER EXTREMITY Common Femoral Vein: No evidence of thrombus. Normal compressibility, respiratory phasicity and response to augmentation. Saphenofemoral Junction: There is peripheral nonocclusive thrombus consistent with chronic clot. The vessel remains compressible. Profunda Femoral Vein: No evidence of thrombus. Normal compressibility and flow on color Doppler imaging. Femoral Vein: No evidence of thrombus. Normal compressibility, respiratory phasicity and response to augmentation. Popliteal Vein: No evidence of thrombus. Normal compressibility, respiratory phasicity and response to augmentation. Calf Veins: No evidence of thrombus. Normal compressibility and flow on color Doppler imaging. Superficial Great Saphenous Vein: No evidence of thrombus. Normal compressibility. Venous Reflux:  None. Other Findings:  Calf soft tissue edema. IMPRESSION: 1. Peripheral nonocclusive thrombus at the left saphenofemoral junction is most consistent with chronic clot. 2. No evidence of acute DVT in either lower  extremity. 3. Calf soft tissue edema. Electronically Signed   By: Keith Rake M.D.   On: 08/19/2021 19:06   DG Chest Portable 1 View  Result Date: 08/19/2021 CLINICAL DATA:  76 year old male with history of trauma from a fall. EXAM: PORTABLE CHEST 1 VIEW COMPARISON:  Chest x-ray 08/11/2021. FINDINGS: Lung volumes are normal. No consolidative airspace disease. No pleural effusions. No pneumothorax. No pulmonary nodule or mass noted. Pulmonary vasculature and the cardiomediastinal silhouette are within normal limits. Atherosclerosis in the thoracic aorta. IMPRESSION: 1.  No radiographic evidence of acute cardiopulmonary disease. 2. Aortic atherosclerosis. Electronically Signed   By: Vinnie Langton M.D.   On: 08/19/2021 13:16   DG Chest Portable 1 View  Result Date: 07/30/2021 CLINICAL DATA:  Syncope.  History of CHF. EXAM: PORTABLE CHEST 1 VIEW COMPARISON:  07/20/2021 FINDINGS: Heart size  is normal. Lungs appear hyperinflated but clear. There is no pleural effusion or edema. No airspace opacities identified. Review of the visualized osseous structures is unremarkable. IMPRESSION: No active cardiopulmonary abnormalities. Electronically Signed   By: Kerby Moors M.D.   On: 07/30/2021 14:38   DG Abd 2 Views  Result Date: 08/11/2021 CLINICAL DATA:  Decreased urine output.  No bowel movement. EXAM: ABDOMEN - 2 VIEW COMPARISON:  None. FINDINGS: The bowel gas pattern is normal. There is no evidence of free air. No radio-opaque calculi or other significant radiographic abnormality is seen. IMPRESSION: Negative. Electronically Signed   By: Marijo Conception M.D.   On: 08/11/2021 08:12    Microbiology: Recent Results (from the past 240 hour(s))  Resp Panel by RT-PCR (Flu A&B, Covid) Nasopharyngeal Swab     Status: None   Collection Time: 08/19/21 12:05 PM   Specimen: Nasopharyngeal Swab; Nasopharyngeal(NP) swabs in vial transport medium  Result Value Ref Range Status   SARS Coronavirus 2 by RT PCR  NEGATIVE NEGATIVE Final    Comment: (NOTE) SARS-CoV-2 target nucleic acids are NOT DETECTED.  The SARS-CoV-2 RNA is generally detectable in upper respiratory specimens during the acute phase of infection. The lowest concentration of SARS-CoV-2 viral copies this assay can detect is 138 copies/mL. A negative result does not preclude SARS-Cov-2 infection and should not be used as the sole basis for treatment or other patient management decisions. A negative result may occur with  improper specimen collection/handling, submission of specimen other than nasopharyngeal swab, presence of viral mutation(s) within the areas targeted by this assay, and inadequate number of viral copies(<138 copies/mL). A negative result must be combined with clinical observations, patient history, and epidemiological information. The expected result is Negative.  Fact Sheet for Patients:  EntrepreneurPulse.com.au  Fact Sheet for Healthcare Providers:  IncredibleEmployment.be  This test is no t yet approved or cleared by the [place] FDA and  has been authorized for detection and/or diagnosis of SARS-CoV-2 by FDA under an Emergency Use Authorization (EUA). This EUA will remain  in effect (meaning this test can be used) for the duration of the COVID-19 declaration under Section 564(b)(1) of the Act, 21 U.S.C.section 360bbb-3(b)(1), unless the authorization is terminated  or revoked sooner.       Influenza A by PCR NEGATIVE NEGATIVE Final   Influenza B by PCR NEGATIVE NEGATIVE Final    Comment: (NOTE) The Xpert Xpress SARS-CoV-2/FLU/RSV plus assay is intended as an aid in the diagnosis of influenza from Nasopharyngeal swab specimens and should not be used as a sole basis for treatment. Nasal washings and aspirates are unacceptable for Xpert Xpress SARS-CoV-2/FLU/RSV testing.  Fact Sheet for Patients: EntrepreneurPulse.com.au  Fact Sheet for  Healthcare Providers: IncredibleEmployment.be  This test is not yet approved or cleared by the [place] FDA and has been authorized for detection and/or diagnosis of SARS-CoV-2 by FDA under an Emergency Use Authorization (EUA). This EUA will remain in effect (meaning this test can be used) for the duration of the COVID-19 declaration under Section 564(b)(1) of the Act, 21 U.S.C. section 360bbb-3(b)(1), unless the authorization is terminated or revoked.  Performed at [facility], [address] [place], [place] [address]      Labs: Basic Metabolic Panel: Recent Labs  Lab 08/17/21 0332 08/19/21 1143 08/20/21 0448 08/20/21 1446 08/21/21 0601  NA 142 140 140  --  144  K 4.0 4.4 3.9  --  3.9  CL 107 102 105  --  108  CO2 31 27 29   --  29  GLUCOSE 94 101* 80  --  59*  BUN 13 33* 30*  --  30*  CREATININE 1.09 2.61* 1.94*  --  1.50*  CALCIUM 8.3* 9.0 8.2*  --  8.1*  MG 1.8  --   --  1.8  --   PHOS 2.5  --   --   --   --    Liver Function Tests: Recent Labs  Lab 08/19/21 1143  AST 25  ALT 14  ALKPHOS 64  BILITOT 0.5  PROT 6.0*  ALBUMIN 3.1*   No results for input(s): LIPASE, AMYLASE in the last 168 hours. No results for input(s): AMMONIA in the last 168 hours. CBC: Recent Labs  Lab 08/17/21 0332 08/19/21 1143 08/20/21 0448  WBC 4.8 7.7 6.1  NEUTROABS 3.2  --   --   HGB 10.6* 10.6* 8.3*  HCT 32.1* 33.8* 26.1*  MCV 89.4 93.4 91.6  PLT 175 198 158   Cardiac Enzymes: Recent Labs  Lab 08/19/21 1143  CKTOTAL 125   BNP: BNP (last 3 results) Recent Labs    12/22/20 0321 07/07/21 1544 07/30/21 1432  BNP 175.5* 6.8 18.3    ProBNP (last 3 results) No results for input(s): PROBNP in the last 8760 hours.  CBG: Recent Labs  Lab 08/16/21 1125 08/16/21 1205 08/16/21 1643 08/17/21 0744 08/17/21 1123  GLUCAP 64* 142* 74 81 113*       Signed:  Desma Maxim MD.  [facility] 08/23/2021, 10:39 AM

## 2021-08-23 NOTE — Progress Notes (Signed)
Occupational Therapy Treatment ?Patient Details ?Name: Dillon Schultz ?MRN: [id number] ?DOB: [birth year] ?Today's Date: 08/23/2021 ? ? ?History of present illness Pt is a 76 y/o M admitted on 08/19/21 after presenting with c/c of falls. Pt was recently hospitalized for 1 week with AKI, hypotension, dehydration, orthostasis, reported poor PO intake all corrected with fluids. 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 PMH: dementia, HTN, dCHF ?  ?OT comments ? Pt seen for OT treatment on this date. Upon arrival to room, pt asleep in bed but responsive to name and agreeable to OT tx. Pt required MOD A for supine>sit transfer. Once seated EOB, pt required intermittent MIN A for trunk support and MAX A for seated LB dressing. Pt attempted x1 sit>stand transfer requiring MAX A. Once upright, pt observed to have BM. Pt required MAX A+2 for sit>supine transfer and MAX A for peri-care. Pt left sitting upright in bed, with RN present, eating breakfast. Pt continues to benefit from skilled OT services to maximize return to PLOF and minimize risk of future falls, injury, caregiver burden, and readmission. Will continue to follow POC. Discharge recommendation remains appropriate.    ? ?Recommendations for follow up therapy are one component of a multi-disciplinary discharge planning process, led by the attending physician.  Recommendations may be updated based on patient status, additional functional criteria and insurance authorization. ?   ?Follow Up Recommendations ? Skilled nursing-short term rehab (<3 hours/day)  ?  ?Assistance Recommended at Discharge Frequent or constant Supervision/Assistance  ?Patient can return home with the following ? Two people to help with walking and/or transfers;A lot of help with  bathing/dressing/bathroom;Assistance with cooking/housework;Assistance with feeding;Help with stairs or ramp for entrance ?  ?Equipment Recommendations ? BSC/3in1  ?  ?   ?Precautions / Restrictions Precautions ?Precautions: Fall ?Restrictions ?Weight Bearing Restrictions: No  ? ? ?  ? ?Mobility Bed Mobility ?Overal bed mobility: Needs Assistance ?Bed Mobility: Supine to Sit ?  ?  ?Supine to sit: Mod assist ?Sit to supine: Max assist, +2 for physical assistance ?  ?  ?  ? ?Transfers ?Overall transfer level: Needs assistance ?Equipment used: Rolling walker (2 wheels) ?Transfers: Sit to/from Stand ?Sit to Stand: Max assist ?  ?  ?  ?  ?  ?  ?  ?  ?Balance Overall balance assessment: Needs assistance ?Sitting-balance support: No upper extremity supported, Feet unsupported ?Sitting balance-Leahy Scale: Poor ?Sitting balance - Comments: Requires intermittent MIN A for trunk support d/t L lateral lean ?Postural control: Left lateral lean ?Standing balance support: Bilateral upper extremity supported, During functional activity, Reliant on assistive device for balance ?Standing balance-Leahy Scale: Zero ?Standing balance comment: Requires MAX A for standing balance ?  ?  ?  ?  ?  ?  ?  ?  ?  ?  ?  ?   ? ?ADL either performed or assessed with clinical judgement  ? ?ADL Overall ADL's : Needs assistance/impaired ?Eating/Feeding: Set up;Sitting ?Eating/Feeding Details (indicate cue type and reason): Able to feed self with spoon 3x. Minimal spillage noted ?  ?  ?  ?  ?  ?  ?  ?  ?Lower Body Dressing: Maximal assistance;Sitting/lateral leans ?Lower Body Dressing Details (indicate cue type and reason): To don/doff socks ?  ?  ?Toileting- Clothing Manipulation and Hygiene: Maximal assistance;Bed level ?  ?  ?  ?  ?  ?  ? ? ? ?  Cognition Arousal/Alertness: Lethargic ?Behavior During Therapy: [facility] for tasks assessed/performed ?Overall Cognitive Status: No family/caregiver present to determine baseline cognitive functioning ?  ?  ?  ?   ?  ?  ?  ?  ?  ?  ?  ?  ?  ?  ?  ?  ?General Comments: Only oriented to self. pt lethargic although opens eyes when cued to do so ?  ?  ?   ?   ?   ?   ? ? ?Pertinent Vitals/ Pain       Pain Assessment ?Pain Assessment: No/denies pain ? ?   ?   ? ?Frequency ? Min 2X/week  ? ? ? ? ?  ?Progress Toward Goals ? ?OT Goals(current goals can now be found in the care plan section) ? Progress towards OT goals: Progressing toward goals ? ?Acute Rehab OT Goals ?OT Goal Formulation: Patient unable to participate in goal setting ?Time For Goal Achievement: 09/04/21 ?Potential to Achieve Goals: Fair  ?Plan Discharge plan remains appropriate;Frequency remains appropriate   ? ?   ?AM-PAC OT "6 Clicks" Daily Activity     ?Outcome Measure ? ? Help from another person eating meals?: A Little ?Help from another person taking care of personal grooming?: A Little ?Help from another person toileting, which includes using toliet, bedpan, or urinal?: A Lot ?Help from another person bathing (including washing, rinsing, drying)?: A Lot ?Help from another person to put on and taking off regular upper body clothing?: A Little ?Help from another person to put on and taking off regular lower body clothing?: A Lot ?6 Click Score: 15 ? ?  ?End of Session Equipment Utilized During Treatment: Rolling walker (2 wheels) ? ?OT Visit Diagnosis: Other abnormalities of gait and mobility (R26.89);Other symptoms and signs involving cognitive function ?  ?Activity Tolerance Patient tolerated treatment well ?  ?Patient Left in bed;with call bell/phone within reach;with bed alarm set;with nursing/sitter in room ?  ?Nurse Communication Mobility status ?  ? ?   ? ?Time: 8250-0370 ?OT Time Calculation (min): 25 min ? ?Charges: OT General Charges ?$OT Visit: 1 Visit ?OT Treatments ?$Self Care/Home Management : 23-37 mins ? ?Fredirick Maudlin, OTR/L ?[place] ? ?

## 2021-08-23 NOTE — Progress Notes (Signed)
[facility]) Hospital Liaison Note ?  ?Patient has been approved for [facility] services.  ?Patient has a new need for 02 as of yesterday evening. [place] [facility] team has been notified and 02 has been ordered. [facility] staff also aware that plan for DC is today ? ?Please send signed and completed DNR home with patient/family. Please provide prescriptions at discharge as needed to ensure ongoing symptom management.  ?  ?Please call with any questions/concerns.  ?  ?Thank you for the opportunity to participate in this patient's care. ?  ?Daphene Calamity, MSW ?[place]  ?[phone] ?

## 2021-08-26 ENCOUNTER — Emergency Department

## 2021-08-26 ENCOUNTER — Other Ambulatory Visit: Payer: Self-pay

## 2021-08-26 ENCOUNTER — Emergency Department
Admission: EM | Admit: 2021-08-26 | Discharge: 2021-08-26 | Disposition: A | Discharge status: Home / Self Care | Attending: Emergency Medicine | Admitting: Emergency Medicine

## 2021-08-26 ENCOUNTER — Encounter: Payer: Self-pay | Admitting: Emergency Medicine

## 2021-08-26 DIAGNOSIS — Z79899 Other long term (current) drug therapy: Secondary | ICD-10-CM | POA: Diagnosis not present

## 2021-08-26 DIAGNOSIS — Z7982 Long term (current) use of aspirin: Secondary | ICD-10-CM | POA: Insufficient documentation

## 2021-08-26 DIAGNOSIS — N179 Acute kidney failure, unspecified: Secondary | ICD-10-CM | POA: Diagnosis not present

## 2021-08-26 DIAGNOSIS — F0390 Unspecified dementia without behavioral disturbance: Secondary | ICD-10-CM | POA: Diagnosis present

## 2021-08-26 DIAGNOSIS — Z20822 Contact with and (suspected) exposure to covid-19: Secondary | ICD-10-CM | POA: Diagnosis not present

## 2021-08-26 DIAGNOSIS — W06XXXA Fall from bed, initial encounter: Secondary | ICD-10-CM | POA: Insufficient documentation

## 2021-08-26 DIAGNOSIS — R296 Repeated falls: Secondary | ICD-10-CM

## 2021-08-26 DIAGNOSIS — I10 Essential (primary) hypertension: Secondary | ICD-10-CM | POA: Diagnosis not present

## 2021-08-26 LAB — RESP PANEL BY RT-PCR (FLU A&B, COVID) ARPGX2
Influenza A by PCR: NEGATIVE
Influenza B by PCR: NEGATIVE
SARS Coronavirus 2 by RT PCR: NEGATIVE

## 2021-08-26 LAB — CBC
HCT: 35.4 % — ABNORMAL LOW (ref 39.0–52.0)
Hemoglobin: 11.1 g/dL — ABNORMAL LOW (ref 13.0–17.0)
MCH: 28.9 pg (ref 26.0–34.0)
MCHC: 31.4 g/dL (ref 30.0–36.0)
MCV: 92.2 fL (ref 80.0–100.0)
Platelets: 162 10*3/uL (ref 150–400)
RBC: 3.84 MIL/uL — ABNORMAL LOW (ref 4.22–5.81)
RDW: 14.7 % (ref 11.5–15.5)
WBC: 8.3 10*3/uL (ref 4.0–10.5)
nRBC: 0 % (ref 0.0–0.2)

## 2021-08-26 LAB — URINALYSIS, ROUTINE W REFLEX MICROSCOPIC
Bacteria, UA: NONE SEEN
Bilirubin Urine: NEGATIVE
Glucose, UA: NEGATIVE mg/dL
Ketones, ur: 5 mg/dL — AB
Leukocytes,Ua: NEGATIVE
Nitrite: NEGATIVE
Protein, ur: NEGATIVE mg/dL
Specific Gravity, Urine: 1.014 (ref 1.005–1.030)
Squamous Epithelial / HPF: NONE SEEN (ref 0–5)
pH: 5 (ref 5.0–8.0)

## 2021-08-26 LAB — BASIC METABOLIC PANEL
Anion gap: 7 (ref 5–15)
BUN: 24 mg/dL — ABNORMAL HIGH (ref 8–23)
CO2: 29 mmol/L (ref 22–32)
Calcium: 8.7 mg/dL — ABNORMAL LOW (ref 8.9–10.3)
Chloride: 100 mmol/L (ref 98–111)
Creatinine, Ser: 1.34 mg/dL — ABNORMAL HIGH (ref 0.61–1.24)
GFR, Estimated: 55 mL/min — ABNORMAL LOW (ref >60)
Glucose, Bld: 94 mg/dL (ref 70–99)
Potassium: 3.8 mmol/L (ref 3.5–5.1)
Sodium: 136 mmol/L (ref 135–145)

## 2021-08-26 LAB — VALPROIC ACID LEVEL: Valproic Acid Lvl: 14 ug/mL — ABNORMAL LOW (ref 50.0–100.0)

## 2021-08-26 LAB — CBG MONITORING, ED: Glucose-Capillary: 88 mg/dL (ref 70–99)

## 2021-08-26 MED ORDER — SODIUM CHLORIDE 0.9 % IV BOLUS (SEPSIS)
1000.0000 mL | Freq: Once | INTRAVENOUS | Status: AC
  Administered 2021-08-26: 1000 mL via INTRAVENOUS

## 2021-08-26 NOTE — ED Notes (Signed)
Per EMS pt had incontinent episode of stool but unknown where it is on patient. ?

## 2021-08-26 NOTE — Discharge Instructions (Addendum)
CT of the head and cervical spine showed no acute traumatic injury today.  Blood work showed mild elevation of the kidney function with a creatinine of 1.3 however this has significantly improved since recent admission in February.  We have given him IV fluids here.  The rest of his lab work, EKG, urine, COVID and flu swabs were normal today. ?

## 2021-08-26 NOTE — ED Triage Notes (Signed)
Pt arrived via [facility] from home with reports of unwitnessed fall, pt was found on floor between the bed and bed rail. Pt has dementia and is unable to tell how he fell.  Pt recently in hospital for weakness and multiple falls. ? ?Per EMS APS will be involved due to son not being able to care for patient and having limited mobility himself. ? ?Pt arrived with DNR paper.   ? ? ?

## 2021-08-26 NOTE — Progress Notes (Signed)
[facility] ED 01 [facility])   ?    ?This patient is a current hospice patient with [facility], admitted with a terminal diagnosis severe protein calorie malnutrition. ?  ?[facility] will continue to follow for any discharge planning needs and to coordinate continuation of hospice care.  ?   ?Please call with any questions/concerns.  ?  ?Thank you for the opportunity to participate in this patient's care. ? ?Daphene Calamity, MSW ?[place]  ?[phone] ? ?

## 2021-08-26 NOTE — ED Notes (Signed)
Pt transported to CT ?

## 2021-08-26 NOTE — ED Notes (Signed)
[facility] to transport to home ?

## 2021-08-26 NOTE — ED Provider Notes (Addendum)
[facility] Provider Note    Event Date/Time   First MD Initiated Contact with Patient 08/26/21 [id number]     (approximate)   History   Fall   HPI  Dillon Schultz is a 76 y.o. male with history of hypertension, hyperlipidemia, dementia who lives at home with his wife and son who is currently on hospice who presents to the emergency department after frequent falls.  Patient brought in by EMS.  Patient not able to provide any history.  No family currently at bedside.  Patient denies any pain.  Called patient's son Deniz Hannan at [phone].  He was doing well yesterday.  He states patient had unwitnessed fall at home out of bed around 5pm yesterday.  He is on ASA.  No other blood thinners.  He is bed bound mostly.  He states he feels he is able to care for his father at home and does not want him placed into a nursing facility.  He does request that we try to get the patient to eat here as he states he does not eat or drink well at home.  He denies any other acute concerns just fevers, cough, vomiting or diarrhea.  History provided by EMS, son by phone.  Level V caveat due to dementia.    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 Authorizing Provider  citalopram  (CELEXA) 40 MG tablet Take 40 mg by mouth daily. 03/27/21   [provider]  divalproex (DEPAKOTE) 125 MG DR tablet Take 1 tablet (125 mg total) by mouth every 12 (twelve) hours. 07/11/21   Fritzi Mandes, MD  gabapentin (NEURONTIN) 100 MG capsule Take 100 mg by mouth 3 (three) times daily. 07/18/21   [provider]  LORazepam (ATIVAN) 1 MG tablet Take 1 tablet (1 mg total) by mouth every 4 (four) hours as needed for anxiety. 08/23/21   Wouk, Ailene Rud, MD  mirtazapine (REMERON SOL-TAB) 15 MG disintegrating tablet Take 0.5 tablets (7.5 mg total) by mouth at bedtime. 08/16/21   Sharen Hones, MD  polyethylene glycol (MIRALAX / GLYCOLAX) 17 g packet Take 17 g by mouth daily. Patient not taking: Reported on 08/19/2021 08/17/21   Sharen Hones, MD  predniSONE (DELTASONE) 2.5 MG tablet Take 1 tablet (2.5 mg total) by mouth 2 (two) times daily with a meal. 08/23/21   Wouk, Ailene Rud, MD  QUEtiapine (SEROQUEL) 50 MG tablet Take 1 tablet (50 mg total) by mouth 2 (two) times daily. 08/23/21   Wouk, Ailene Rud, MD  senna-docusate (SENOKOT-S) 8.6-50 MG tablet Take 1 tablet by mouth 2 (two) times daily. 08/16/21   Sharen Hones, MD  traMADol (ULTRAM) 50 MG tablet Take 1  tablet (50 mg total) by mouth every 6 (six) hours as needed for moderate pain. 08/23/21   Wouk, Ailene Rud, MD  azelastine (ASTELIN) 137 MCG/SPRAY nasal spray Place 2 sprays into the nose 2 (two) times daily. Reported on 12/08/2015  12/22/20  [provider]  cetirizine (ZYRTEC) 10 MG tablet Take 10 mg by mouth daily. Reported on 12/08/2015  12/22/20  [provider]  furosemide (LASIX) 20 MG tablet Take 20 mg by mouth daily.  12/22/20  [provider]    Physical Exam   Triage Vital Signs: ED Triage Vitals  Enc Vitals Group     BP 08/26/21 0029 (!) 156/89     Pulse Rate 08/26/21 0029 73     Resp 08/26/21 0029 18     Temp 08/26/21 0029 98.2 F (36.8 C)     Temp Source 08/26/21 0029 Oral     SpO2 08/26/21  0027 100 %     Weight 08/26/21 0031 160 lb (72.6 kg)     Height 08/26/21 0031 '5\' 8"'$  (1.727 m)     Head Circumference --      Peak Flow --      Pain Score --      Pain Loc --      Pain Edu? --      Excl. in [place]? --     Most recent vital signs: Vitals:   08/26/21 0029 08/26/21 0507  BP: (!) 156/89 (!) 189/97  Pulse: 73 66  Resp: 18 16  Temp: 98.2 F (36.8 C) 98.3 F (36.8 C)  SpO2: 100% 99%     CONSTITUTIONAL: Alert and oriented to person only.  Elderly, thin.  Denies any pain.  In no distress.  Intermittently agitated but redirectable. HEAD: Normocephalic; atraumatic EYES: Conjunctivae clear, PERRL, EOMI ENT: normal nose; no rhinorrhea; moist mucous membranes; pharynx without lesions noted; no dental injury; no septal hematoma, no epistaxis; no facial deformity or bony tenderness NECK: Supple, no midline spinal tenderness, step-off or deformity; trachea midline CARD: RRR; S1 and S2 appreciated; no murmurs, no clicks, no rubs, no gallops RESP: Normal chest excursion without splinting or tachypnea; breath sounds clear and equal bilaterally; no wheezes, no rhonchi, no rales; no hypoxia or respiratory distress CHEST:  chest wall stable, no crepitus or ecchymosis or deformity, nontender to palpation; no flail chest ABD/GI: Normal bowel sounds; non-distended; soft, non-tender, no rebound, no guarding; no ecchymosis or other lesions noted PELVIS:  stable, nontender to palpation BACK:  The back appears normal; no midline spinal tenderness, step-off or deformity EXT: Normal ROM in all joints; non-tender to palpation; no edema; normal capillary refill; no cyanosis, no bony tenderness or bony deformity of patient's extremities, no joint effusion, compartments are soft, extremities are warm and well-perfused, no ecchymosis SKIN: Normal color for age and race; warm NEURO: No facial asymmetry, normal speech, moving all extremities equally  ED Results / Procedures / Treatments   LABS: (all  labs ordered are listed, but only abnormal results are displayed) Labs Reviewed  BASIC METABOLIC PANEL - Abnormal; Notable for the following components:      Result Value   BUN 24 (*)    Creatinine, Ser 1.34 (*)    Calcium 8.7 (*)    GFR, Estimated 55 (*)    All other components within normal limits  CBC - Abnormal; Notable for the following components:   RBC 3.84 (*)    Hemoglobin 11.1 (*)    HCT 35.4 (*)    All other  components within normal limits  URINALYSIS, ROUTINE W REFLEX MICROSCOPIC - Abnormal; Notable for the following components:   Color, Urine YELLOW (*)    APPearance CLEAR (*)    Hgb urine dipstick SMALL (*)    Ketones, ur 5 (*)    All other components within normal limits  VALPROIC ACID LEVEL - Abnormal; Notable for the following components:   Valproic Acid Lvl 14 (*)    All other components within normal limits  RESP PANEL BY RT-PCR (FLU A&B, COVID) ARPGX2  CBG MONITORING, ED     EKG:  EKG Interpretation  Date/Time:  Saturday August 26 2021 00:31:20 EST Ventricular Rate:  72 PR Interval:  138 QRS Duration: 88 QT Interval:  414 QTC Calculation: 453 R Axis:   90 Text Interpretation: Normal sinus rhythm Rightward axis Nonspecific T wave abnormality Abnormal ECG When compared with ECG of 20-Aug-2021 14:56, (unconfirmed) Vent. rate has increased BY  30 BPM Confirmed by Pryor Curia [phone]) on 08/26/2021 5:32:55 AM          RADIOLOGY: My personal review and interpretation of imaging: CT head and cervical spine show no acute traumatic injury.  I have personally reviewed all radiology reports. CT HEAD WO CONTRAST (5MM)  Result Date: 08/26/2021 CLINICAL DATA:  Recent fall EXAM: CT HEAD WITHOUT CONTRAST CT CERVICAL SPINE WITHOUT CONTRAST TECHNIQUE: Multidetector CT imaging of the head and cervical spine was performed following the standard protocol without intravenous contrast. Multiplanar CT image reconstructions of the cervical spine were also generated.  RADIATION DOSE REDUCTION: This exam was performed according to the departmental dose-optimization program which includes automated exposure control, adjustment of the mA and/or kV according to patient size and/or use of iterative reconstruction technique. COMPARISON:  08/19/2021 FINDINGS: CT HEAD FINDINGS Brain: No evidence of acute infarction, hemorrhage, hydrocephalus, extra-axial collection or mass lesion/mass effect. Chronic atrophic and ischemic changes are noted. Vascular: No hyperdense vessel or unexpected calcification. Skull: Normal. Negative for fracture or focal lesion. Sinuses/Orbits: No acute finding. Other: None. CT CERVICAL SPINE FINDINGS Alignment: Within normal limits. Skull base and vertebrae: 7 cervical segments are well visualized. Vertebral body height is well maintained. No acute fracture or acute facet abnormality is noted. Osteophytic changes and facet hypertrophic changes are noted throughout the cervical spine. Posterior osteophytic changes are noted at C3-4 similar to that noted on the prior exam with some degree of central canal stenosis. Soft tissues and spinal canal: Surrounding soft tissue structures are within normal limits. Upper chest: Visualized lung apices are within normal limits. Other: None IMPRESSION: CT of the head: Chronic atrophic and ischemic changes without acute abnormality. CT of the cervical spine: Multilevel degenerative change without acute abnormality. These changes appear worst at C3-4 similar to that noted on the prior study. Electronically Signed   By: Inez Catalina M.D.   On: 08/26/2021 01:31   CT Cervical Spine Wo Contrast  Result Date: 08/26/2021 CLINICAL DATA:  Recent fall EXAM: CT HEAD WITHOUT CONTRAST CT CERVICAL SPINE WITHOUT CONTRAST TECHNIQUE: Multidetector CT imaging of the head and cervical spine was performed following the standard protocol without intravenous contrast. Multiplanar CT image reconstructions of the cervical spine were also generated.  RADIATION DOSE REDUCTION: This exam was performed according to the departmental dose-optimization program which includes automated exposure control, adjustment of the mA and/or kV according to patient size and/or use of iterative reconstruction technique. COMPARISON:  08/19/2021 FINDINGS: CT HEAD FINDINGS Brain: No evidence of acute infarction, hemorrhage, hydrocephalus, extra-axial collection or mass lesion/mass effect. Chronic  atrophic and ischemic changes are noted. Vascular: No hyperdense vessel or unexpected calcification. Skull: Normal. Negative for fracture or focal lesion. Sinuses/Orbits: No acute finding. Other: None. CT CERVICAL SPINE FINDINGS Alignment: Within normal limits. Skull base and vertebrae: 7 cervical segments are well visualized. Vertebral body height is well maintained. No acute fracture or acute facet abnormality is noted. Osteophytic changes and facet hypertrophic changes are noted throughout the cervical spine. Posterior osteophytic changes are noted at C3-4 similar to that noted on the prior exam with some degree of central canal stenosis. Soft tissues and spinal canal: Surrounding soft tissue structures are within normal limits. Upper chest: Visualized lung apices are within normal limits. Other: None IMPRESSION: CT of the head: Chronic atrophic and ischemic changes without acute abnormality. CT of the cervical spine: Multilevel degenerative change without acute abnormality. These changes appear worst at C3-4 similar to that noted on the prior study. Electronically Signed   By: Inez Catalina M.D.   On: 08/26/2021 01:31     PROCEDURES:  Critical Care performed: No   CRITICAL CARE Performed by: Cyril Mourning Sosie Gato   Total critical care time: 0 minutes  Critical care time was exclusive of separately billable procedures and treating other patients.  Critical care was necessary to treat or prevent imminent or life-threatening deterioration.  Critical care was time spent personally by  me on the following activities: development of treatment plan with patient and/or surrogate as well as nursing, discussions with consultants, evaluation of patient's response to treatment, examination of patient, obtaining history from patient or surrogate, ordering and performing treatments and interventions, ordering and review of laboratory studies, ordering and review of radiographic studies, pulse oximetry and re-evaluation of patient's condition.   Marland Kitchen1-3 Lead EKG Interpretation Performed by: Berta Denson, Delice Bison, DO Authorized by: Zakyla Tonche, Delice Bison, DO     Interpretation: normal     ECG rate:  65   ECG rate assessment: normal     Rhythm: sinus rhythm     Ectopy: none     Conduction: normal      IMPRESSION / MDM / ASSESSMENT AND PLAN / ED COURSE  I reviewed the triage vital signs and the nursing notes.  Patient here after an unwitnessed fall out of bed.  The patient is on the cardiac monitor to evaluate for evidence of arrhythmia and/or significant heart rate changes.   DIFFERENTIAL DIAGNOSIS (includes but not limited to):   Head injury, concussion, intracranial hemorrhage, skull fracture, cervical spine fracture, dehydration, UTI, anemia, electrolyte derangement, AKI   PLAN: We will obtain CBC, BMP, Depakote level, EKG, urine, CT of the head and cervical spine.   MEDICATIONS GIVEN IN ED: Medications  sodium chloride 0.9 % bolus 1,000 mL (0 mLs Intravenous Stopped 08/26/21 0644)     ED COURSE: Patient's labs show a mild AKI.  Creatinine is 1.34 and was 1.09 08/17/2021.  He was just admitted to the hospital for AKI and this has improved from where it was at 2.61 on 08/19/2021.  We will continue to hydrate here.  Electrolytes within normal limits.  Hemoglobin has improved compared to previous.  No leukocytosis.  CT of the head and cervical spine reviewed by myself and radiology and show no acute traumatic injury.  Urine shows no sign of infection or dehydration.  COVID and flu swabs  negative.  Rectal temperature normal.  He seems to be at his neurologic baseline.  He has been hemodynamically stable.  Family would like for him to be discharged home.  At this time, I do not feel there is any life-threatening condition present. I reviewed all nursing notes, vitals, pertinent previous records.  All lab and urine results, EKGs, imaging ordered have been independently reviewed and interpreted by myself.  I reviewed all available radiology reports from any imaging ordered this visit.  Based on my assessment, I feel the patient is safe to be discharged home without further emergent workup and can continue workup as an outpatient as needed. Discussed all findings, treatment plan as well as usual and customary return precautions with son.  They verbalize understanding and are comfortable with this plan.  Outpatient follow-up has been provided as needed.  All questions have been answered.    CONSULTS: No admission needed at this time given reassuring work-up and family would like patient transported back home as he is on hospice and is a DNR.   OUTSIDE RECORDS REVIEWED: Reviewed patient's recent admission on 08/19/2021 for AKI.    7:07 AM  Prior to discharge with EMS, patient attempted to get out of bed on his own and fell out of the bed.  Was found sitting on his bottom.  Is unclear if he hit his head but was complaining of right-sided chest pain now and left-sided headache and neck pain.  We will repeat imaging.  We will hold on transport until this imaging has been completed.  Remains hemodynamically stable.  Will monitor closely.  Signed out the oncoming ED physician to reassess repeat imaging.       FINAL CLINICAL IMPRESSION(S) / ED DIAGNOSES   Final diagnoses:  Unwitnessed fall  AKI (acute kidney injury) ([place])     Rx / DC Orders   ED Discharge Orders     None        Note:  This document was prepared using Dragon voice recognition software and may include  unintentional dictation errors.   Waldine Zenz, Delice Bison, DO 08/26/21 [place], Delice Bison, DO 08/26/21 [phone]

## 2021-08-28 ENCOUNTER — Other Ambulatory Visit: Payer: Self-pay

## 2021-08-28 ENCOUNTER — Encounter: Payer: Self-pay | Admitting: Emergency Medicine

## 2021-08-28 ENCOUNTER — Emergency Department

## 2021-08-28 ENCOUNTER — Emergency Department
Admission: EM | Admit: 2021-08-28 | Discharge: 2021-08-28 | Disposition: A | Discharge status: Home / Self Care | Attending: Student in an Organized Health Care Education/Training Program | Admitting: Student in an Organized Health Care Education/Training Program

## 2021-08-28 DIAGNOSIS — W06XXXA Fall from bed, initial encounter: Secondary | ICD-10-CM | POA: Diagnosis not present

## 2021-08-28 DIAGNOSIS — F0390 Unspecified dementia without behavioral disturbance: Secondary | ICD-10-CM | POA: Insufficient documentation

## 2021-08-28 DIAGNOSIS — R4182 Altered mental status, unspecified: Secondary | ICD-10-CM | POA: Diagnosis present

## 2021-08-28 DIAGNOSIS — W19XXXA Unspecified fall, initial encounter: Secondary | ICD-10-CM

## 2021-08-28 LAB — CBC
HCT: 35.6 % — ABNORMAL LOW (ref 39.0–52.0)
Hemoglobin: 11.4 g/dL — ABNORMAL LOW (ref 13.0–17.0)
MCH: 29.1 pg (ref 26.0–34.0)
MCHC: 32 g/dL (ref 30.0–36.0)
MCV: 90.8 fL (ref 80.0–100.0)
Platelets: 207 10*3/uL (ref 150–400)
RBC: 3.92 MIL/uL — ABNORMAL LOW (ref 4.22–5.81)
RDW: 14.3 % (ref 11.5–15.5)
WBC: 9.8 10*3/uL (ref 4.0–10.5)
nRBC: 0 % (ref 0.0–0.2)

## 2021-08-28 LAB — COMPREHENSIVE METABOLIC PANEL
ALT: 15 U/L (ref 0–44)
AST: 28 U/L (ref 15–41)
Albumin: 3.2 g/dL — ABNORMAL LOW (ref 3.5–5.0)
Alkaline Phosphatase: 76 U/L (ref 38–126)
Anion gap: 10 (ref 5–15)
BUN: 22 mg/dL (ref 8–23)
CO2: 27 mmol/L (ref 22–32)
Calcium: 9 mg/dL (ref 8.9–10.3)
Chloride: 98 mmol/L (ref 98–111)
Creatinine, Ser: 1.16 mg/dL (ref 0.61–1.24)
GFR, Estimated: >60 mL/min (ref >60)
Glucose, Bld: 102 mg/dL — ABNORMAL HIGH (ref 70–99)
Potassium: 3.7 mmol/L (ref 3.5–5.1)
Sodium: 135 mmol/L (ref 135–145)
Total Bilirubin: 1.1 mg/dL (ref 0.3–1.2)
Total Protein: 6.6 g/dL (ref 6.5–8.1)

## 2021-08-28 LAB — CK: Total CK: 366 U/L (ref 49–397)

## 2021-08-28 NOTE — ED Notes (Signed)
Pt unable to sign for discharge. EDP spoke with legal guardian regarding discharge. ? ?Pt resting in bed with eyes closed. ?

## 2021-08-28 NOTE — ED Triage Notes (Signed)
Pt via EMS from home. See first nurse note. Pt here for a fall. When asked, pt states "I didn't fall" Denies any pain. Pt is alert and oriented to self only.  ?

## 2021-08-28 NOTE — ED Triage Notes (Signed)
Pt to ED via [facility] from home for fall. Per EMS pt and his son are unsure how long pt has been in the floor. Pt rolled out of bed. Pt has hx/o dementia. Pt does have some right sided pain. Pt is in NAD.  ? ?Pt arrived with DNR paper and discharge papers from previous visit. According to last note, APS is involved with pt due to sons inability to care for pt due to limited mobility of son. ? ?

## 2021-08-28 NOTE — ED Notes (Signed)
Called [facility] for transport home to [place]  [phone] ?

## 2021-08-28 NOTE — ED Provider Notes (Signed)
? ?[facility] ?Provider Note ? ? ? Event Date/Time  ? First MD Initiated Contact with Patient 08/28/21 0911   ?  (approximate) ? ? ?History  ? ?Fall ? ? ?HPI ? ?Dillon Schultz is a 76 y.o. male presents to the ER for evaluation of fall out of bed last night.  Was an unwitnessed fall.  Patient denies any complaints at this time.  Reportedly told EMS he was having right-sided pain.  He denies any chest pain or shortness of breath no hip pain no leg pain.  No numbness or tingling.  Has had some increasing falls does have dementia and lives at home.  Denies any dysuria no nausea or vomiting. ?  ? ? ?Physical Exam  ? ?Triage Vital Signs: ?ED Triage Vitals  ?Enc Vitals Group  ?   BP 08/28/21 0850 (!) 192/99  ?   Pulse Rate 08/28/21 0850 76  ?   Resp 08/28/21 0850 20  ?   Temp 08/28/21 0850 98.4 ?F (36.9 ?C)  ?   Temp Source 08/28/21 0850 Oral  ?   SpO2 08/28/21 0850 99 %  ?   Weight 08/28/21 0848 165 lb (74.8 kg)  ?   Height 08/28/21 0848 '5\' 8"'$  (1.727 m)  ?   Head Circumference --   ?   Peak Flow --   ?   Pain Score --   ?   Pain Loc --   ?   Pain Edu? --   ?   Excl. in [place]? --   ? ? ?Most recent vital signs: ?Vitals:  ? 08/28/21 0850  ?BP: (!) 192/99  ?Pulse: 76  ?Resp: 20  ?Temp: 98.4 ?F (36.9 ?C)  ?SpO2: 99%  ? ? ? ?Constitutional: Alert in no NAD ?Eyes: Conjunctivae are normal.  ?Head: Atraumatic. ?Nose: No congestion/rhinnorhea. ?Mouth/Throat: Mucous membranes are moist.   ?Neck: Painless ROM.  ?Cardiovascular:   Good peripheral circulation. Normal HS ?Respiratory: Normal respiratory effort.  No retractions. No wheezing ?Gastrointestinal: Soft and nontender.  ?Musculoskeletal:  no deformity, no pain of BLE and BUE ?Neurologic:  MAE spontaneously. No gross focal neurologic deficits are appreciated.  ?Skin:  Skin is warm, dry and intact. No rash noted. ?Psychiatric: Mood and affect are normal. Speech and behavior are normal. ? ? ? ?ED Results / Procedures / Treatments  ? ?Labs ?(all labs ordered are  listed, but only abnormal results are displayed) ?Labs Reviewed  ?COMPREHENSIVE METABOLIC PANEL - Abnormal; Notable for the following components:  ?    Result Value  ? Glucose, Bld 102 (*)   ? Albumin 3.2 (*)   ? All other components within normal limits  ?CBC - Abnormal; Notable for the following components:  ? RBC 3.92 (*)   ? Hemoglobin 11.4 (*)   ? HCT 35.6 (*)   ? All other components within normal limits  ?CK  ?CBG MONITORING, ED  ? ? ? ?EKG ? ?ED ECG REPORT ?I, Merlyn Lot, the attending physician, personally viewed and interpreted this ECG. ? ? Date: 08/28/2021 ? EKG Time: 9:01 ? Rate: 70 ? Rhythm: sinus ? Axis: normal ? Intervals: normal ? ST&T Change: no stemi, no depression, nonspecific st abn ? ? ? ?RADIOLOGY ?Please see ED Course for my review and interpretation. ? ?I personally reviewed all radiographic images ordered to evaluate for the above acute complaints and reviewed radiology reports and findings.  These findings were personally discussed with the patient.  Please see medical record for radiology report. ? ? ? ?  PROCEDURES: ? ?Critical Care performed:  ? ?Procedures ? ? ?MEDICATIONS ORDERED IN ED: ?Medications - No data to display ? ? ?IMPRESSION / MDM / ASSESSMENT AND PLAN / ED COURSE  ?I reviewed the triage vital signs and the nursing notes. ?             ?               ? ?Differential diagnosis includes, but is not limited to, hydration, electrolyte abnormality, mechanical fall, fracture, SDH, IPH, CVA, sepsis ? ?Patient presenting for fall out of bed last night.  Patient denies any pain or symptoms at this time.  Has had frequent falls.  Does have dementia and being cared for by patient's son.  His exam is reassuring.  He denies any symptoms at this time.  Bladder present for the but differential order CT imaging as well as chest x-ray. ? ? ?Clinical Course as of 08/28/21 0956  ?Mon Aug 28, 2021  ?2426 Patient reassessed.  Remains well-appearing.  CT by my review does not show any  evidence of subdural or mass.  No sign of acute intracranial abnormality per radiology report.  My review of chest x-ray there is no sign of pneumothorax infiltrate or consolidation.  His blood work is reassuring CKs normal.  Renal function improved as compared to previous.  Spoke with the patient's son who has no additional concerns like the patient be transferred back to their home. [PR]  ?  ?Clinical Course User Index ?[PR] Merlyn Lot, MD  ? ? ? ?FINAL CLINICAL IMPRESSION(S) / ED DIAGNOSES  ? ?Final diagnoses:  ?Fall, initial encounter  ? ? ? ?Rx / DC Orders  ? ?ED Discharge Orders   ? ? None  ? ?  ? ? ? ?Note:  This document was prepared using Dragon voice recognition software and may include unintentional dictation errors. ? ?  ?Merlyn Lot, MD ?08/28/21 [phone] ? ?

## 2021-08-28 NOTE — ED Notes (Signed)
Pt went to scans and back. EDP evaluated pt at bedside. ?

## 2021-09-23 DEATH — deceased

## 2022-12-29 IMAGING — CR DG CHEST 2V
2 series · 2 of 2 positions shown · non-contrast
Comparison: 08/26/2021

CLINICAL DATA: Fall.  Evaluate for infection versus fracture.

EXAM:
CHEST - 2 VIEW

[chest lat]
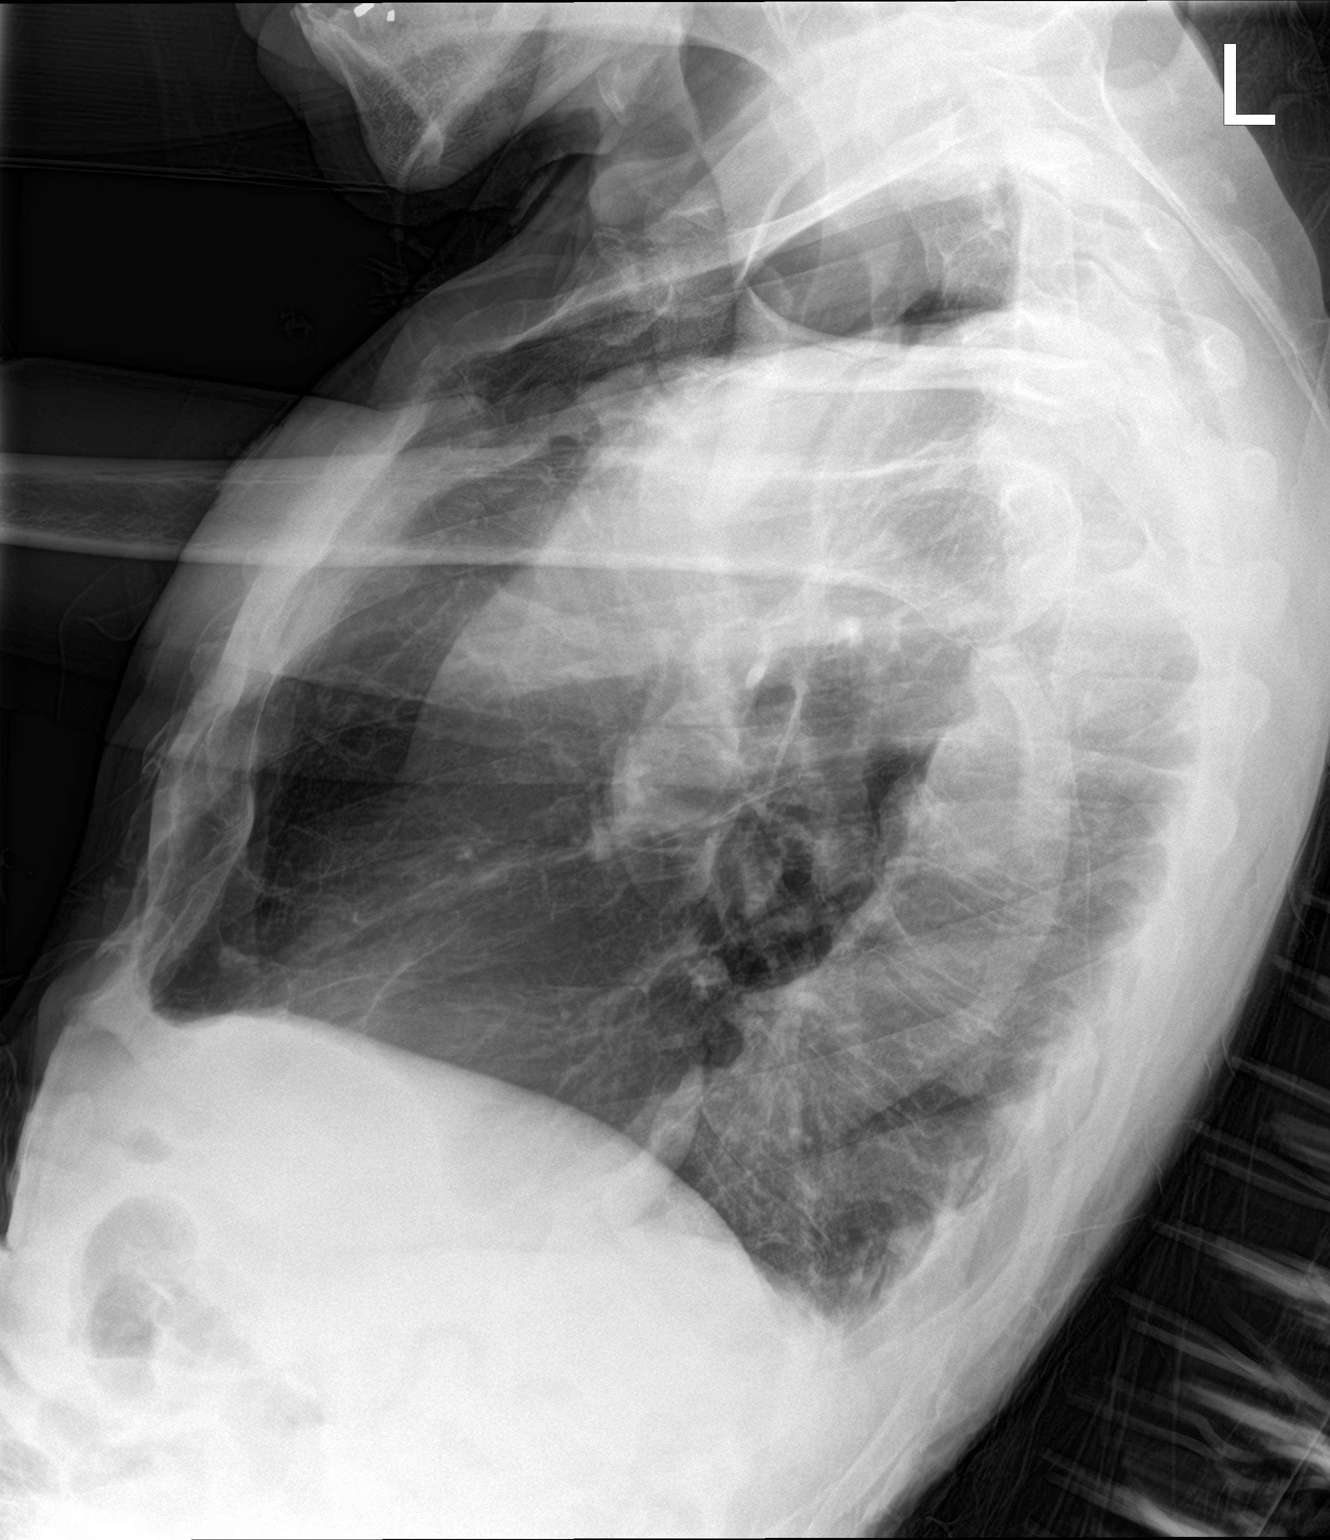

[chest ap]
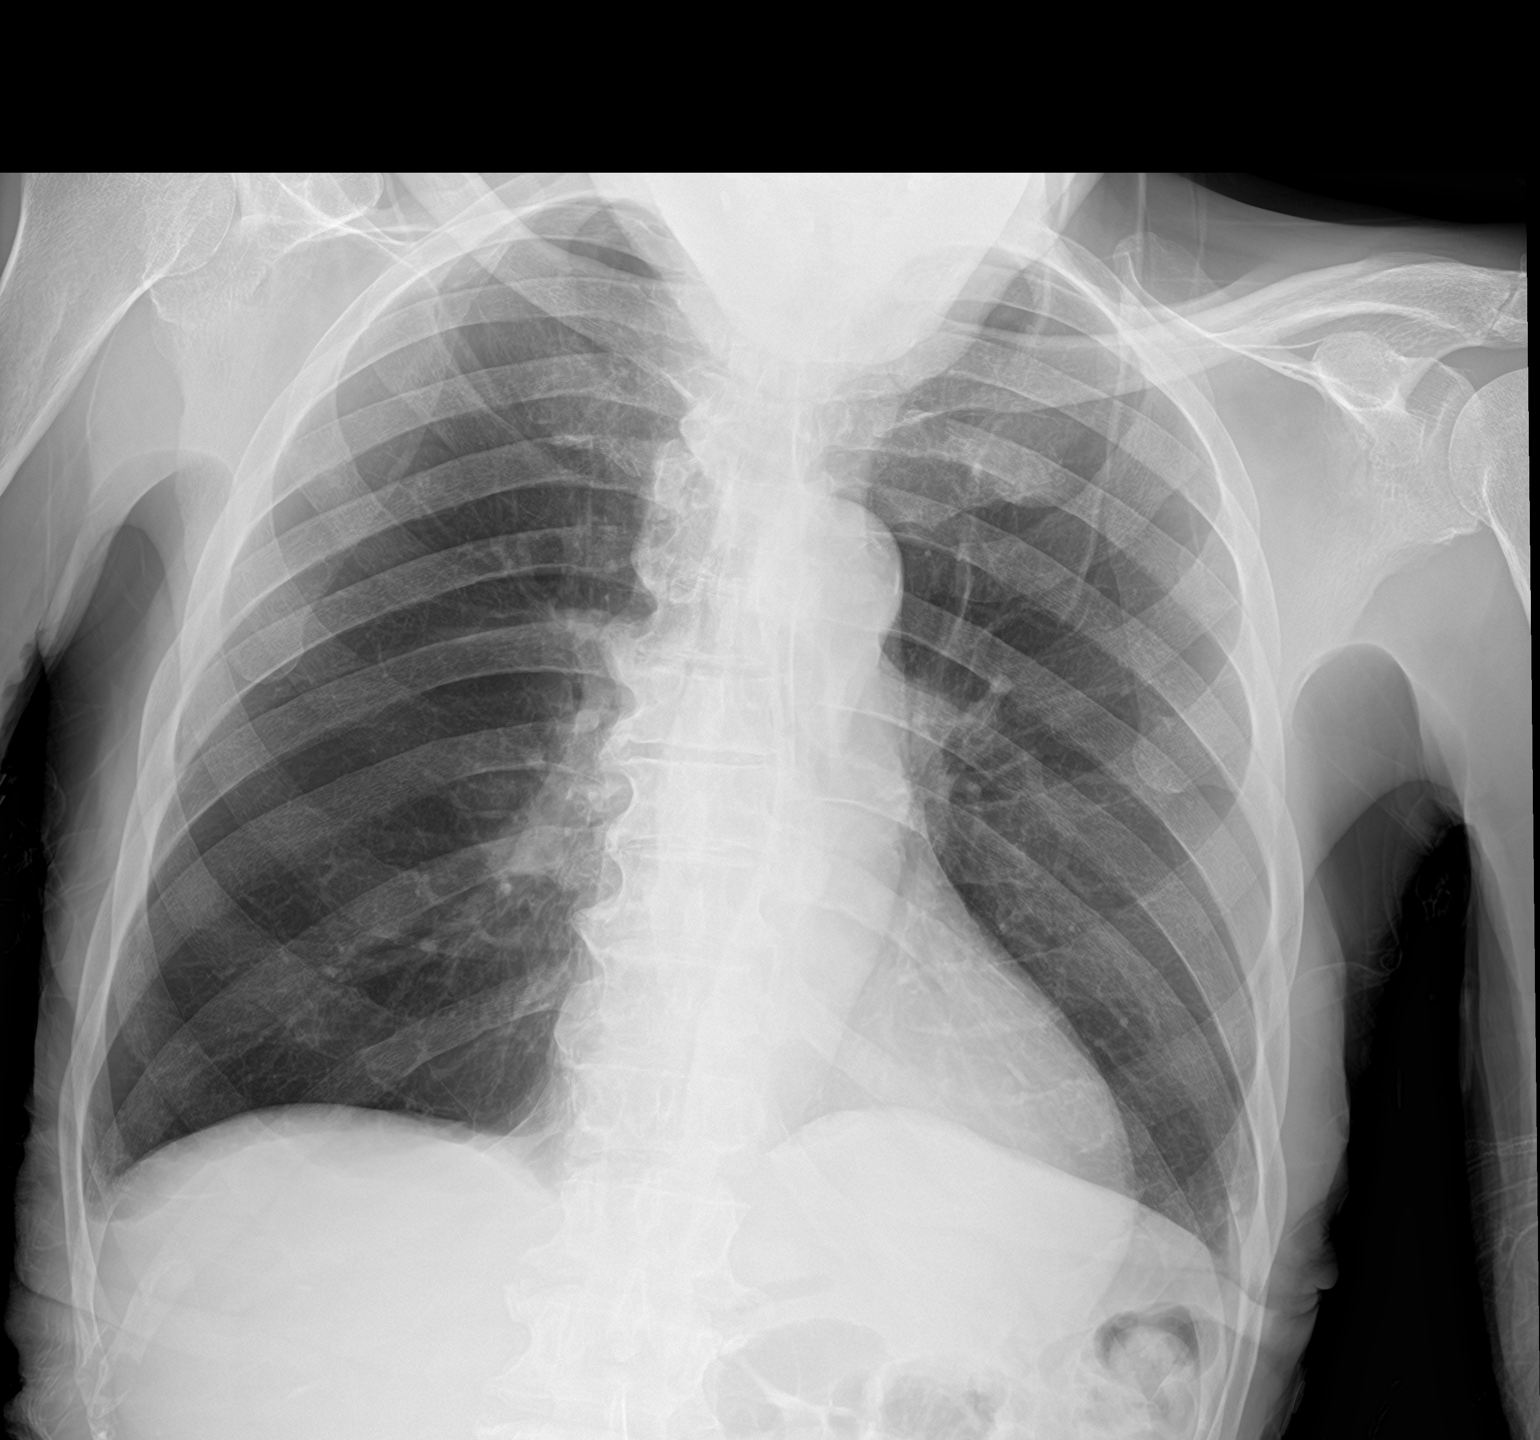

[2 of 2 positions shown; findings below may reference images not displayed]

FINDINGS: Lungs are adequately inflated without focal airspace consolidation
or effusion. No definite pneumothorax. Cardiomediastinal silhouette
and remainder of the exam is unchanged.
IMPRESSION: No active cardiopulmonary disease.

## 2022-12-29 IMAGING — CT CT CERVICAL SPINE W/O CM
3 series · 13 of 33 positions shown, 16 images · non-contrast
Comparison: 08/26/2021.

CLINICAL DATA: Neck trauma (Age >= 65y)



[Series 4: sag bone · sagittal · 0.41mm/px · 5 of 75 slices shown, 6 images]
[im 25/75  bone]
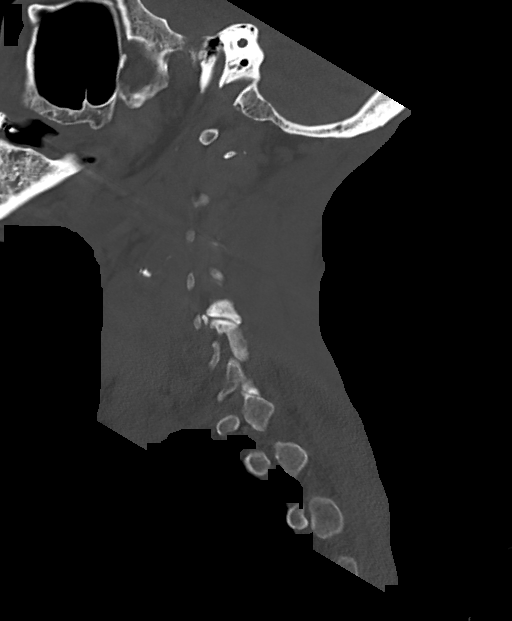
[im 31/75  bone]
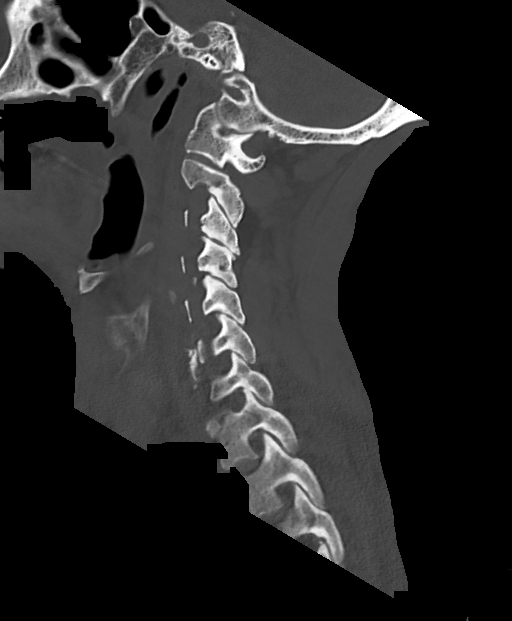
[im 38/75  soft-tissue]
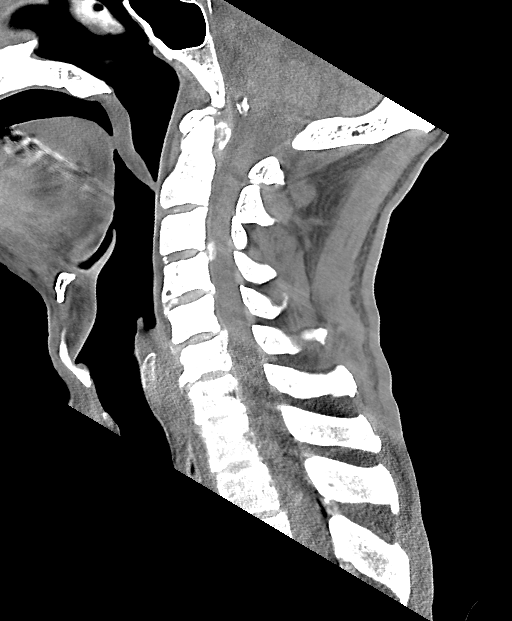
[im 38/75  bone]
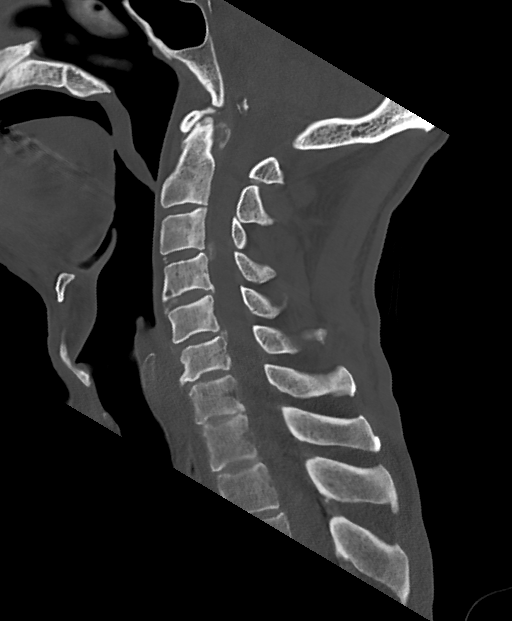
[im 44/75  bone]
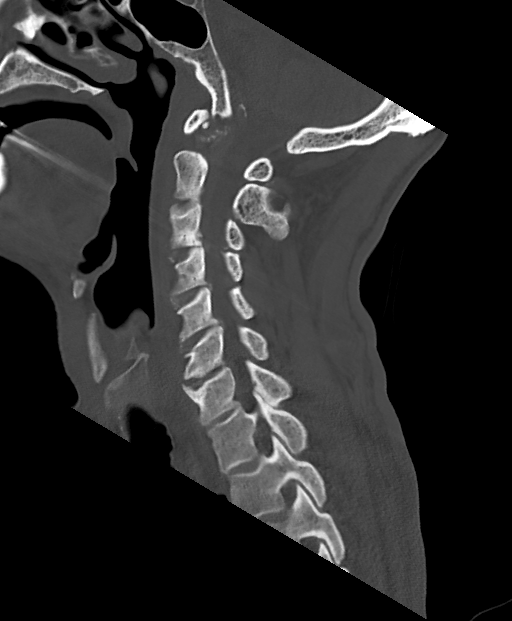
[im 50/75  bone]
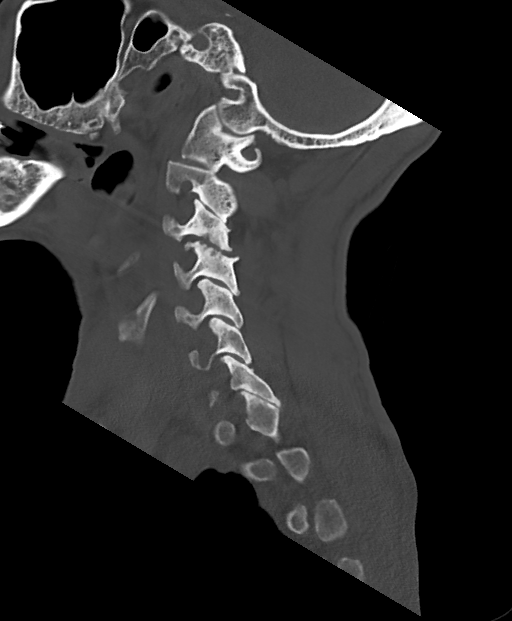

[Series 5: c spine soft · axial · 0.40mm/px · z∈[-270,-150]mm · 5 of 88 slices shown, 7 images]
[im 14/88  soft-tissue]
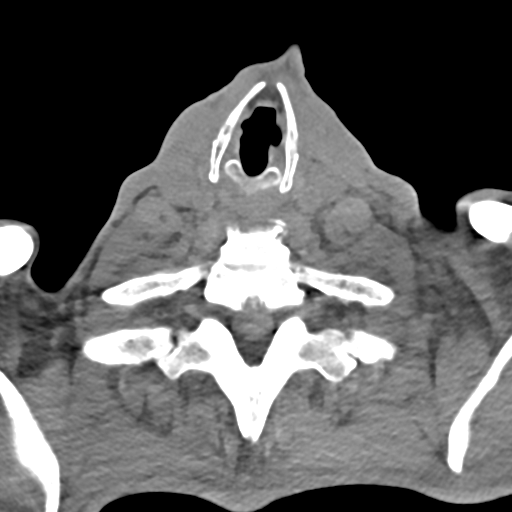
[im 14/88  bone]
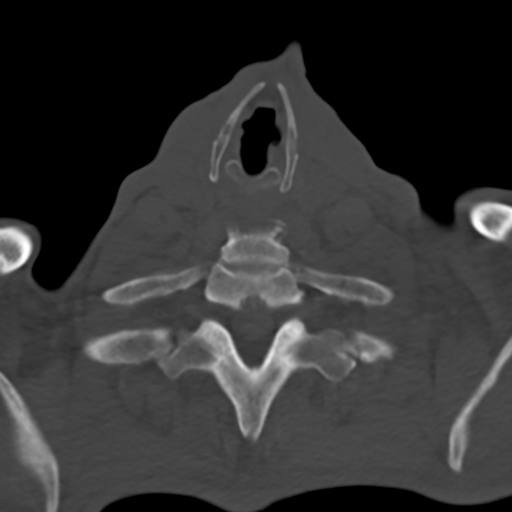
[im 27/88  bone]
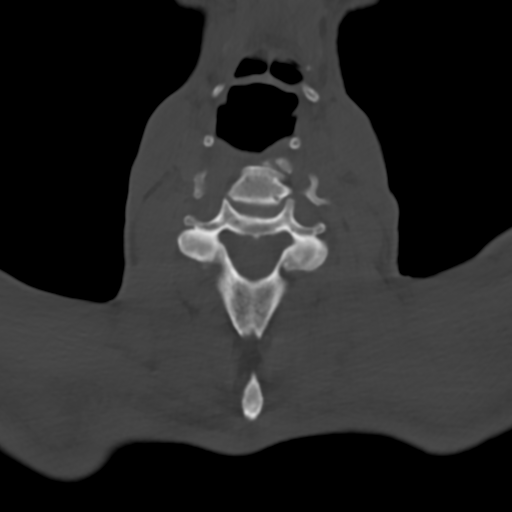
[im 47/88  bone]
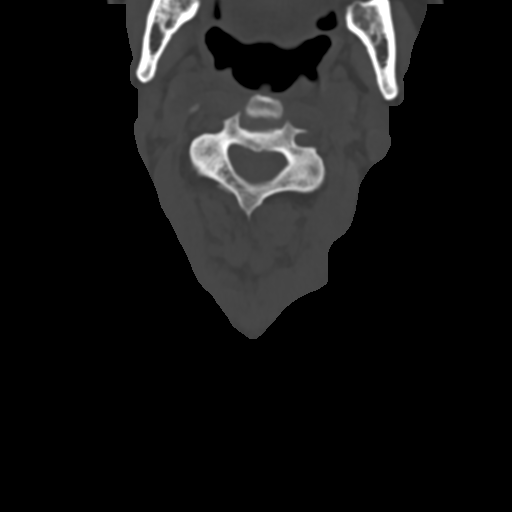
[im 61/88  bone]
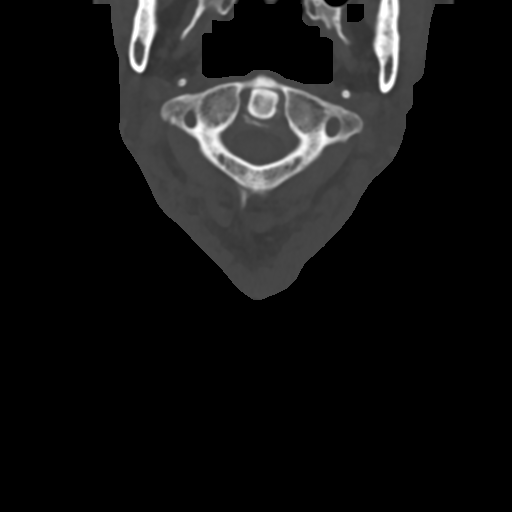
[im 74/88  soft-tissue]
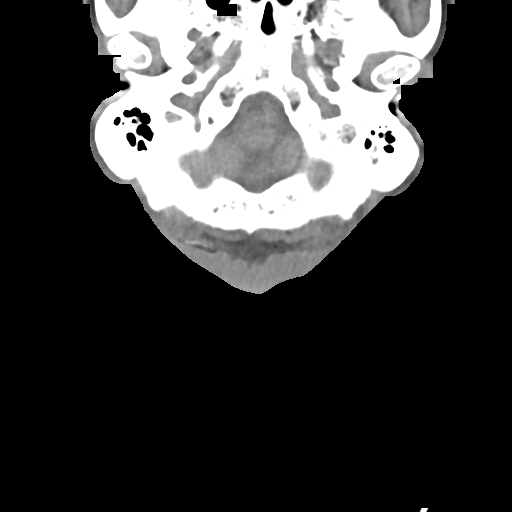
[im 74/88  bone]
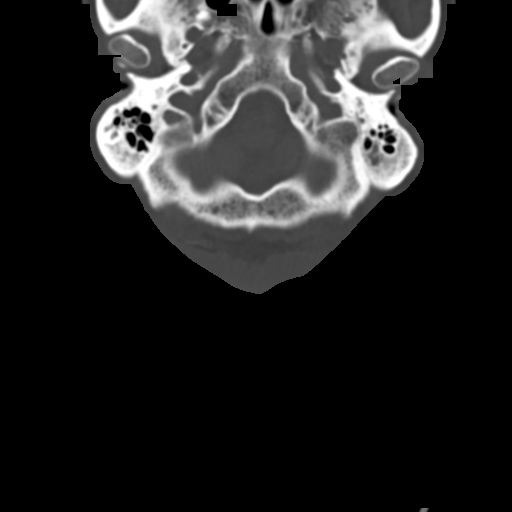

[Series 8: cor bone · coronal · 0.29mm/px · 3 of 106 slices shown]
[im 39/106  bone]
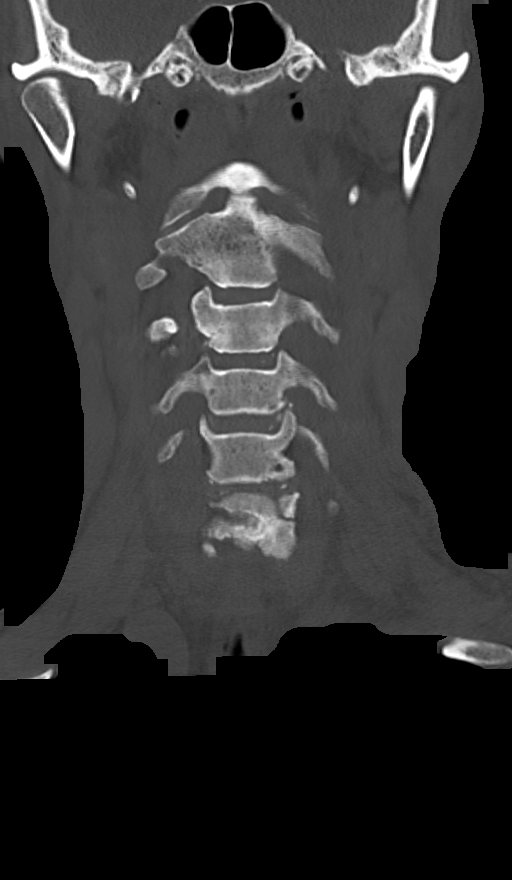
[im 48/106  bone]
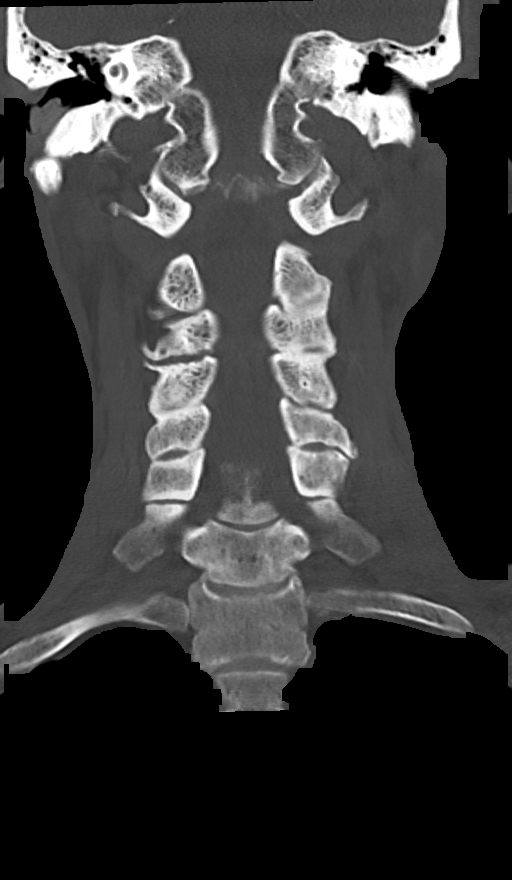
[im 58/106  bone]
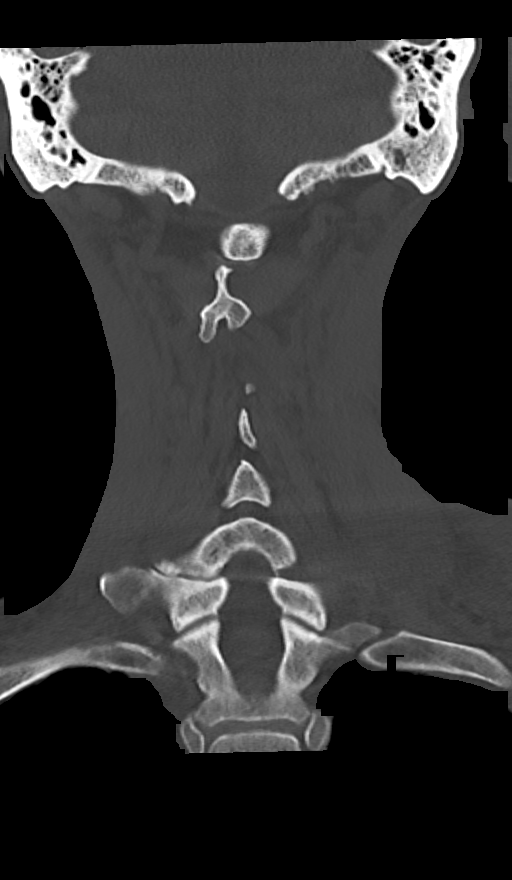

[13 of 33 positions shown; findings below may reference images not displayed]

FINDINGS: Alignment: Preserved.

Skull base and vertebrae: Vertebral body heights are maintained. No
acute fracture.

Soft tissues and spinal canal: No prevertebral fluid or swelling. No
visible canal hematoma.

Disc levels: Multilevel degenerative changes are stable over the
short interval.

Upper chest: No new finding.

Other: No new finding.
IMPRESSION: No acute cervical spine fracture.
